# Patient Record
Sex: Female | Born: 1935
Health system: Southern US, Community
[De-identification: ages and names within clinical notes are randomized; demographics above are authoritative.]

## PROBLEM LIST (undated history)

## (undated) DIAGNOSIS — F32A Depression, unspecified: Secondary | ICD-10-CM

## (undated) DIAGNOSIS — E079 Disorder of thyroid, unspecified: Secondary | ICD-10-CM

## (undated) DIAGNOSIS — E119 Type 2 diabetes mellitus without complications: Secondary | ICD-10-CM

## (undated) DIAGNOSIS — E039 Hypothyroidism, unspecified: Secondary | ICD-10-CM

## (undated) DIAGNOSIS — C801 Malignant (primary) neoplasm, unspecified: Secondary | ICD-10-CM

## (undated) DIAGNOSIS — M25319 Other instability, unspecified shoulder: Secondary | ICD-10-CM

## (undated) DIAGNOSIS — E785 Hyperlipidemia, unspecified: Secondary | ICD-10-CM

## (undated) DIAGNOSIS — I739 Peripheral vascular disease, unspecified: Secondary | ICD-10-CM

## (undated) DIAGNOSIS — M1990 Unspecified osteoarthritis, unspecified site: Secondary | ICD-10-CM

## (undated) DIAGNOSIS — I10 Essential (primary) hypertension: Secondary | ICD-10-CM

## (undated) DIAGNOSIS — T82868A Thrombosis of vascular prosthetic devices, implants and grafts, initial encounter: Secondary | ICD-10-CM

## (undated) DIAGNOSIS — F329 Major depressive disorder, single episode, unspecified: Secondary | ICD-10-CM

## (undated) HISTORY — DX: Essential (primary) hypertension: I10

## (undated) HISTORY — DX: Depression, unspecified: F32.A

## (undated) HISTORY — PX: EYE SURGERY: SHX253

## (undated) HISTORY — DX: Malignant (primary) neoplasm, unspecified: C80.1

## (undated) HISTORY — DX: Unspecified osteoarthritis, unspecified site: M19.90

## (undated) HISTORY — DX: Major depressive disorder, single episode, unspecified: F32.9

## (undated) HISTORY — PX: COLOSTOMY: SHX63

## (undated) HISTORY — PX: OTHER SURGICAL HISTORY: SHX169

## (undated) HISTORY — PX: CHOLECYSTECTOMY: SHX55

## (undated) HISTORY — PX: ABDOMINAL HYSTERECTOMY: SHX81

## (undated) HISTORY — PX: COLON SURGERY: SHX602

## (undated) HISTORY — DX: Other instability, unspecified shoulder: M25.319

## (undated) HISTORY — DX: Disorder of thyroid, unspecified: E07.9

## (undated) HISTORY — DX: Type 2 diabetes mellitus without complications: E11.9

## (undated) HISTORY — DX: Peripheral vascular disease, unspecified: I73.9

## (undated) HISTORY — PX: CARPAL TUNNEL RELEASE: SHX101

## (undated) HISTORY — PX: APPENDECTOMY: SHX54

## (undated) HISTORY — PX: VESICOVAGINAL FISTULA CLOSURE W/ TAH: SUR271

## (undated) HISTORY — DX: Hyperlipidemia, unspecified: E78.5

## (undated) HISTORY — PX: TONSILLECTOMY: SUR1361

---

## 1999-06-16 ENCOUNTER — Ambulatory Visit (HOSPITAL_COMMUNITY): Admission: RE | Admit: 1999-06-16 | Discharge: 1999-06-16 | Payer: Self-pay | Admitting: Surgery

## 1999-06-16 ENCOUNTER — Encounter: Payer: Self-pay | Admitting: Surgery

## 1999-06-23 ENCOUNTER — Encounter: Payer: Self-pay | Admitting: Surgery

## 1999-06-27 ENCOUNTER — Encounter: Payer: Self-pay | Admitting: Surgery

## 1999-06-27 ENCOUNTER — Inpatient Hospital Stay (HOSPITAL_COMMUNITY): Admission: RE | Admit: 1999-06-27 | Discharge: 1999-06-29 | Payer: Self-pay | Admitting: Surgery

## 2001-09-08 ENCOUNTER — Encounter: Admission: RE | Admit: 2001-09-08 | Discharge: 2001-09-08 | Payer: Self-pay | Admitting: Family Medicine

## 2001-09-08 ENCOUNTER — Encounter: Payer: Self-pay | Admitting: Family Medicine

## 2001-11-25 ENCOUNTER — Emergency Department (HOSPITAL_COMMUNITY): Admission: EM | Admit: 2001-11-25 | Discharge: 2001-11-25 | Payer: Self-pay | Admitting: Emergency Medicine

## 2001-11-25 ENCOUNTER — Encounter: Payer: Self-pay | Admitting: Emergency Medicine

## 2002-03-05 ENCOUNTER — Encounter: Payer: Self-pay | Admitting: Internal Medicine

## 2002-03-05 ENCOUNTER — Ambulatory Visit (HOSPITAL_COMMUNITY): Admission: RE | Admit: 2002-03-05 | Discharge: 2002-03-05 | Payer: Self-pay | Admitting: Internal Medicine

## 2002-10-02 ENCOUNTER — Encounter (HOSPITAL_COMMUNITY): Admission: RE | Admit: 2002-10-02 | Discharge: 2002-11-01 | Payer: Self-pay | Admitting: Rheumatology

## 2002-10-02 ENCOUNTER — Encounter: Payer: Self-pay | Admitting: Rheumatology

## 2002-10-31 ENCOUNTER — Encounter: Payer: Self-pay | Admitting: Rheumatology

## 2002-10-31 ENCOUNTER — Encounter: Admission: RE | Admit: 2002-10-31 | Discharge: 2002-10-31 | Payer: Self-pay | Admitting: Rheumatology

## 2002-12-01 ENCOUNTER — Encounter (HOSPITAL_COMMUNITY): Admission: RE | Admit: 2002-12-01 | Discharge: 2002-12-31 | Payer: Self-pay | Admitting: Rheumatology

## 2003-01-06 ENCOUNTER — Encounter (HOSPITAL_COMMUNITY): Admission: RE | Admit: 2003-01-06 | Discharge: 2003-02-05 | Payer: Self-pay | Admitting: Rheumatology

## 2003-03-10 ENCOUNTER — Observation Stay (HOSPITAL_COMMUNITY): Admission: EM | Admit: 2003-03-10 | Discharge: 2003-03-11 | Payer: Self-pay | Admitting: Emergency Medicine

## 2003-03-10 ENCOUNTER — Encounter: Payer: Self-pay | Admitting: Emergency Medicine

## 2003-09-14 ENCOUNTER — Ambulatory Visit (HOSPITAL_COMMUNITY): Admission: RE | Admit: 2003-09-14 | Discharge: 2003-09-14 | Payer: Self-pay | Admitting: Internal Medicine

## 2003-11-13 ENCOUNTER — Ambulatory Visit (HOSPITAL_COMMUNITY): Admission: RE | Admit: 2003-11-13 | Discharge: 2003-11-13 | Payer: Self-pay | Admitting: Family Medicine

## 2003-11-19 ENCOUNTER — Encounter: Payer: Self-pay | Admitting: Orthopedic Surgery

## 2003-12-07 ENCOUNTER — Encounter: Admission: RE | Admit: 2003-12-07 | Discharge: 2003-12-07 | Payer: Self-pay | Admitting: Orthopedic Surgery

## 2004-01-11 ENCOUNTER — Ambulatory Visit (HOSPITAL_COMMUNITY): Admission: RE | Admit: 2004-01-11 | Discharge: 2004-01-11 | Payer: Self-pay | Admitting: Surgery

## 2004-01-21 ENCOUNTER — Inpatient Hospital Stay (HOSPITAL_COMMUNITY): Admission: RE | Admit: 2004-01-21 | Discharge: 2004-01-25 | Payer: Self-pay | Admitting: Surgery

## 2004-01-22 ENCOUNTER — Encounter (INDEPENDENT_AMBULATORY_CARE_PROVIDER_SITE_OTHER): Payer: Self-pay | Admitting: Cardiology

## 2004-01-29 ENCOUNTER — Inpatient Hospital Stay (HOSPITAL_COMMUNITY): Admission: EM | Admit: 2004-01-29 | Discharge: 2004-02-05 | Payer: Self-pay | Admitting: Emergency Medicine

## 2004-07-29 ENCOUNTER — Inpatient Hospital Stay (HOSPITAL_COMMUNITY): Admission: RE | Admit: 2004-07-29 | Discharge: 2004-07-31 | Payer: Self-pay | Admitting: Orthopedic Surgery

## 2004-07-29 ENCOUNTER — Encounter: Payer: Self-pay | Admitting: Orthopedic Surgery

## 2004-08-02 ENCOUNTER — Encounter (HOSPITAL_COMMUNITY): Admission: RE | Admit: 2004-08-02 | Discharge: 2004-09-02 | Payer: Self-pay | Admitting: Orthopedic Surgery

## 2004-09-07 ENCOUNTER — Encounter (HOSPITAL_COMMUNITY): Admission: RE | Admit: 2004-09-07 | Discharge: 2004-10-07 | Payer: Self-pay | Admitting: Orthopedic Surgery

## 2004-12-28 ENCOUNTER — Ambulatory Visit: Payer: Self-pay | Admitting: Orthopedic Surgery

## 2004-12-29 ENCOUNTER — Encounter: Payer: Self-pay | Admitting: Orthopedic Surgery

## 2005-05-11 ENCOUNTER — Ambulatory Visit (HOSPITAL_COMMUNITY): Admission: RE | Admit: 2005-05-11 | Discharge: 2005-05-11 | Payer: Self-pay | Admitting: Family Medicine

## 2005-06-30 ENCOUNTER — Observation Stay (HOSPITAL_COMMUNITY): Admission: AD | Admit: 2005-06-30 | Discharge: 2005-07-01 | Payer: Self-pay | Admitting: Family Medicine

## 2005-07-06 ENCOUNTER — Emergency Department (HOSPITAL_COMMUNITY): Admission: EM | Admit: 2005-07-06 | Discharge: 2005-07-06 | Payer: Self-pay | Admitting: Emergency Medicine

## 2005-07-25 ENCOUNTER — Encounter: Payer: Self-pay | Admitting: Family Medicine

## 2005-07-25 ENCOUNTER — Inpatient Hospital Stay (HOSPITAL_COMMUNITY): Admission: AD | Admit: 2005-07-25 | Discharge: 2005-08-03 | Payer: Self-pay | Admitting: Family Medicine

## 2005-08-02 ENCOUNTER — Encounter: Payer: Self-pay | Admitting: General Surgery

## 2005-08-02 ENCOUNTER — Encounter (INDEPENDENT_AMBULATORY_CARE_PROVIDER_SITE_OTHER): Payer: Self-pay | Admitting: *Deleted

## 2005-08-17 ENCOUNTER — Ambulatory Visit (HOSPITAL_COMMUNITY): Admission: RE | Admit: 2005-08-17 | Discharge: 2005-08-17 | Payer: Self-pay | Admitting: General Surgery

## 2005-08-28 ENCOUNTER — Inpatient Hospital Stay (HOSPITAL_COMMUNITY): Admission: AD | Admit: 2005-08-28 | Discharge: 2005-10-20 | Payer: Self-pay | Admitting: General Surgery

## 2005-08-29 ENCOUNTER — Encounter (INDEPENDENT_AMBULATORY_CARE_PROVIDER_SITE_OTHER): Payer: Self-pay | Admitting: General Surgery

## 2005-09-22 ENCOUNTER — Ambulatory Visit: Payer: Self-pay | Admitting: Oncology

## 2005-10-02 ENCOUNTER — Ambulatory Visit: Payer: Self-pay | Admitting: *Deleted

## 2005-10-05 ENCOUNTER — Ambulatory Visit: Payer: Self-pay | Admitting: Pulmonary Disease

## 2005-10-06 ENCOUNTER — Encounter: Payer: Self-pay | Admitting: Critical Care Medicine

## 2005-11-01 ENCOUNTER — Inpatient Hospital Stay (HOSPITAL_COMMUNITY): Admission: RE | Admit: 2005-11-01 | Discharge: 2005-11-11 | Payer: Self-pay | Admitting: General Surgery

## 2005-11-20 ENCOUNTER — Inpatient Hospital Stay (HOSPITAL_COMMUNITY): Admission: AD | Admit: 2005-11-20 | Discharge: 2005-12-06 | Payer: Self-pay | Admitting: General Surgery

## 2005-12-15 ENCOUNTER — Ambulatory Visit (HOSPITAL_COMMUNITY): Admission: RE | Admit: 2005-12-15 | Discharge: 2005-12-15 | Payer: Self-pay | Admitting: Family Medicine

## 2005-12-18 ENCOUNTER — Inpatient Hospital Stay (HOSPITAL_COMMUNITY): Admission: EM | Admit: 2005-12-18 | Discharge: 2005-12-26 | Payer: Self-pay | Admitting: Emergency Medicine

## 2005-12-18 ENCOUNTER — Ambulatory Visit: Payer: Self-pay | Admitting: *Deleted

## 2005-12-19 ENCOUNTER — Ambulatory Visit: Payer: Self-pay | Admitting: Oncology

## 2006-01-02 ENCOUNTER — Encounter: Admission: RE | Admit: 2006-01-02 | Discharge: 2006-01-02 | Payer: Self-pay | Admitting: Oncology

## 2006-01-02 ENCOUNTER — Ambulatory Visit (HOSPITAL_COMMUNITY): Payer: Self-pay | Admitting: Oncology

## 2006-01-02 ENCOUNTER — Encounter (HOSPITAL_COMMUNITY): Admission: RE | Admit: 2006-01-02 | Discharge: 2006-02-01 | Payer: Self-pay | Admitting: Oncology

## 2006-01-13 ENCOUNTER — Inpatient Hospital Stay (HOSPITAL_COMMUNITY): Admission: AD | Admit: 2006-01-13 | Discharge: 2006-01-15 | Payer: Self-pay | Admitting: Family Medicine

## 2006-02-07 ENCOUNTER — Encounter (HOSPITAL_COMMUNITY): Admission: RE | Admit: 2006-02-07 | Discharge: 2006-03-09 | Payer: Self-pay | Admitting: Oncology

## 2006-02-07 ENCOUNTER — Encounter: Admission: RE | Admit: 2006-02-07 | Discharge: 2006-02-07 | Payer: Self-pay | Admitting: Oncology

## 2006-02-21 ENCOUNTER — Ambulatory Visit (HOSPITAL_COMMUNITY): Payer: Self-pay | Admitting: Oncology

## 2006-03-14 ENCOUNTER — Encounter: Admission: RE | Admit: 2006-03-14 | Discharge: 2006-03-14 | Payer: Self-pay | Admitting: Oncology

## 2006-03-14 ENCOUNTER — Encounter (HOSPITAL_COMMUNITY): Admission: RE | Admit: 2006-03-14 | Discharge: 2006-04-13 | Payer: Self-pay | Admitting: Oncology

## 2006-04-11 ENCOUNTER — Ambulatory Visit (HOSPITAL_COMMUNITY): Payer: Self-pay | Admitting: Oncology

## 2006-05-09 ENCOUNTER — Encounter: Admission: RE | Admit: 2006-05-09 | Discharge: 2006-05-09 | Payer: Self-pay | Admitting: Oncology

## 2006-05-09 ENCOUNTER — Encounter (HOSPITAL_COMMUNITY): Admission: RE | Admit: 2006-05-09 | Discharge: 2006-06-08 | Payer: Self-pay | Admitting: Oncology

## 2006-05-18 ENCOUNTER — Ambulatory Visit (HOSPITAL_COMMUNITY): Admission: RE | Admit: 2006-05-18 | Discharge: 2006-05-18 | Payer: Self-pay | Admitting: General Surgery

## 2006-06-07 ENCOUNTER — Ambulatory Visit (HOSPITAL_COMMUNITY): Payer: Self-pay | Admitting: Oncology

## 2006-07-19 ENCOUNTER — Encounter (HOSPITAL_COMMUNITY): Admission: RE | Admit: 2006-07-19 | Discharge: 2006-08-18 | Payer: Self-pay | Admitting: Oncology

## 2006-07-19 ENCOUNTER — Encounter: Admission: RE | Admit: 2006-07-19 | Discharge: 2006-07-19 | Payer: Self-pay | Admitting: Oncology

## 2006-08-30 ENCOUNTER — Encounter (HOSPITAL_COMMUNITY): Admission: RE | Admit: 2006-08-30 | Discharge: 2006-09-01 | Payer: Self-pay | Admitting: Oncology

## 2006-08-30 ENCOUNTER — Ambulatory Visit (HOSPITAL_COMMUNITY): Payer: Self-pay | Admitting: Oncology

## 2006-08-30 ENCOUNTER — Encounter: Admission: RE | Admit: 2006-08-30 | Discharge: 2006-09-01 | Payer: Self-pay | Admitting: Oncology

## 2006-08-31 ENCOUNTER — Ambulatory Visit (HOSPITAL_COMMUNITY): Admission: RE | Admit: 2006-08-31 | Discharge: 2006-08-31 | Payer: Self-pay | Admitting: General Surgery

## 2006-09-27 ENCOUNTER — Encounter (HOSPITAL_COMMUNITY): Admission: RE | Admit: 2006-09-27 | Discharge: 2006-10-27 | Payer: Self-pay | Admitting: Oncology

## 2006-09-27 ENCOUNTER — Encounter: Admission: RE | Admit: 2006-09-27 | Discharge: 2006-09-27 | Payer: Self-pay | Admitting: Oncology

## 2006-10-23 ENCOUNTER — Ambulatory Visit (HOSPITAL_COMMUNITY): Payer: Self-pay | Admitting: Oncology

## 2006-11-28 ENCOUNTER — Encounter (HOSPITAL_COMMUNITY): Admission: RE | Admit: 2006-11-28 | Discharge: 2006-12-03 | Payer: Self-pay | Admitting: Oncology

## 2007-03-15 IMAGING — CR DG ABDOMEN 2V
2 series · 2 of 2 positions shown · non-contrast
Comparison: none

CLINICAL DATA: Abdominal pain. 
 ABDOMEN ? 2 VIEW:
 The bowel gas pattern is normal.  There is no evidence of dilated bowel loops or air-fluid levels.  There is no evidence of free intraperitoneal air.  Surgical clips are seen in the right upper quadrant from prior cholecystectomy.

[view not recorded (1 of 2)]
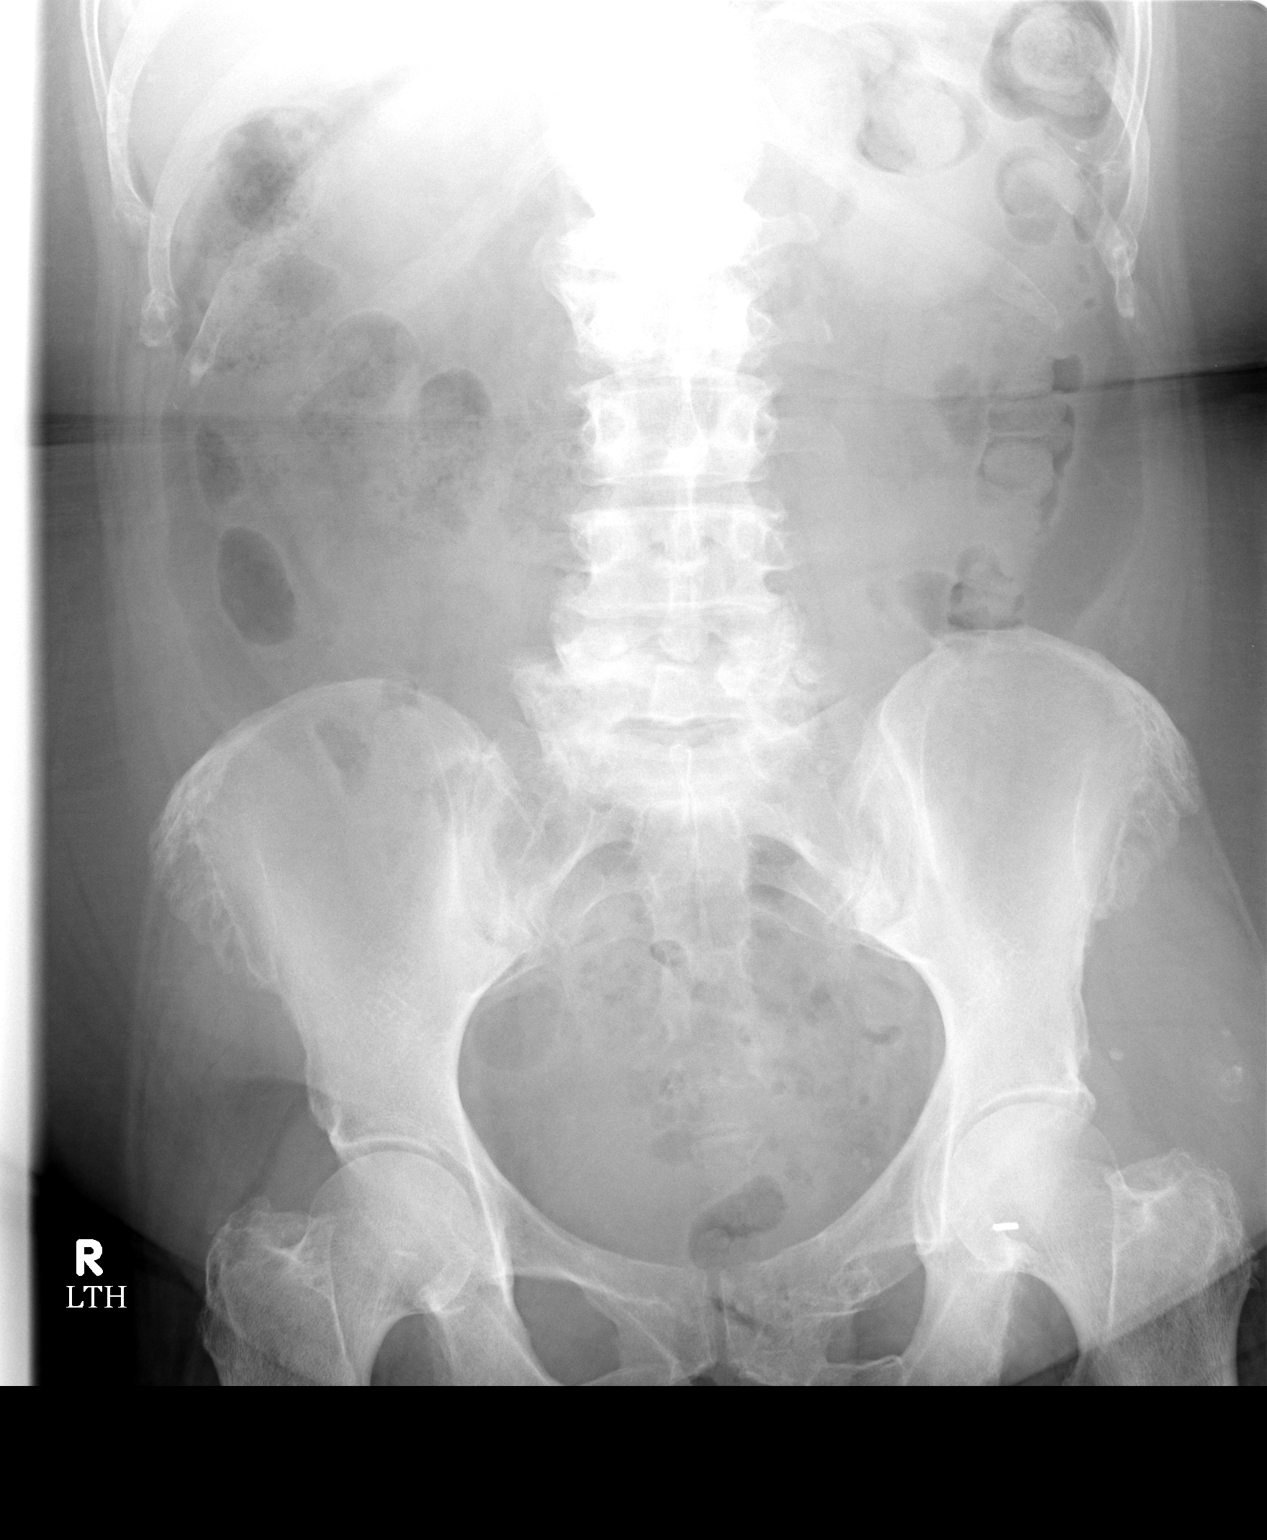

[view not recorded (2 of 2)]
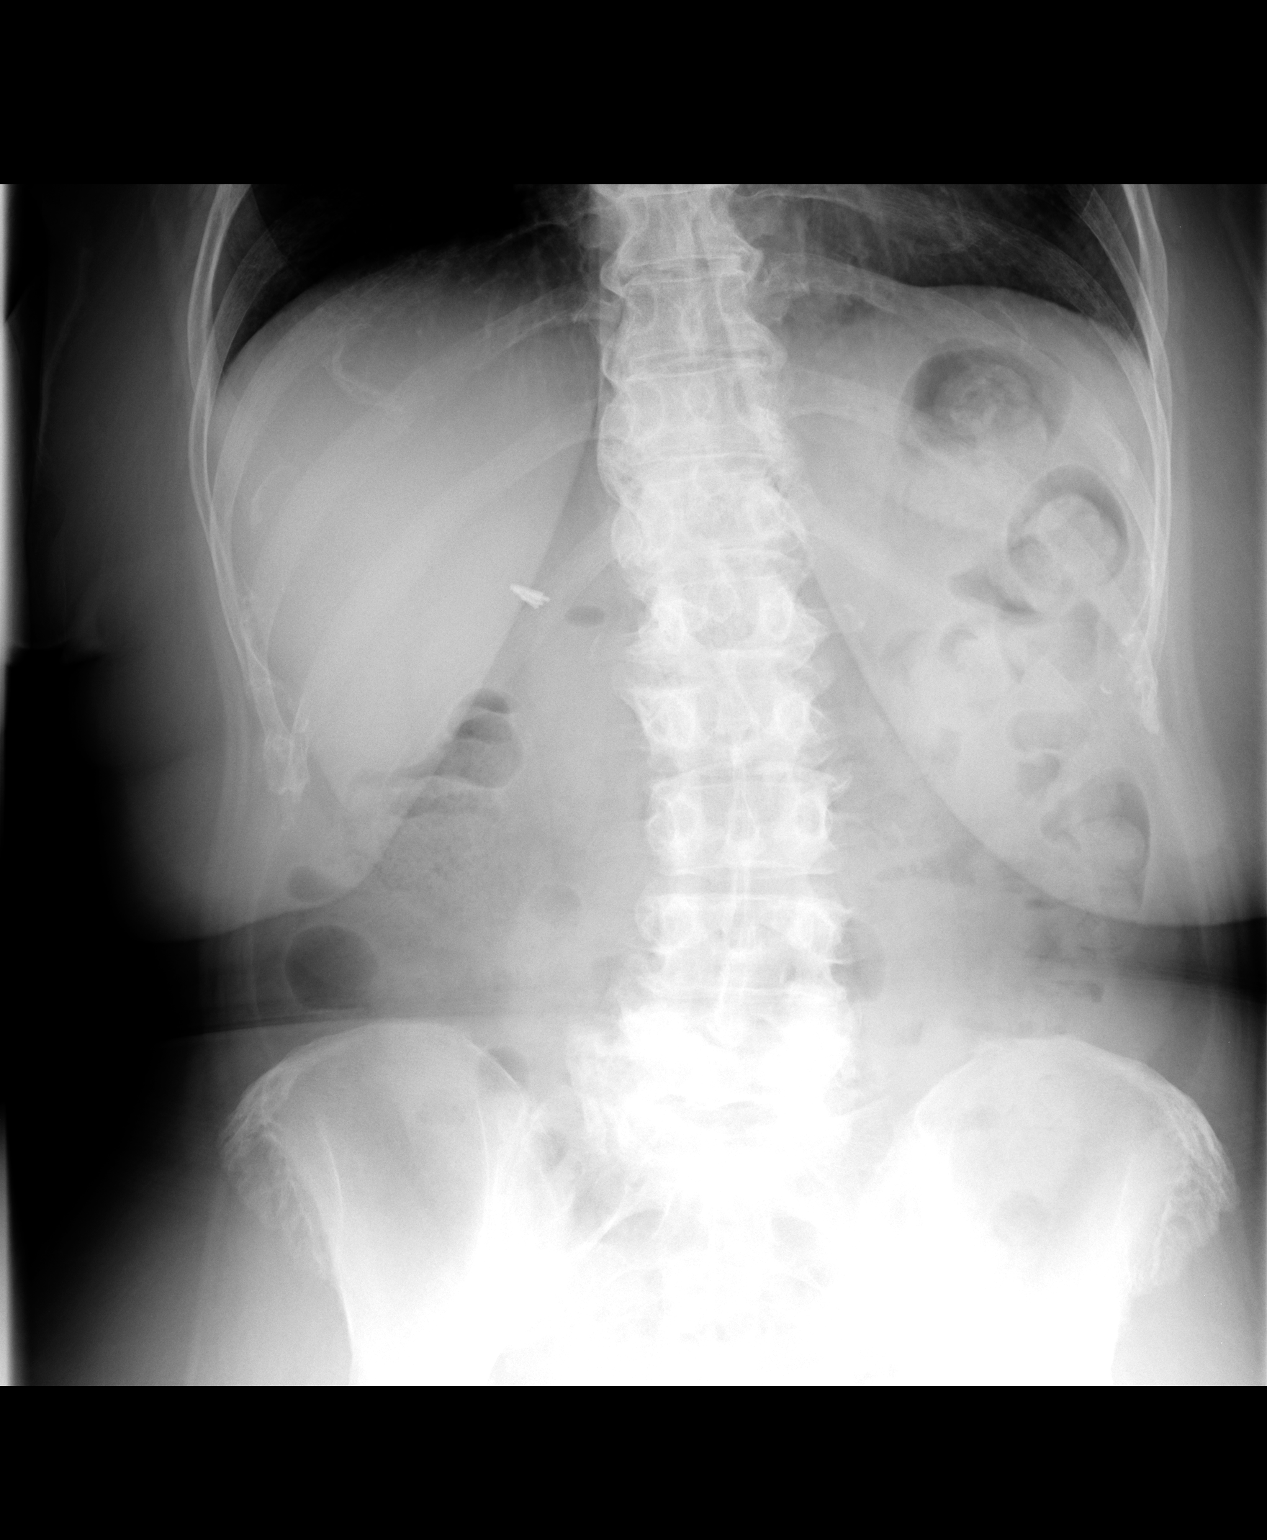

[2 of 2 positions shown; findings below may reference images not displayed]

IMPRESSION: No acute findings.

## 2007-03-28 ENCOUNTER — Ambulatory Visit (HOSPITAL_COMMUNITY): Payer: Self-pay | Admitting: Oncology

## 2007-03-28 ENCOUNTER — Encounter (HOSPITAL_COMMUNITY): Admission: RE | Admit: 2007-03-28 | Discharge: 2007-04-27 | Payer: Self-pay | Admitting: Oncology

## 2007-05-26 IMAGING — CR DG CHEST 1V PORT
1 series · 1 of 1 positions shown · non-contrast
Comparison: 09/09/05.

CLINICAL DATA: Rule out infiltrate. 
 PORTABLE CHEST ? 1 VIEW:

[view not recorded]
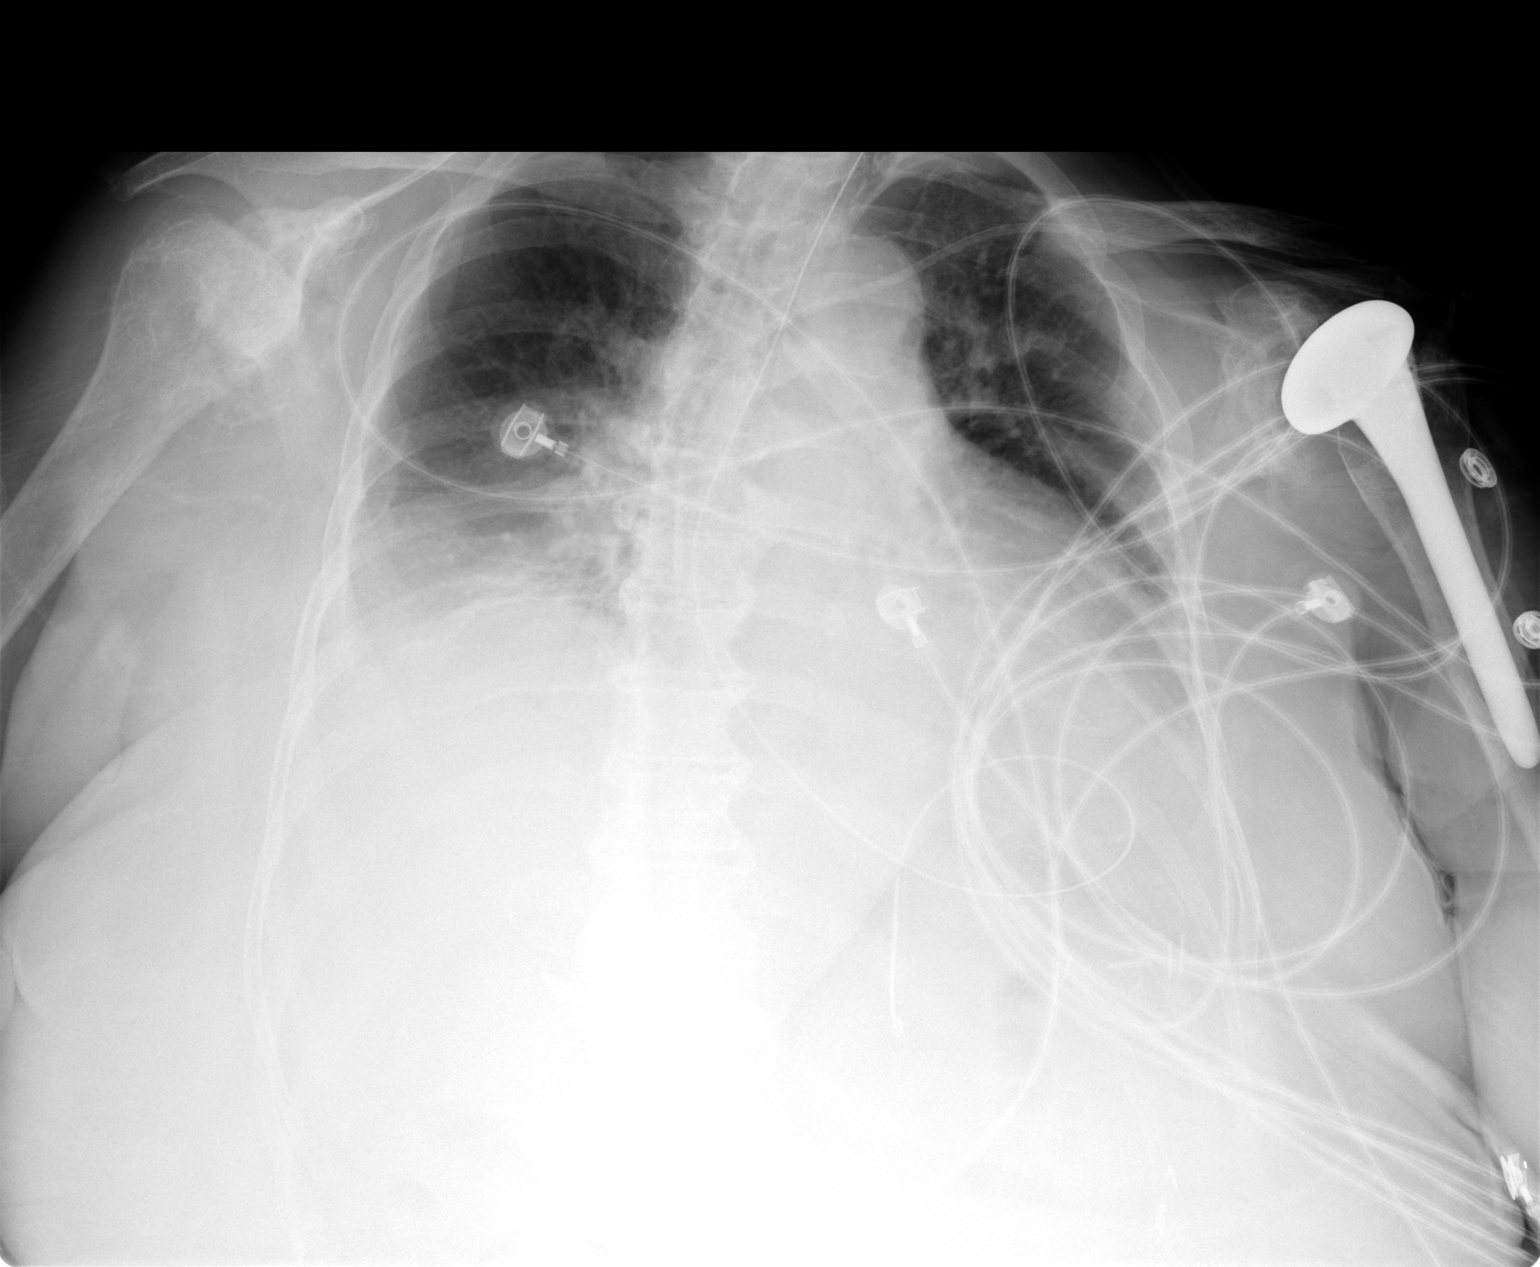

[1 of 1 positions shown; findings below may reference images not displayed]

FINDINGS: NG tube is coiled within the stomach.  There is a left subclavian line with tip in the projection of the cavoatrial junction/right atrium.  
 There is cardiomegaly.  Blunting of left costophrenic angle may be due to an effusion.  There is prominent bibasilar atelectasis.
IMPRESSION: 1.  Cardiomegaly and left effusion. 
 2.  Prominent bibasilar atelectasis.
 3.  Left subclavian central venous catheter tip at cavoatrial junction or right atrium.

## 2007-05-26 IMAGING — CR DG ABDOMEN 1V
2 series · 2 of 2 positions shown · non-contrast
Comparison: 09/09/05.

CLINICAL DATA: Nausea, vomiting, and diverticulitis.  Evaluate for ileus.
 ABDOMEN ? 1 VIEW:

[view not recorded (1 of 2)]
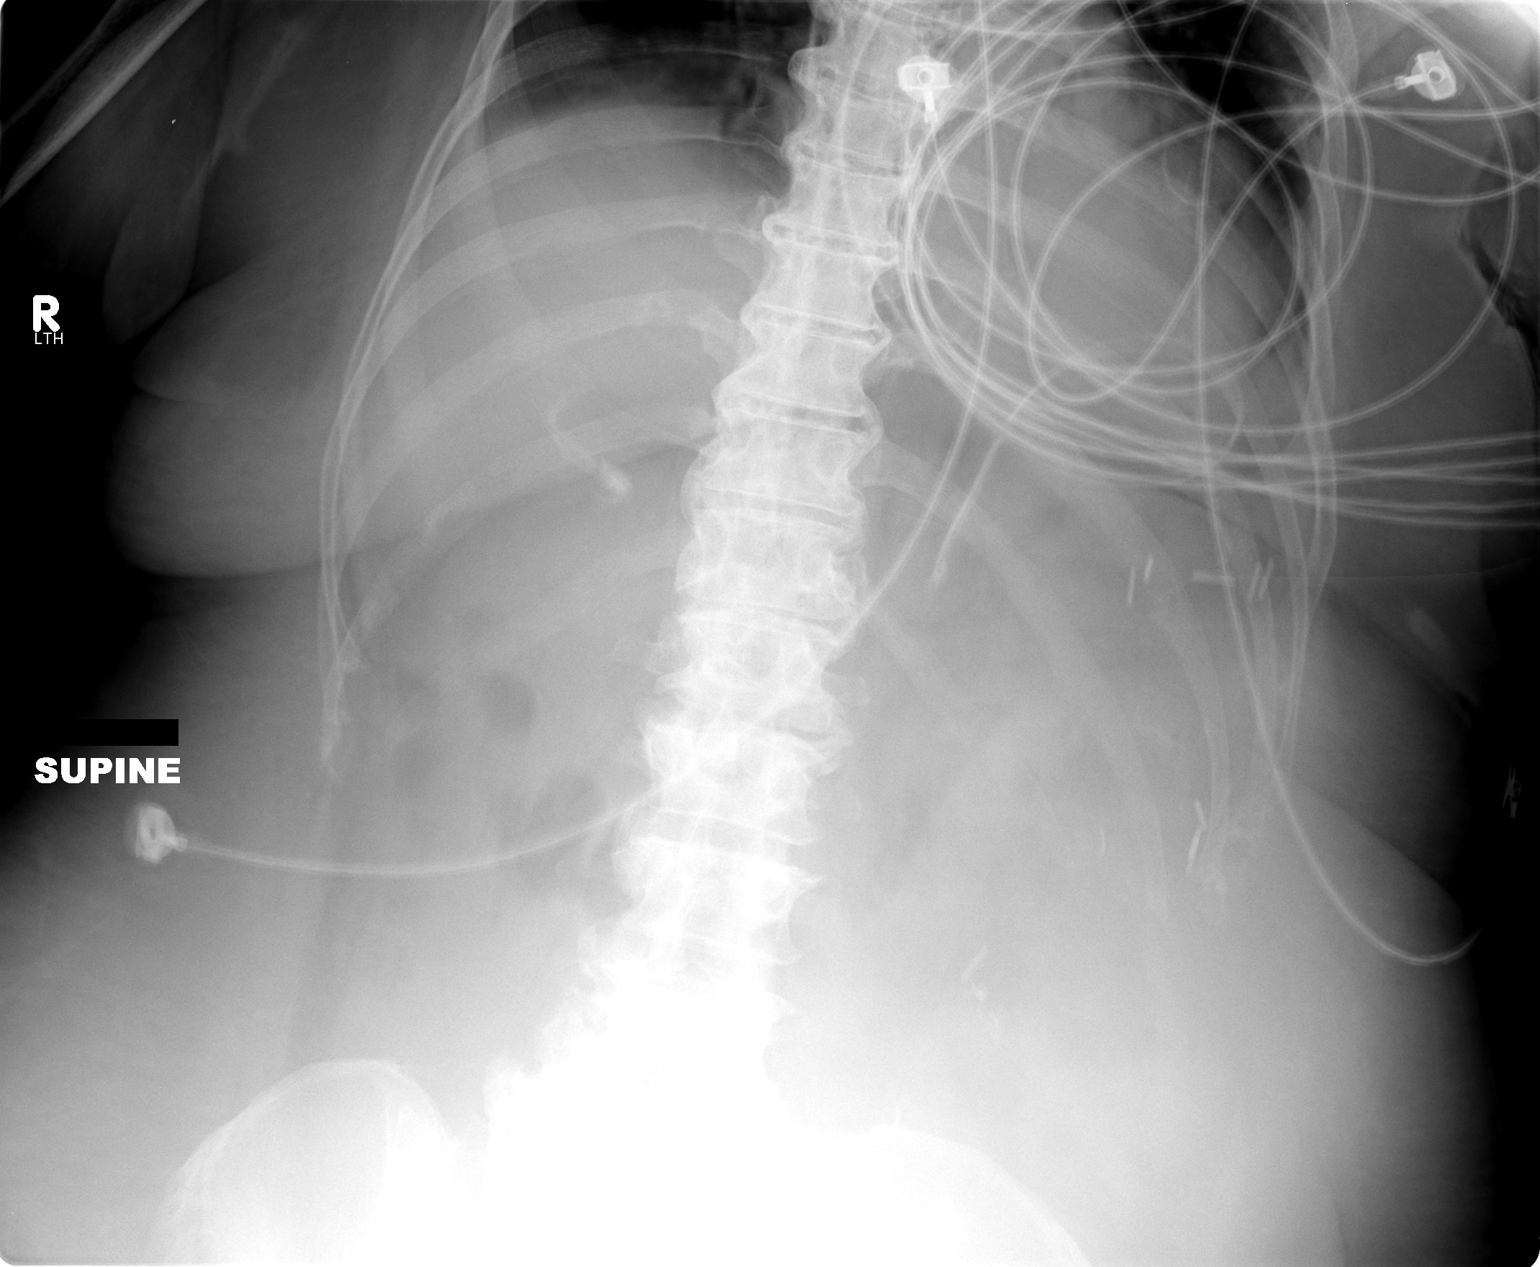

[view not recorded (2 of 2)]
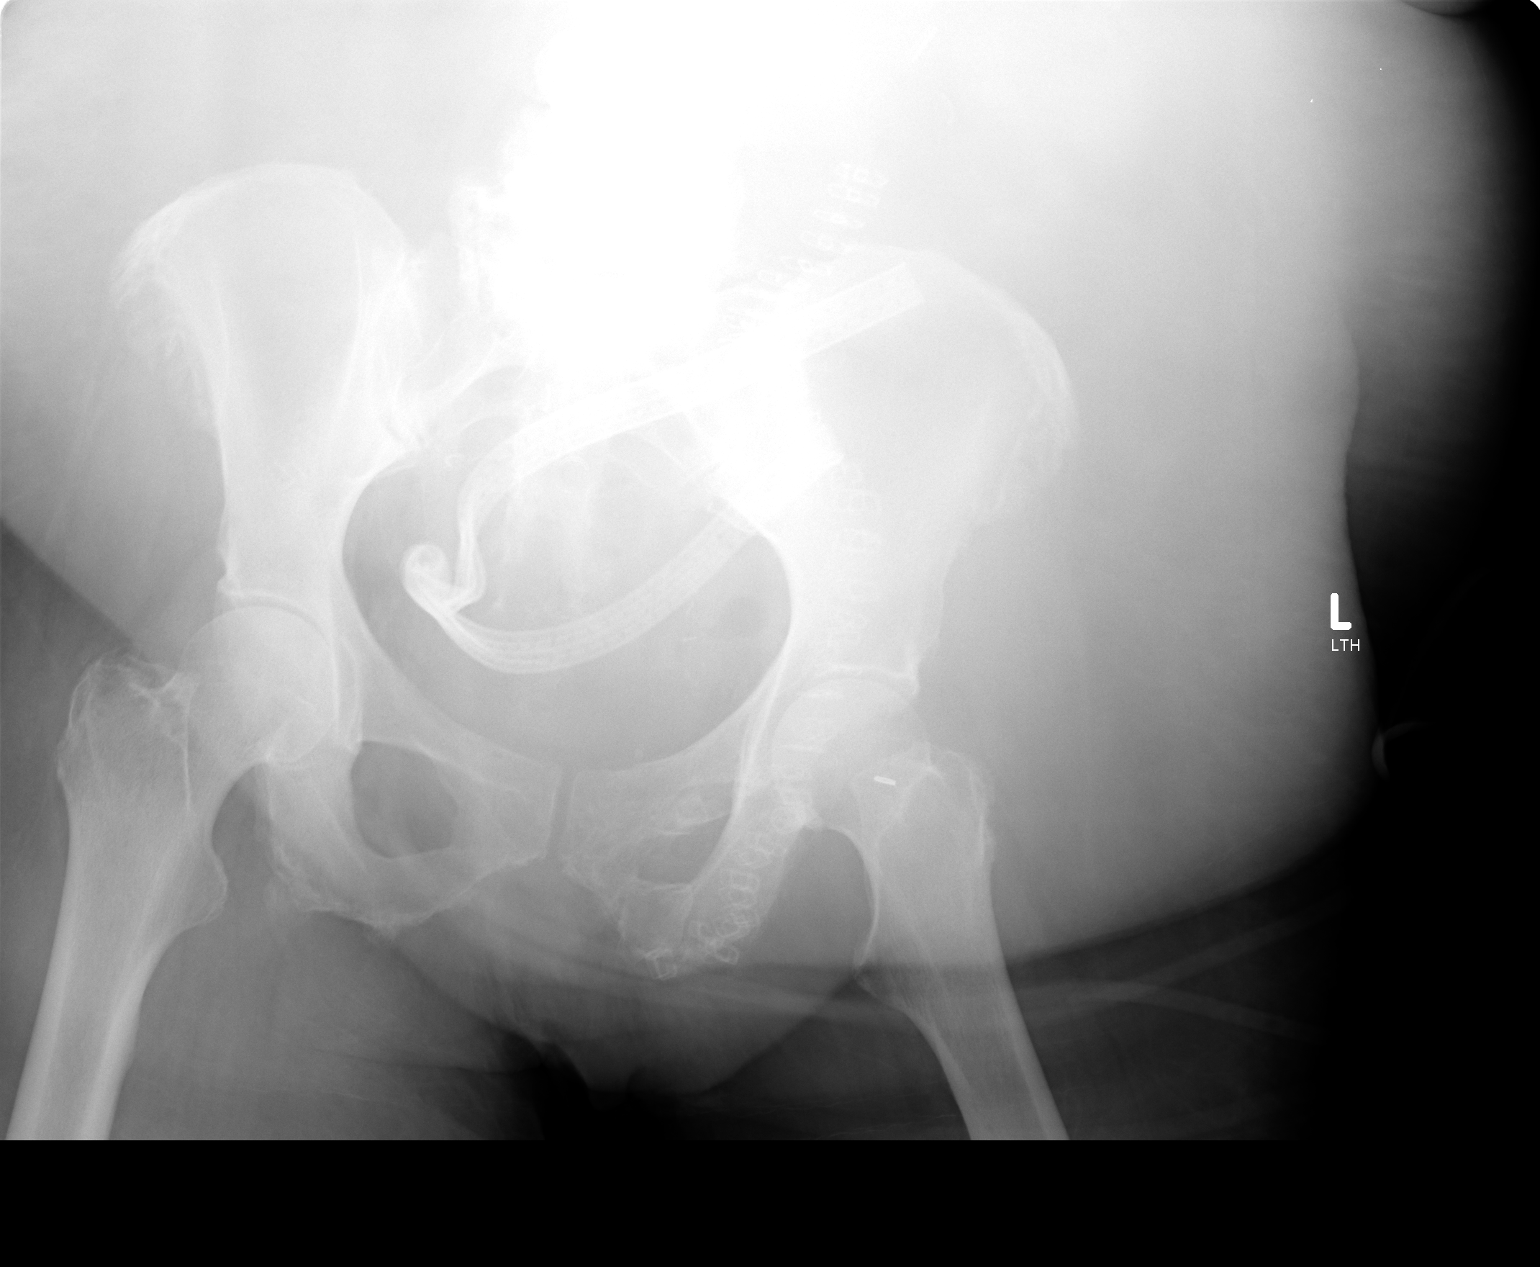

[2 of 2 positions shown; findings below may reference images not displayed]

FINDINGS: There is a surgical drain in the projection of the pelvis.  Numerous surgical clips are overlying the left lower quadrant.  There is a paucity of bowel gas which limits evaluation.  No evidence for small or large bowel ileus is noted.  A nasogastric tube is identified coiled within the body of the stomach.
IMPRESSION: No bowel obstruction.  No evidence for free air.  Surgical drain stable in the left lower quadrant/pelvis.

## 2007-11-05 ENCOUNTER — Encounter (HOSPITAL_BASED_OUTPATIENT_CLINIC_OR_DEPARTMENT_OTHER): Admission: RE | Admit: 2007-11-05 | Discharge: 2007-12-10 | Payer: Self-pay | Admitting: Surgery

## 2007-12-18 ENCOUNTER — Ambulatory Visit (HOSPITAL_COMMUNITY): Admission: RE | Admit: 2007-12-18 | Discharge: 2007-12-18 | Payer: Self-pay | Admitting: Family Medicine

## 2007-12-21 ENCOUNTER — Emergency Department (HOSPITAL_COMMUNITY): Admission: EM | Admit: 2007-12-21 | Discharge: 2007-12-21 | Payer: Self-pay | Admitting: Emergency Medicine

## 2007-12-24 DIAGNOSIS — Z8679 Personal history of other diseases of the circulatory system: Secondary | ICD-10-CM | POA: Insufficient documentation

## 2007-12-25 ENCOUNTER — Telehealth: Payer: Self-pay | Admitting: Orthopedic Surgery

## 2007-12-25 ENCOUNTER — Ambulatory Visit: Payer: Self-pay | Admitting: Orthopedic Surgery

## 2007-12-25 DIAGNOSIS — M1710 Unilateral primary osteoarthritis, unspecified knee: Secondary | ICD-10-CM

## 2007-12-25 DIAGNOSIS — IMO0002 Reserved for concepts with insufficient information to code with codable children: Secondary | ICD-10-CM | POA: Insufficient documentation

## 2007-12-25 DIAGNOSIS — M25569 Pain in unspecified knee: Secondary | ICD-10-CM | POA: Insufficient documentation

## 2007-12-26 ENCOUNTER — Encounter: Payer: Self-pay | Admitting: Orthopedic Surgery

## 2008-06-24 ENCOUNTER — Encounter: Payer: Self-pay | Admitting: Orthopedic Surgery

## 2008-07-30 ENCOUNTER — Telehealth: Payer: Self-pay | Admitting: Orthopedic Surgery

## 2008-12-18 ENCOUNTER — Encounter: Admission: RE | Admit: 2008-12-18 | Discharge: 2008-12-18 | Payer: Self-pay | Admitting: Orthopedic Surgery

## 2009-01-07 ENCOUNTER — Encounter: Payer: Self-pay | Admitting: Emergency Medicine

## 2009-01-07 ENCOUNTER — Inpatient Hospital Stay (HOSPITAL_COMMUNITY): Admission: EM | Admit: 2009-01-07 | Discharge: 2009-01-11 | Payer: Self-pay | Admitting: Internal Medicine

## 2009-01-07 ENCOUNTER — Ambulatory Visit: Payer: Self-pay | Admitting: Vascular Surgery

## 2009-01-07 ENCOUNTER — Encounter: Payer: Self-pay | Admitting: Vascular Surgery

## 2009-01-08 ENCOUNTER — Encounter: Payer: Self-pay | Admitting: Vascular Surgery

## 2009-01-20 ENCOUNTER — Ambulatory Visit: Payer: Self-pay | Admitting: Vascular Surgery

## 2009-01-27 ENCOUNTER — Ambulatory Visit: Payer: Self-pay | Admitting: Vascular Surgery

## 2009-02-03 ENCOUNTER — Ambulatory Visit: Payer: Self-pay | Admitting: Vascular Surgery

## 2009-02-11 ENCOUNTER — Emergency Department (HOSPITAL_COMMUNITY): Admission: EM | Admit: 2009-02-11 | Discharge: 2009-02-11 | Payer: Self-pay | Admitting: Emergency Medicine

## 2009-02-27 ENCOUNTER — Emergency Department (HOSPITAL_COMMUNITY): Admission: EM | Admit: 2009-02-27 | Discharge: 2009-02-27 | Payer: Self-pay | Admitting: Emergency Medicine

## 2009-04-07 ENCOUNTER — Ambulatory Visit: Payer: Self-pay | Admitting: Vascular Surgery

## 2009-04-14 ENCOUNTER — Ambulatory Visit: Payer: Self-pay | Admitting: Vascular Surgery

## 2009-04-20 ENCOUNTER — Ambulatory Visit (HOSPITAL_COMMUNITY): Admission: RE | Admit: 2009-04-20 | Discharge: 2009-04-20 | Payer: Self-pay | Admitting: Surgery

## 2009-04-20 ENCOUNTER — Ambulatory Visit: Payer: Self-pay | Admitting: Surgery

## 2009-05-12 ENCOUNTER — Ambulatory Visit: Payer: Self-pay | Admitting: Vascular Surgery

## 2009-07-14 ENCOUNTER — Ambulatory Visit: Payer: Self-pay | Admitting: Vascular Surgery

## 2009-08-06 ENCOUNTER — Ambulatory Visit (HOSPITAL_COMMUNITY): Admission: RE | Admit: 2009-08-06 | Discharge: 2009-08-06 | Payer: Self-pay | Admitting: Vascular Surgery

## 2009-08-11 ENCOUNTER — Ambulatory Visit: Payer: Self-pay | Admitting: Vascular Surgery

## 2009-10-06 ENCOUNTER — Ambulatory Visit: Payer: Self-pay | Admitting: Vascular Surgery

## 2009-10-20 ENCOUNTER — Ambulatory Visit: Payer: Self-pay | Admitting: Vascular Surgery

## 2010-02-09 ENCOUNTER — Ambulatory Visit: Payer: Self-pay | Admitting: Vascular Surgery

## 2010-04-06 ENCOUNTER — Ambulatory Visit: Payer: Self-pay | Admitting: Orthopedic Surgery

## 2010-04-06 DIAGNOSIS — M2350 Chronic instability of knee, unspecified knee: Secondary | ICD-10-CM | POA: Insufficient documentation

## 2010-04-11 ENCOUNTER — Ambulatory Visit: Payer: Self-pay | Admitting: Orthopedic Surgery

## 2010-04-11 DIAGNOSIS — Z96619 Presence of unspecified artificial shoulder joint: Secondary | ICD-10-CM | POA: Insufficient documentation

## 2010-04-11 DIAGNOSIS — M25519 Pain in unspecified shoulder: Secondary | ICD-10-CM | POA: Insufficient documentation

## 2010-04-11 DIAGNOSIS — M25819 Other specified joint disorders, unspecified shoulder: Secondary | ICD-10-CM | POA: Insufficient documentation

## 2010-04-11 DIAGNOSIS — M7580 Other shoulder lesions, unspecified shoulder: Secondary | ICD-10-CM

## 2010-05-31 ENCOUNTER — Ambulatory Visit: Payer: Self-pay | Admitting: Orthopedic Surgery

## 2010-06-03 ENCOUNTER — Ambulatory Visit: Payer: Self-pay | Admitting: Vascular Surgery

## 2010-09-01 ENCOUNTER — Ambulatory Visit: Payer: Self-pay | Admitting: Orthopedic Surgery

## 2010-12-13 ENCOUNTER — Ambulatory Visit
Admission: RE | Admit: 2010-12-13 | Discharge: 2010-12-13 | Payer: Self-pay | Source: Home / Self Care | Attending: Orthopedic Surgery | Admitting: Orthopedic Surgery

## 2010-12-14 ENCOUNTER — Ambulatory Visit
Admission: RE | Admit: 2010-12-14 | Discharge: 2010-12-14 | Payer: Self-pay | Source: Home / Self Care | Attending: Vascular Surgery | Admitting: Vascular Surgery

## 2010-12-22 ENCOUNTER — Ambulatory Visit
Admission: RE | Admit: 2010-12-22 | Discharge: 2010-12-22 | Payer: Self-pay | Source: Home / Self Care | Attending: Vascular Surgery | Admitting: Vascular Surgery

## 2010-12-23 NOTE — Assessment & Plan Note (Signed)
OFFICE VISIT  [name], [name] DOB:  Jul 01, 1926                                       12/22/2010 [id number]  The patient returns for followup today.  She has previously undergone multiple revascularizations of both lower extremities.  She previously had a left fem-pop bypass done by Dr. Nils Pyle several years ago and then recently underwent thrombectomy of her left external iliac artery and redo left femoral to below knee popliteal bypass with a vein graft in February of 2010.  She also had an angioplasty of this in May of 2010. She has also previously had a right fem-pop bypass with primary runoff via the anterior tibial artery.  She returns today for further followup.  She states that intermittently she has some left leg numbness but this is intermittent in nature and not really bothersome to her.  She states her right leg is doing fine. She is minimally ambulatory and only moves about the house some.  She has no claudication symptoms.  She has no rest pain.  She has no history of nonhealing ulcerations recently.  REVIEW OF SYSTEMS:  She has had some recent weight loss. MUSCULOSKELETAL:  She has multiple joint arthritis.  CHRONIC MEDICAL PROBLEMS:  Include diabetes, hypertension, elevated cholesterol and these are currently followed by her primary physician.  MEDICATIONS:  Include diltiazem, Synthroid, simvastatin, Lodine, iron, multivitamin.  ALLERGIES:  She has no known drug allergies.  PHYSICAL EXAM:  Vital signs:  Blood pressure is 138/79 in the left arm, heart rate 49 and regular, respirations 20.  Lower extremities:  She has 2+ femoral pulses bilaterally.  She has 2+ popliteal pulses bilaterally. She has absent pedal pulses bilaterally.  She has no open ulcers on the feet.  I reviewed her noninvasive vascular exam which showed patent bypass grafts bilaterally with possibly some progressive narrowing of the posterior tibial artery on  the right side.  However, her symptoms have essentially not changed and she is minimal ambulatory without claudication symptoms and widely patent grafts.  I believe at this point continued observation would be best and we will schedule her for continued noninvasive surveillance of her bypass grafts.    [name], MD Electronically Signed  CEF/MEDQ  D:  12/22/2010  T:  12/23/2010  Job:  [id number]  cc:   Lucianne Lei, M.D.

## 2010-12-24 NOTE — Procedures (Unsigned)
BYPASS GRAFT EVALUATION  INDICATION:  Followup left fem pop graft  HISTORY: Diabetes:  yes Cardiac:  no Hypertension:  yes Smoking:  no Previous Surgery:  Left iliac thrombectomy and fem pop graft in 01/07/2009; right fem pop graft in 2005 by Dr. Nils Pyle  SINGLE LEVEL ARTERIAL EXAM                              RIGHT              LEFT Brachial:                    160                167 Anterior tibial:             147                129 Posterior tibial:            64                 133 Peroneal: Ankle/brachial index:        0.88 (AT), 0.38 (PT)                   0.80  PREVIOUS ABI:  Date: 02/09/2010  RIGHT:  1.01  LEFT:  1.0  LOWER EXTREMITY BYPASS GRAFT DUPLEX EXAM:  DUPLEX:  Left fem pop graft patent with triphasic and biphasic waveforms. Diffuse disease observed in the inflow and outflow arteries. In view of decreased ankle brachial indices, the right graft was also evaluated and found to be patent with triphasic and biphasic waveforms. There is a hemodynamically significant stenosis at the right PT trunk. Please see attached worksheet for details and velocities.  IMPRESSION:  Patent bilateral femoral popiteal grafts as described above.  ___________________________________________ [name], MD  LT/MEDQ  D:  12/14/2010  T:  12/14/2010  Job:  [id number]

## 2010-12-25 ENCOUNTER — Encounter: Payer: Self-pay | Admitting: General Surgery

## 2010-12-26 ENCOUNTER — Encounter: Payer: Self-pay | Admitting: Vascular Surgery

## 2011-01-03 NOTE — Assessment & Plan Note (Signed)
Summary: NEW PROBLEM RT KNEE PAIN NEEDS XR/EVERCARE/BSF   Visit Type:  new problem  CC:  right knee pain.  History of Present Illness: I saw [facility] [name] in the office today for a followup visit.  She is a 77 years old woman with the complaint of:  new problem, right knee pain.  pain is posterior in the back of the knee, it is unrelieved by Tylenol, very mild/moderate, duration several months, the patient jumped and felt acute pain in the back of the knee up into that point had been doing fine. She does have a history previous arthritis and was treated for that in the past. Associated symptoms. No swelling. No catching or locking.  Xrays today.  Medications: Bisoprol HCTZ, Synthroid 75 MCH, Diltiazem 30mg , One a day vitamins for women.  c/o pain in left shoulder s/p left hemiarthroplasty   Allergies (verified): No Known Drug Allergies  Past History:  Past Medical History: Last updated: 12/24/2007 High Blood Pressure  Past Surgical History: HYSTERECTOMY RIGHT CARPAL TUNNEL RELEASE BY GRAFTS RIGHT & LEFT LOWER EXTREMITY TONSILLECTOMY CHOLECYSTECTOMY LEFT SHOULDER HEMIARTHROPLASTY Appendectomy  Review of Systems Respiratory:  Complains of couch; denies short of breath, wheezing, tightness, pain on inspiration, and snoring . Musculoskeletal:  Complains of joint pain and muscle pain; denies swelling, instability, stiffness, redness, and heat.  The review of systems is negative for Constitutional, Cardiovascular, Gastrointestinal, Genitourinary, Neurologic, Endocrine, Psychiatric, Skin, HEENT, Immunology, and Hemoatologic.  Physical Exam  Additional Exam:  Constitutional: normal appearance , normal grooming, and hygiene  CDV: normal pulse and perfusion, no peripheral edema  Skin: normal  Neuro: normal sensation  Psyche: awake and alert, mood is normal  MSK:  RIGHT knee. Her RIGHT knee does not come to full extension. She has approximately 10 flexion contracture,  flexion is about 120. Her knee is stable. She has tenderness in the posterior portion and the popliteal fossa. There is negative. Straight leg raise. Strength is normal. Knee is stable. Anteriorly there is mild tenderness over the mediolateral joint lines.  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 There is mild to moderate arthritis with mild to moderate joint space narrowing.  Marginal osteophytes consistent with osteoarthritis  Orders: Est. Patient Level IV [id number]) Knee x-ray,  3 views [id number])  Problem # 2:  KNEE PAIN [id number]) Assessment: New  Her updated medication list for this problem includes:    Norco 5-325 Mg Tabs (Hydrocodone-acetaminophen) .Marland Kitchen... 1 -2 q 4 as needed pain  Orders: Est. Patient Level IV [id number]) Knee x-ray,  3 views HH:[id number])  Medications Added to Medication List This Visit: 1)  Norco 5-325 Mg Tabs (Hydrocodone-acetaminophen) .Marland Kitchen.. 1 -2 q 4 as needed pain  Patient Instructions: 1)  Shoulder films left ASAP 2)  Sprain ligament right knee (take norco for pain) Prescriptions: NORCO 5-325 MG TABS (HYDROCODONE-ACETAMINOPHEN) 1 -2 q 4 as needed pain  #84 x 2   Entered and Authorized by:   Arther Abbott MD   Signed by:   Arther Abbott MD on 04/06/2010   Method used:   Print then Give to Patient   RxID:   [id number]

## 2011-01-03 NOTE — Assessment & Plan Note (Signed)
Summary: CHECK LT SHOULDER/NEEDS XRAYS/EVERCARE,MEDICAID/CAF   Visit Type:  Follow-up  CC:  left shoulder pain;.  History of Present Illness: I saw [name] in the office today for a followup visit.  She is a 77 years old woman with the complaint of:  left shoulder pain.   She is status post LEFT partial shoulder replacement in 2005 complains of pain in the LEFT shoulder with stiffness for 7 months.  She notices that her activities of daily living have become more difficult.  Her pain is unrelieved by Norco 5 mg  review of systems her knee seems to be stable with the Norco 5 mg  Painful forward elevation is noted in the LEFT shoulder  Exam she is awake alert and oriented x3 her mood and affect are normal.  Appearance: Well-groomed no deformities  LEFT shoulder active range of motion flexion 80 external rotation 30 internal rotation and sacrum passive range of motion the same has a positive impingement sign at 90 shoulders stable weakness is noted in the rotator cuff in the supraspinatus, internal/external rotators normal, skin incision normal pulse good sensation in the LEFT upper extremity normal.  3 views of the LEFT shoulder AP lateral and Y. show no loosening, normal glenohumeral articulation.  No erosion.  No proximal migration.  Impression normal shoulder with prosthesis hemiarthroplasty  Recommend subacromial injection  Allergies: No Known Drug Allergies   Impression & Recommendations:  Problem # 1:  IMPINGEMENT SYNDROME (ICD-726.2) Assessment New  Verbal consent obtained/The shoulder was injected with depomedrol 40mg /cc and sensorcaine .25% . There were no complications inject LEFT shoulder  Orders: Est. Patient Level III [id number]) Depo- Medrol 40mg  (J1030) Joint Aspirate / Injection, Large (20610) Shoulder x-ray,  minimum 2 views [id number])  Problem # 2:  SHOULDER PAIN (ICD-719.41) Assessment: New  Her updated medication list for this problem includes:   Norco 5-325 Mg Tabs (Hydrocodone-acetaminophen) .Marland Kitchen... 1 -2 q 4 as needed pain  Orders: Est. Patient Level III [id number]) Depo- Medrol 40mg  (J1030) Joint Aspirate / Injection, Large [id number]) Shoulder x-ray,  minimum 2 views [id number])  Problem # 3:  SHOULDER JOINT REPLACEMENT BY OTHER MEANS (ICD-V43.61) Assessment: Comment Only  stable replacement  Orders: Est. Patient Level III [id number]) Shoulder x-ray,  minimum 2 views [id number])  Patient Instructions: 1)  You have received an injection of cortisone today. You may experience increased pain at the injection site. Apply ice pack to the area for 20 minutes every 2 hours and take 2 xtra strength tylenol every 8 hours. This increased pain will usually resolve in 24 hours. The injection will take effect in 3-10 days.  2)  Please schedule a follow-up appointment in 6 weeks

## 2011-01-03 NOTE — Assessment & Plan Note (Signed)
Summary: [address] [place] LEFT SHOULDER AFTER INJECTION/EVERCARE/MEDICAID/BSF   Visit Type:  Follow-up  CC:  left shoulder pain.  History of Present Illness: I saw [facility] in the office today for a 6 week followup visit.  She is a 77 years old woman with the complaint of:  left shoulder  Follow up after injection.  she did get some mild relief from the injection but basically she is still having quite a bit of pain in the deltoid area  She is status post LEFT partial shoulder replacement in 2005 complains of pain in the LEFT shoulder with stiffness for 7 months.  She notices that her activities of daily living have become more difficult.  Her pain is unrelieved by Norco 5 mg     Allergies: No Known Drug Allergies   Impression & Recommendations:  Problem # 1:  SHOULDER JOINT REPLACEMENT BY OTHER MEANS (ICD-V43.61) Assessment Unchanged  I will like to try a stronger pain medication I don't think she is a candidate for revision surgery to total shoulder because of the condition of the rotator cuff  Recommend increased Norco 7.5 mg followup 3 months  Orders: Est. Patient Level II [id number])  Problem # 2:  SHOULDER PAIN (ICD-719.41) Assessment: Unchanged  Her updated medication list for this problem includes:    Norco 5-325 Mg Tabs (Hydrocodone-acetaminophen) .Marland Kitchen... 1 -2 q 4 as needed pain    Norco 7.5-325 Mg Tabs (Hydrocodone-acetaminophen) .Marland Kitchen... 1 by mouth q 4 as needed pain  Orders: Est. Patient Level II [id number])  Medications Added to Medication List This Visit: 1)  Norco 7.5-325 Mg Tabs (Hydrocodone-acetaminophen) .Marland Kitchen.. 1 by mouth q 4 as needed pain  Patient Instructions: 1)  Please schedule a follow-up appointment in 3 months. Prescriptions: NORCO 7.5-325 MG TABS (HYDROCODONE-ACETAMINOPHEN) 1 by mouth q 4 as needed pain  #84 x 5   Entered and Authorized by:   Arther Abbott MD   Signed by:   Arther Abbott MD on 05/31/2010   Method used:   Print then Give to  Patient   RxID:   [id number]

## 2011-01-03 NOTE — Assessment & Plan Note (Signed)
Summary: 3 M RE-CK LT SHOULDER/EVERCARE,MEDICAID/CAF   Visit Type:  Follow-up  CC:  left shoulder pain.  History of Present Illness: I saw [facility] in the office today for a 3 month followup visit.  She is a 77 years old woman with the complaint of:  left shoulder  She is status post LEFT partial shoulder replacement in 2005  Medications: Norco 7.5-325 Mg Tabs (Hydrocodone-acetaminophen) .Marland Kitchen.. 1 by mouth q 4 as needed pain  The pain medicine does not seem to have helped her but she is not interested in any more surgery   Review of systems doess not show any new complaint   I did review her last x-ray and it shows that the prosthesis is in good position with good articulation with the glenoid  Her physical exam shows passive range of motion 80 of abduction, 45 of external rotation, 80 of forward elevation and she has weakness in the rotator cuff  She would like to try an increase in the pain medication one to 2 tablets of hydrocodone 10 mg and follow up again in 3 months.       Allergies: No Known Drug Allergies   Impression & Recommendations:  Problem # 1:  SHOULDER JOINT REPLACEMENT BY OTHER MEANS (ICD-V43.61)  Orders: Est. Patient Level III [id number])  Problem # 2:  IMPINGEMENT SYNDROME (ICD-726.2)  Medications Added to Medication List This Visit: 1)  Norco 10-325 Mg Tabs (Hydrocodone-acetaminophen) .Marland Kitchen.. 1-2 q 4 as needed for pain   med dose increase  Patient Instructions: 1)  Please schedule a follow-up appointment in 3 months. Prescriptions: NORCO 10-325 MG TABS (HYDROCODONE-ACETAMINOPHEN) 1-2 q 4 as needed for pain   med dose increase  #90 x 5   Entered and Authorized by:   Arther Abbott MD   Signed by:   Arther Abbott MD on 09/01/2010   Method used:   Print then Give to Patient   RxID:   [id number]

## 2011-01-03 NOTE — Miscellaneous (Signed)
Summary: referral deveshwar  Clinical Lists Changes  Orders: Added new Referral order of Misc. Referral (Misc. Ref) - Signed

## 2011-01-03 NOTE — Letter (Signed)
Summary: History form  History form   Imported By: Ruffin Pyo 04/13/2010 15:28:56  _____________________________________________________________________  External Attachment:    Type:   Image     Comment:   External Document

## 2011-01-03 NOTE — Progress Notes (Signed)
Summary: Progress note  Office Visit   Imported By: Ruffin Pyo 12/24/2007 10:40:57  _____________________________________________________________________  External Attachment:    Type:   Image     Comment:   INITIAL EVALUATION

## 2011-01-03 NOTE — Progress Notes (Signed)
Summary: Office Visit  Office Visit   Imported By: Ruffin Pyo 12/24/2007 10:39:16  _____________________________________________________________________  External Attachment:    Type:   Image     Comment:   INITIAL EVALUATION

## 2011-01-03 NOTE — Progress Notes (Signed)
Summary: Appointment with Dr. Estanislado Pandy.  Phone Note Outgoing Call   Call placed by: Santo Held,  July 30, 2008 2:19 PM Call placed to: Patient Action Taken: Appt scheduled, Information Sent Reason for Call: Confirm/change Appt Summary of Call: I called to give the patient her appointment with Dr. Estanislado Pandy on 08-25-08 at 1:50. Patient said that she was going to call and try to reschedule.

## 2011-01-03 NOTE — Assessment & Plan Note (Signed)
Summary: NEW PROB/LT KNEE PAIN/NO FILM/MEDICARE,MEDICAID/CAF    History of Present Illness: I saw [name] in the office today for a followup visit.  She is a 77 years old woman with the complaint of:  NEW PROBLEM.  She has been having left knee pain on and off for 6 months. Pain inner to middle knee. Pain goes up leg above knee some but does not go down leg.  Sometimes she notices swelling and weakness of the knee.  She states that her knee catches sometimes.  No pain today.  There is not anything that brings the pain on specifically.  She has used ice in the past which helped.  Tylenol 2 every 4 hrs was taken for pain, did not help.  She has not had any xrays of her left knee.  medication list needs to be updated     Current Allergies: No known allergies  Updated/Current Medications (including changes made in today's visit):  * HYDROCODONE/APAP 7.5/500 one by mouth every 6-8 hrs as needed pain * SYNTHROID 75 MCG  * DILTIAZEM 30MG   * ACTOS PLUS MET 15/500MG     Past Medical History:    Reviewed history from 12/24/2007 and no changes required:       High Blood Pressure  Past Surgical History:    Reviewed history from 12/24/2007 and no changes required:       HYSTERECTOMY       RIGHT CARPAL TUNNEL RELEASE       BY GRAFTS RIGHT & LEFT LOWER EXTREMITY       TONSILLECTOMY       CHOLECYSTECTOMY       LEFT SHOULDER HEMIARTHROPLASTY     Review of Systems  MS      h/o shoulder pain s/p hemiarthroplasty on one side and glenohumeral arthritis on the other     Knee Exam  General:    Well-developed, well-nourished, normal body habitus; no deformities, normal grooming.  Gait:    Normal heel-toe gait pattern bilaterally.    Skin:    Intact, no scars, lesions, rashes, cafe au lait spots, or bruising.    Vascular:    There was no swelling or varicose veins. The pulses and temperature are normal. There was no edema or tenderness.  Sensory:    Gross  coordination and sensation were normal.    Motor:    Motor strength 5/5 bilaterally for quadriceps, hamstrings, ankle dorsiflexion, and ankle plantar flexion.    Reflexes:    Normal and symmetric patellar and Achilles reflexes bilaterally.    Knee Exam:    Left:    Inspection:  Abnormal    Palpation:  Abnormal    Stability:  stable    Tenderness:  diffuse    Swelling:  no    Range of Motion:       Flexion-Active: 120 degrees       Extension-Active: full    Impression & Recommendations:  Problem # 1:  KNEE PAIN [id number]) Assessment: New  Orders: Est. Patient Level III [id number])   Problem # 2:  DEGENERATIVE JOINT DISEASE, LEFT KNEE (ICD-715.96) Assessment: New  Orders: Est. Patient Level III [id number]) Knee x-ray,  3 views [id number]): degenerative arthritis   Medications Added to Medication List This Visit: 1)  Hydrocodone/apap 7.5/500  .... One by mouth every 6-8 hrs as needed pain 2)  Synthroid 75 Mcg  3)  Diltiazem 30mg   4)  Actos Plus Met 15/500mg     Patient Instructions: 1)  Patient will call us  back with her medications and then I'll recommend a medicine for the knee. 2)  Please schedule a follow-up appointment as needed.    ]

## 2011-01-03 NOTE — Op Note (Signed)
Summary: Operative Report  Operative Report   Imported By: Ruffin Pyo 12/24/2007 10:40:19  _____________________________________________________________________  External Attachment:    Type:   Image     Comment:   LEFT SHOULDER HEMIARTHROPLASTY

## 2011-01-03 NOTE — Miscellaneous (Signed)
Summary: relafen  Clinical Lists Changes  Medications: Added new medication of * RELAFEN 750MG  one tablet by mouth bid - Signed Rx of RELAFEN 750MG  one tablet by mouth bid;  #60 x 0;  Signed;  Entered by: Santo Held;  Authorized by: Arther Abbott MD;  Method used: Handwritten    Prescriptions: RELAFEN 750MG  one tablet by mouth bid  #60 x 0   Entered by:   Santo Held   Authorized by:   Arther Abbott MD   Signed by:   Santo Held on 12/26/2007   Method used:   Handwritten   [id number]

## 2011-01-03 NOTE — Progress Notes (Signed)
Summary: updated meds  Phone Note Other Incoming   Summary of Call: dr Aline Brochure I updated pts meds. Initial call taken by: Peter Minium,  December 25, 2007 1:47 PM  Follow-up for Phone Call        relafen 750 two times a day 60  Follow-up by: Arther Abbott MD,  December 26, 2007 8:29 AM

## 2011-01-05 NOTE — Assessment & Plan Note (Signed)
Summary: 3 M RE-CK LT SHOULDER/EVERCARE,MEDICAID/CAF   Visit Type:  Follow-up  CC:  left shoulder pain.  History of Present Illness: I saw [facility] in the office today for a 3 month followup visit.  She is a 77 years old woman with the complaint of:  left shoulder  She is status post LEFT partial shoulder replacement in 2005  Medications: Norco 10 Mg Tabs (Hydrocodone-acetaminophen) .Marland Kitchen.. 1-2 by mouth q 4 as needed pain  Complaints: She says her shoulder is the same, the pain medicine does help.  She is allowed one to 2 tablets q.4 p.r.n. for pain and that is working for her at this time. She was worried that she was susceptible to addiction. I explained to her that it is unlikely because she is using medicine as prescribed and she is using it for pain. That is uncontrollable by any other means and she can have surgery.  Shook him back in 2 months. She doesn't need any refills today   Allergies: No Known Drug Allergies   Other Orders: Est. Patient Level II [id number])  Patient Instructions: 1)  Take pain medication as needed 2)  Come back in 2 months for a recheck   Orders Added: 1)  Est. Patient Level II [id number]

## 2011-02-15 ENCOUNTER — Ambulatory Visit: Payer: Self-pay | Admitting: Orthopedic Surgery

## 2011-03-14 LAB — GLUCOSE, CAPILLARY
Glucose-Capillary: 75 mg/dL (ref 70–99)
Glucose-Capillary: 79 mg/dL (ref 70–99)
Glucose-Capillary: 87 mg/dL (ref 70–99)
Glucose-Capillary: 91 mg/dL (ref 70–99)

## 2011-03-16 LAB — DIFFERENTIAL
Basophils Absolute: 0.1 10*3/uL (ref 0.0–0.1)
Basophils Absolute: 0 10*3/uL (ref 0.0–0.1)
Basophils Relative: 1 % (ref 0–1)
Basophils Relative: 0 % (ref 0–1)
Eosinophils Absolute: 0.2 10*3/uL (ref 0.0–0.7)
Eosinophils Absolute: 0.1 10*3/uL (ref 0.0–0.7)
Eosinophils Relative: 3 % (ref 0–5)
Eosinophils Relative: 2 % (ref 0–5)
Lymphocytes Relative: 40 % (ref 12–46)
Lymphocytes Relative: 36 % (ref 12–46)
Lymphs Abs: 2.4 10*3/uL (ref 0.7–4.0)
Lymphs Abs: 2.1 10*3/uL (ref 0.7–4.0)
Monocytes Absolute: 0.9 10*3/uL (ref 0.1–1.0)
Monocytes Absolute: 0.6 10*3/uL (ref 0.1–1.0)
Monocytes Relative: 14 % — ABNORMAL HIGH (ref 3–12)
Monocytes Relative: 11 % (ref 3–12)
Neutro Abs: 2.5 10*3/uL (ref 1.7–7.7)
Neutro Abs: 3 10*3/uL (ref 1.7–7.7)
Neutrophils Relative %: 42 % — ABNORMAL LOW (ref 43–77)
Neutrophils Relative %: 51 % (ref 43–77)

## 2011-03-16 LAB — BASIC METABOLIC PANEL
BUN: 19 mg/dL (ref 6–23)
BUN: 16 mg/dL (ref 6–23)
CO2: 20 mEq/L (ref 19–32)
CO2: 23 mEq/L (ref 19–32)
Calcium: 9.1 mg/dL (ref 8.4–10.5)
Calcium: 9 mg/dL (ref 8.4–10.5)
Chloride: 105 mEq/L (ref 96–112)
Chloride: 106 mEq/L (ref 96–112)
Creatinine, Ser: 1.42 mg/dL — ABNORMAL HIGH (ref 0.4–1.2)
Creatinine, Ser: 1.37 mg/dL — ABNORMAL HIGH (ref 0.4–1.2)
GFR calc Af Amer: 43 mL/min — ABNORMAL LOW (ref >60)
GFR calc Af Amer: 45 mL/min — ABNORMAL LOW (ref >60)
GFR calc non Af Amer: 36 mL/min — ABNORMAL LOW (ref >60)
GFR calc non Af Amer: 37 mL/min — ABNORMAL LOW (ref >60)
Glucose, Bld: 120 mg/dL — ABNORMAL HIGH (ref 70–99)
Glucose, Bld: 96 mg/dL (ref 70–99)
Potassium: 4.8 mEq/L (ref 3.5–5.1)
Potassium: 4.6 mEq/L (ref 3.5–5.1)
Sodium: 135 mEq/L (ref 135–145)
Sodium: 138 mEq/L (ref 135–145)

## 2011-03-16 LAB — URINALYSIS, ROUTINE W REFLEX MICROSCOPIC
Bilirubin Urine: NEGATIVE
Glucose, UA: NEGATIVE mg/dL
Hgb urine dipstick: NEGATIVE
Ketones, ur: NEGATIVE mg/dL
Nitrite: NEGATIVE
Protein, ur: NEGATIVE mg/dL
Specific Gravity, Urine: 1.02 (ref 1.005–1.030)
Urobilinogen, UA: 0.2 mg/dL (ref 0.0–1.0)
pH: 5.5 (ref 5.0–8.0)

## 2011-03-16 LAB — CBC
HCT: 32.1 % — ABNORMAL LOW (ref 36.0–46.0)
HCT: 28.7 % — ABNORMAL LOW (ref 36.0–46.0)
Hemoglobin: 10.5 g/dL — ABNORMAL LOW (ref 12.0–15.0)
Hemoglobin: 9.4 g/dL — ABNORMAL LOW (ref 12.0–15.0)
MCHC: 32.7 g/dL (ref 30.0–36.0)
MCHC: 32.9 g/dL (ref 30.0–36.0)
MCV: 85.7 fL (ref 78.0–100.0)
MCV: 85.6 fL (ref 78.0–100.0)
Platelets: 212 10*3/uL (ref 150–400)
Platelets: 276 10*3/uL (ref 150–400)
RBC: 3.75 MIL/uL — ABNORMAL LOW (ref 3.87–5.11)
RBC: 3.35 MIL/uL — ABNORMAL LOW (ref 3.87–5.11)
RDW: 15.8 % — ABNORMAL HIGH (ref 11.5–15.5)
RDW: 15.4 % (ref 11.5–15.5)
WBC: 6.1 10*3/uL (ref 4.0–10.5)
WBC: 5.9 10*3/uL (ref 4.0–10.5)

## 2011-03-16 LAB — GLUCOSE, CAPILLARY: Glucose-Capillary: 110 mg/dL — ABNORMAL HIGH (ref 70–99)

## 2011-03-21 LAB — COMPREHENSIVE METABOLIC PANEL
ALT: 16 U/L (ref 0–35)
AST: 20 U/L (ref 0–37)
Albumin: 3.6 g/dL (ref 3.5–5.2)
Alkaline Phosphatase: 81 U/L (ref 39–117)
BUN: 22 mg/dL (ref 6–23)
CO2: 23 mEq/L (ref 19–32)
Calcium: 9.4 mg/dL (ref 8.4–10.5)
Chloride: 107 mEq/L (ref 96–112)
Creatinine, Ser: 1.49 mg/dL — ABNORMAL HIGH (ref 0.4–1.2)
GFR calc Af Amer: 41 mL/min — ABNORMAL LOW (ref >60)
GFR calc non Af Amer: 34 mL/min — ABNORMAL LOW (ref >60)
Glucose, Bld: 134 mg/dL — ABNORMAL HIGH (ref 70–99)
Potassium: 4.6 mEq/L (ref 3.5–5.1)
Sodium: 140 mEq/L (ref 135–145)
Total Bilirubin: 1 mg/dL (ref 0.3–1.2)
Total Protein: 6.8 g/dL (ref 6.0–8.3)

## 2011-03-21 LAB — GLUCOSE, CAPILLARY
Glucose-Capillary: 106 mg/dL — ABNORMAL HIGH (ref 70–99)
Glucose-Capillary: 113 mg/dL — ABNORMAL HIGH (ref 70–99)
Glucose-Capillary: 115 mg/dL — ABNORMAL HIGH (ref 70–99)
Glucose-Capillary: 124 mg/dL — ABNORMAL HIGH (ref 70–99)
Glucose-Capillary: 127 mg/dL — ABNORMAL HIGH (ref 70–99)
Glucose-Capillary: 127 mg/dL — ABNORMAL HIGH (ref 70–99)
Glucose-Capillary: 129 mg/dL — ABNORMAL HIGH (ref 70–99)
Glucose-Capillary: 132 mg/dL — ABNORMAL HIGH (ref 70–99)
Glucose-Capillary: 136 mg/dL — ABNORMAL HIGH (ref 70–99)
Glucose-Capillary: 142 mg/dL — ABNORMAL HIGH (ref 70–99)
Glucose-Capillary: 143 mg/dL — ABNORMAL HIGH (ref 70–99)
Glucose-Capillary: 146 mg/dL — ABNORMAL HIGH (ref 70–99)
Glucose-Capillary: 161 mg/dL — ABNORMAL HIGH (ref 70–99)
Glucose-Capillary: 177 mg/dL — ABNORMAL HIGH (ref 70–99)
Glucose-Capillary: 92 mg/dL (ref 70–99)
Glucose-Capillary: 96 mg/dL (ref 70–99)

## 2011-03-21 LAB — DIFFERENTIAL
Basophils Absolute: 0 10*3/uL (ref 0.0–0.1)
Basophils Relative: 0 % (ref 0–1)
Eosinophils Absolute: 0.1 10*3/uL (ref 0.0–0.7)
Eosinophils Relative: 2 % (ref 0–5)
Lymphocytes Relative: 20 % (ref 12–46)
Lymphs Abs: 1.6 10*3/uL (ref 0.7–4.0)
Monocytes Absolute: 0.7 10*3/uL (ref 0.1–1.0)
Monocytes Relative: 9 % (ref 3–12)
Neutro Abs: 5.4 10*3/uL (ref 1.7–7.7)
Neutrophils Relative %: 68 % (ref 43–77)

## 2011-03-21 LAB — CBC
HCT: 28.3 % — ABNORMAL LOW (ref 36.0–46.0)
HCT: 38.5 % (ref 36.0–46.0)
Hemoglobin: 12.5 g/dL (ref 12.0–15.0)
Hemoglobin: 9.7 g/dL — ABNORMAL LOW (ref 12.0–15.0)
MCHC: 32.6 g/dL (ref 30.0–36.0)
MCHC: 34.4 g/dL (ref 30.0–36.0)
MCV: 85.6 fL (ref 78.0–100.0)
MCV: 88 fL (ref 78.0–100.0)
Platelets: 157 10*3/uL (ref 150–400)
Platelets: 95 10*3/uL — ABNORMAL LOW (ref 150–400)
RBC: 3.22 MIL/uL — ABNORMAL LOW (ref 3.87–5.11)
RBC: 4.5 MIL/uL (ref 3.87–5.11)
RDW: 15.4 % (ref 11.5–15.5)
RDW: 15.5 % (ref 11.5–15.5)
WBC: 7.7 10*3/uL (ref 4.0–10.5)
WBC: 7.9 10*3/uL (ref 4.0–10.5)

## 2011-03-21 LAB — POCT I-STAT EG7
Acid-base deficit: 5 mmol/L — ABNORMAL HIGH (ref 0.0–2.0)
Bicarbonate: 20.3 mEq/L (ref 20.0–24.0)
Calcium, Ion: 1.18 mmol/L (ref 1.12–1.32)
HCT: 27 % — ABNORMAL LOW (ref 36.0–46.0)
Hemoglobin: 9.2 g/dL — ABNORMAL LOW (ref 12.0–15.0)
O2 Saturation: 93 %
Patient temperature: 36
Potassium: 4.9 mEq/L (ref 3.5–5.1)
Sodium: 143 mEq/L (ref 135–145)
TCO2: 21 mmol/L (ref 0–100)
pCO2, Ven: 36.2 mmHg — ABNORMAL LOW (ref 45.0–50.0)
pH, Ven: 7.353 — ABNORMAL HIGH (ref 7.250–7.300)
pO2, Ven: 66 mmHg — ABNORMAL HIGH (ref 30.0–45.0)

## 2011-03-21 LAB — TYPE AND SCREEN
ABO/RH(D): B POS
Antibody Screen: NEGATIVE

## 2011-03-21 LAB — POCT I-STAT 4, (NA,K, GLUC, HGB,HCT)
Glucose, Bld: 118 mg/dL — ABNORMAL HIGH (ref 70–99)
Glucose, Bld: 136 mg/dL — ABNORMAL HIGH (ref 70–99)
HCT: 25 % — ABNORMAL LOW (ref 36.0–46.0)
HCT: 29 % — ABNORMAL LOW (ref 36.0–46.0)
Hemoglobin: 8.5 g/dL — ABNORMAL LOW (ref 12.0–15.0)
Hemoglobin: 9.9 g/dL — ABNORMAL LOW (ref 12.0–15.0)
Potassium: 4.4 mEq/L (ref 3.5–5.1)
Potassium: 4.7 mEq/L (ref 3.5–5.1)
Sodium: 142 mEq/L (ref 135–145)
Sodium: 142 mEq/L (ref 135–145)

## 2011-03-21 LAB — BASIC METABOLIC PANEL
BUN: 12 mg/dL (ref 6–23)
CO2: 23 mEq/L (ref 19–32)
Calcium: 7.2 mg/dL — ABNORMAL LOW (ref 8.4–10.5)
Chloride: 112 mEq/L (ref 96–112)
Creatinine, Ser: 1.27 mg/dL — ABNORMAL HIGH (ref 0.4–1.2)
GFR calc Af Amer: 49 mL/min — ABNORMAL LOW (ref >60)
GFR calc non Af Amer: 40 mL/min — ABNORMAL LOW (ref >60)
Glucose, Bld: 89 mg/dL (ref 70–99)
Potassium: 4.3 mEq/L (ref 3.5–5.1)
Sodium: 138 mEq/L (ref 135–145)

## 2011-03-21 LAB — PROTIME-INR
INR: 1 (ref 0.00–1.49)
Prothrombin Time: 13.4 seconds (ref 11.6–15.2)

## 2011-03-21 LAB — HEMOGLOBIN A1C
Hgb A1c MFr Bld: 6.3 % — ABNORMAL HIGH (ref 4.6–6.1)
Mean Plasma Glucose: 134 mg/dL

## 2011-03-21 LAB — APTT: aPTT: 29 seconds (ref 24–37)

## 2011-03-21 LAB — ABO/RH: ABO/RH(D): B POS

## 2011-03-23 ENCOUNTER — Encounter: Payer: Self-pay | Admitting: Orthopedic Surgery

## 2011-03-23 ENCOUNTER — Ambulatory Visit (INDEPENDENT_AMBULATORY_CARE_PROVIDER_SITE_OTHER): Payer: PRIVATE HEALTH INSURANCE | Admitting: Orthopedic Surgery

## 2011-03-23 DIAGNOSIS — M719 Bursopathy, unspecified: Secondary | ICD-10-CM

## 2011-03-23 DIAGNOSIS — M25319 Other instability, unspecified shoulder: Secondary | ICD-10-CM

## 2011-03-23 DIAGNOSIS — M67919 Unspecified disorder of synovium and tendon, unspecified shoulder: Secondary | ICD-10-CM

## 2011-03-23 DIAGNOSIS — M25519 Pain in unspecified shoulder: Secondary | ICD-10-CM

## 2011-03-23 HISTORY — DX: Other instability, unspecified shoulder: M25.319

## 2011-03-23 MED ORDER — HYDROCODONE-ACETAMINOPHEN 10-325 MG PO TABS: 1.0000 | ORAL_TABLET | ORAL | Status: DC | PRN

## 2011-03-23 NOTE — Progress Notes (Signed)
A 77 years old status post LEFT hemiarthroplasty LEFT shoulder has RIGHT rotator cuff disease as well with arthropathy.  She ran out of her medication hydrocodone 10 mg.  She says that both shoulders hurt and she is also having some neck pain.  She has limited range of motion of both shoulders, and painful range of motion in both shoulders.  The last x-ray. We took of the LEFT shoulder shows that she has a well fitting. LEFT hemiarthroplasty. She may be having some glenoid pain.  However, I would prefer RIGHT shoulder replacement versus revision LEFT if she needs anything done,.  She'll call us if she needs more medication or she changes her mind about surgery

## 2011-03-23 NOTE — Patient Instructions (Signed)
Call for refills. 

## 2011-04-18 NOTE — Consult Note (Signed)
[name], [name]             ACCOUNT NO.:  [id number]   MEDICAL RECORD NO.:  [id number]          PATIENT TYPE:  REC   LOCATION:  FOOT                         FACILITY:  [facility]   PHYSICIAN:  Orlando Penner. Sevier, M.D. DATE OF BIRTH:  03-08-1926   DATE OF CONSULTATION:  11/06/2007  DATE OF DISCHARGE:                                 CONSULTATION   HISTORY:  This is a 77 year old black female is referred at the courtesy  of Dr. [name] for assistance with problems of surrounding the stoma of the  right upper quadrant colostomy.   The patient has several health problems which are relatively stable but  in 2006 because of recurrent apparently rather severe diverticulitis,  she required a diverting colostomy which was performed and the right  upper quadrant.  The colostomy has functioned well, but she very soon  got into trouble with a problems in the peri stomal area.  She tried all  her usual powers and has been seen on numerous visits at home by an  ostomy nurse, and nobody has seemed to be able to give her any relief of  this constant irritation and deluding of the skin around the ostomy.  She is unaware that any cultures have been made.  Her most recent  treatment has been simply the use of Neosporin in that area.  Consideration has been given to takedown of colostomy with the feeling  that she has a reasonably normal colonoscopic exam now and that her  colon could function in the absence of a colostomy, but because of her  other medical problems and her age, she has resisted doing this.   In association with the present illness, she has noted some pains around  the lower rib margin on the right and in the deep and right upper  quadrant which had been called costochondritis and treated with anti-  inflammatories without benefit.  Certainly, the radiation of this pain  is such that it might well be coming from the ostomy problem itself.  It  is noted that she has had a past  cholecystectomy.   There has been no bleeding from the ostomy.  No obvious purulence around  it, and otherwise it has functioned well.   She is here today for our advice regarding the problems with the  peristomal skin.   PAST MEDICAL HISTORY:  1. Surgeries include the colostomy as mentioned and her      cholecystectomy a number of years previous.  2. She has had cataract surgeries on both eyes.  3. Other hospitalizations have been on occasion for diverticulitis and      hypertensive problems.   REGULAR MEDICATIONS:  1. Actos/Metformin combination 15/500 one daily.  2. Diltiazem 30 mg daily.  3. Mirtazapine 30 mg at bedtime.  4. Synthroid 75 mcg daily.   FAMILY HISTORY:  Notable for some hypertension and diabetes.  No history  of malignancy.   REVIEW OF SYSTEMS:  Except for those things previously mentioned, it is  noted the patient has diabetes which apparently has been reasonably well  managed and without serious complication.  She  also is hypertensive, and  this apparently has been reasonably adequately controlled with her  current medications.  She has no definite heart or lung disease,  although, it is noted that she says she had a great deal of difficulty  getting through her surgery at the time of the colostomy itself.  The  reasons for this are less than clear to me.   PHYSICAL EXAMINATION:  GENERAL APPEARANCE:  Examination today shows  alert, cooperative, elderly black female in no immediate distress.  VITAL SIGNS:  Her blood pressure is 171/66 her pulses 50.  Her  respirations are 16.  Her temperature is 98.3, random blood glucose is  153 mg/dL.  She shows no gross problems of the skin or extremities.  She  is in no coronary or respiratory difficulty.  NECK:  Her neck veins are flat with the patient at 45 degrees.  ABDOMEN:  Slightly obese but nontender without apparent masses or  organomegaly.  The ostomy is noted the right upper quadrant, and will be  described  in more detail later.  EXTREMITIES:  Free of edema.  Distal pulses are palpable.   The colostomy in the right upper quadrant of the abdomen is  approximately 2.5 cm in length and 1.5 cm in width and appears to be  very flexible and functioning well.  In the adjacent areas around it,  expanding to approximately 2.8 x 5.0 cm, there is denuding of the skin  and a very fine purulent appearing slough on the surface.  Just to the  medial end of the colostomy is noted a secondary opening which measures  several millimeters in either dimension and adequately admits a Q-tip.  He cannot be established with certainty that with this being explored to  a depth of 2.1 cm.  It cannot be established with certainty that it  communicates either with the colon or the colostomy itself.  Prior to my  arrival in the examining room, the nurse had removed from this small  wound a material that appears to be a fecalith or some very difficult  hard material measuring approximately 7 x 3 x 3 mm.  The periostomy area  of irritation is as previously described.   IMPRESSION:  Periwound inflammation, likely secondary to some low grade  infection with denuding of the skin.  It would appear now, based on  evaluation of her current ostomy wafer cutouts, but these are larger  than need to be, and this may have been a contributory factor.   DISPOSITION:  1. The wound is cultured both for bacteria and fungus with suspicion      that some of the latter may well be present.  2. The denuded areas adjacent to the ostomy are treated with an      application of XenoDerm, and the patient was given a supply of this      with the instructions to change the ostomy bag every 2 days and to      reapply this to the periostomy inflamed areas.   She will be seen again in one week at which time her culture results  will be available.  We can see if there has been any progress with this  management, and she is also at that time  instructed to bring with her an  uncut wafer so that we can hopefully more adequately shape it to the  ostomy itself.   She and her daughter understand and these problems are not our forte in  this particular clinic, but they are here in desperation having received  no help after several visits from an ostomy nurse.   The patient and her daughter instructed to call if there are any  problems tolerating the treatment provided prior to her next scheduled  visit.           ______________________________  Orlando Penner. London Pepper, M.D.     RES/MEDQ  D:  11/06/2007  T:  11/07/2007  Job:  [id number]   cc:   Barrie Folk. [name], MD

## 2011-04-18 NOTE — Op Note (Signed)
[name], [name]             ACCOUNT NO.:  [id number]   MEDICAL RECORD NO.:  [id number]          PATIENT TYPE:  OBV   LOCATION:  2550                         FACILITY:  [facility]   PHYSICIAN:  Theotis Burrow IV, MDDATE OF BIRTH:  [birth year]   DATE OF PROCEDURE:  01/07/2009  DATE OF DISCHARGE:                               OPERATIVE REPORT   PREOPERATIVE DIAGNOSIS:  Ischemic left leg.   POSTOPERATIVE DIAGNOSES:  Ischemic left leg.   PROCEDURE PERFORMED:  1. Ultrasound-guided access to right common femoral artery.  2. Abdominal aortogram.  3. Bilateral lower extremity runoff.   INDICATIONS:  This is an 77 year old female with history of bilateral  femoropopliteal bypass graft.  She presented with several-day history of  pain and numbness to her left leg.  She comes in for arteriogram.   PROCEDURE:  The patient was identified in the holding area, taken to  room 8, and she was placed supine on the table.  Bilateral groins were  prepped and draped in a standard sterile fashion.  A time-out was  called.  The right femoral artery was evaluated with ultrasound and  found to be patent.  Lidocaine 1% was used for local anesthesia.  I  elected to access the fem-pop bypass graft on the right due to its  anterior location.  It was accessed under ultrasound guidance.  There  was significant amount of scar tissue, I had trouble getting the sheath  down; however, I placed a 4-French end-hole catheter over the Bentson  wire which was removed and exchanged out for an Amplatz Super Stiff  wire.  The dilator with 6-French sheath was placed, then the 5-French  sheath was able to be advanced.  Next, an Omni flush catheter was placed  at the level of L1.  Abdominal aortogram was obtained.  The catheter was  pulled down in the aortic bifurcation and bilateral lower extremity  runoff was obtained.   FINDINGS:  Aortogram:  Visualized portions of suprarenal abdominal aorta  showed minimal disease.   There are single renal arteries bilaterally.  There is a high-grade right renal artery stenosis.  There is moderate  left renal artery stenosis.  The infrarenal abdominal aorta is mildly  diseased, but patent throughout its course.  The right common external  and internal iliac arteries were all mildly diseased, but patent.  The  left common iliac artery is patent.  The left hypogastric artery is  patent.  The left external iliac artery is occluded.  There is  reconstitution of the left femoral artery from hypogastric collaterals.   Right lower extremity:  The right common femoral artery is mildly  diseased.  The right profunda femoral artery is patent.  The right  superficial femoral artery is occluded.  There is a femoral above-knee  popliteal artery bypass graft which is patent.  The popliteal artery is  patent.  There is single-vessel runoff via the anterior tibial artery.   Left lower extremity:  The left common femoral artery is diseased, but  patent.  The left profunda femoral artery is patent.  Left superficial  femoral artery is  patent proximally, however, occludes in the adductor  canal.  There is limited visualization below the adductor canal due to  proximal disease.   With these findings, a decision was then made to take the patient to the  operating room for operative repair.  She will be taken to holding area  for sheath pull and then to the operating room.   IMPRESSION:  1. High-grade right renal artery stenosis.  2. Occluded left external iliac artery with reconstitution of the left      common femoral artery.  3. Occluded left femoral popliteal bypass graft.  4. Patent right femoral to above-knee popliteal bypass graft.           ______________________________  V. Leia Alf, MD  Electronically Signed     VWB/MEDQ  D:  01/07/2009  T:  01/07/2009  Job:  [id number]

## 2011-04-18 NOTE — Assessment & Plan Note (Signed)
OFFICE VISIT   [name], [name]  DOB:  08-Nov-1926                                       08/11/2009  [id number]   The patient is an 77 year old female who previously underwent a left fem-  pop bypass by Dr. Nils Pyle several years ago.  She underwent thrombectomy  of her left external iliac artery with left femoral to below knee  popliteal bypass with 2 segments of vein in February of 2010.  Subsequently she had a vein graft stenosis angioplastied by Dr. Trula Slade  in May of 2010.  She presents to the office today for further followup  after increased graft velocities were noted on a screening duplex exam.  The patient denies any claudication symptoms.  However, she does  complain of intermittent pain over the medial malleolus of her left leg.  She states the pain is aching in nature but is intermittent and not  related to walking, sitting or any particular activity.   PHYSICAL EXAM:  Blood pressure is 172/52 in the right arm, pulse is 45  and regular.  Lower extremities:  She has 2+ femoral pulses bilaterally.  She has absent popliteal and pedal pulses on the right foot.  She has a  2+ left popliteal pulse.  She has absent pedal pulses in the left foot.  Both feet are pink, warm and well-perfused.   I reviewed her graft duplex exam from August 13 which showed slightly  increased velocities in the distal left external iliac artery of 350  cm/sec.  She also had a mid graft increased velocity of 232 cm/sec.  ABI  on the left side was 0.71 and on the right 0.79.   To evaluate her in addition to the duplex scan an MRA exam was obtained  due to her elevated creatinine.  This showed a possible mid vein graft  stenosis in the left leg.  The distal left external iliac artery was of  smaller caliber than the right.  She did have a patent left fem-pop  bypass to the below knee popliteal artery with two vessel runoff via the  peroneal and anterior tibial artery  although the anterior tibial artery  on the left side is very diseased.   In the right lower extremity she has a patent right fem-pop bypass and  the runoff is primarily via the anterior tibial artery on the right with  occlusion of the posterior tibial artery.   In summary, the patient currently is not experiencing claudication  symptoms but does have elevated velocities in either the distal external  iliac artery on the left or proximal anastomosis as well as the mid  graft.  However, these velocities are not severe enough I think at this  point to consider intervention, especially based on the appearance of  the MRA exam.  I believe the best option for her is continued risk  factor modification and surveillance.  We will do a repeat graft duplex  exam in 3 months' time.  If the mid graft stenosis has continued to  progress we may consider an arteriogram or operative intervention if the  velocities suggest that.   [name], MD  Electronically Signed   CEF/MEDQ  D:  08/11/2009  T:  08/12/2009  Job:  [id number]

## 2011-04-18 NOTE — Procedures (Signed)
DUPLEX DEEP VENOUS EXAM - LOWER EXTREMITY   INDICATION:  Left leg pain and swelling.   HISTORY:  Edema:  Left leg.  Trauma/Surgery:  Thrombectomy of left external iliac artery and re-do  left fem-pop bypass graft on 01/07/09.  Pain:  Left leg pain.  PE:  No.  Previous DVT:  No.  Anticoagulants:  No.  Other:   DUPLEX EXAM:                CFV   SFV   PopV  PTV    GSV                R  L  R  L  R  L  R   L  R  L  Thrombosis    o  o     o     o      o  Spontaneous   +  +     +     +      +  Phasic        +  +     +     +      +  Augmentation  +  +     +     +      +  Compressible  +  +     +     +      +  Competent     +  +     +     +      +   Legend:  + - yes  o - no  p - partial  D - decreased   IMPRESSION:  1. No evidence of left leg extremity deep vein thrombosis.  2. Left greater saphenous vein could not be visualized.  3. The left common femoral vein appears to be compressed      extrinsically, possibly from peri-graft fluid around the proximal      anastomosis of the bypass graft.    _____________________________  [name], MD   MC/MEDQ  D:  01/20/2009  T:  01/20/2009  Job:  [id number]

## 2011-04-18 NOTE — Assessment & Plan Note (Signed)
[facility]   NAME:  [name], [name]             ACCOUNT NO.:  [id number]   MEDICAL RECORD NO.:  [id number]      DATE OF BIRTH:  [birth year]   PHYSICIAN:  Joneen Boers A. Nils Pyle, M.D. VISIT DATE:  11/12/2007                                   OFFICE VISIT   SUBJECTIVE:  [name] is a 77 year old female who was evaluated a week  ago for problems surrounding a colostomy stoma.  In the interim, the  patient is complaining of some crampy pain at the time of her colostomy  flow.  There has been no fever.  She describes the pain as cramping and  extending in an arc form over the right upper quadrant and left upper  quadrants.  There has been no nausea or fever.  She continues to be  ambulatory.   OBJECTIVE:  Blood pressure is 138/66, respirations are 18, pulse rate  66, temperature is 98.2.  ABDOMEN:  Shows that there is no evidence of peritonitis.  The colostomy  appears to be a double barrel loop colostomy in the right upper  quadrant.  The peristomal skin has some mild hyperemia consistent with  the caustic response to enteric___ components.  There is no fluctuance,  there is no evidence of abscess, there is no musculoskeletal tenderness  at the costal margins.   ASSESSMENT:  Essentially normal peristomal reaction.   PLAN:  We have discussed the management of the stomal care with Dr. [name]  and we have strongly recommended that this patient be referred to the  ostomy nurse at [facility] for instruction and recommendation.  She does  not have an active wound at this point and we are therefore discharging  her from active management in the wound center.  We have reassured the  patient that we will be happy to reevaluate her if the efforts of the  ostomy nurse fail to provide her with an enduring comfort level for her  ostomy management.  The patient was given an opportunity to ask  questions, she seems to understand and expresses gratitude for having  been seen in the  clinic.  She is discharged.      Harold A. Nils Pyle, M.D.  Electronically Signed     HAN/MEDQ  D:  11/12/2007  T:  11/12/2007  Job:  [id number]   cc:   Barrie Folk. [name], MD

## 2011-04-18 NOTE — Procedures (Signed)
BYPASS GRAFT EVALUATION   INDICATION:  Follow up left lower extremity bypass graft.   HISTORY:  Diabetes:  Yes.  Cardiac:  No.  Hypertension:  Yes.  Smoking:  No.  Previous Surgery:  Left fem-pop bypass graft on 01/07/2009 with PTA of  the bypass graft on 04/20/2009.  Other:  Complaint of mild left knee swelling and posterior knee pain for  3 days.   SINGLE LEVEL ARTERIAL EXAM                               RIGHT              LEFT  Brachial:                    153                146  Anterior tibial:             155                153  Posterior tibial:            148                142  Peroneal:  Ankle/brachial index:        1.01               1.0   PREVIOUS ABI:  Date: 02/09/2010  RIGHT:  1.05  LEFT:  1.01   LOWER EXTREMITY BYPASS GRAFT DUPLEX EXAM:   DUPLEX:  Biphasic Doppler waveforms noted throughout the left lower  extremity bypass graft and its native vessels with no evidence of  stenosis.   IMPRESSION:  1. Patent left femoropopliteal bypass graft with no evidence of      stenosis.  2. Stable bilateral ankle brachial indices.   ___________________________________________  Nelda Severe Kellie Simmering, M.D.   CH/MEDQ  D:  06/03/2010  T:  06/03/2010  Job:  [id number]

## 2011-04-18 NOTE — H&P (Signed)
[name], [name]             ACCOUNT NO.:  [id number]   MEDICAL RECORD NO.:  [id number]          PATIENT TYPE:  OBV   LOCATION:  2550                         FACILITY:  [facility]   PHYSICIAN:  Nelda Severe. Kellie Simmering, M.D.  DATE OF BIRTH:  Apr 04, 1926   DATE OF ADMISSION:  01/07/2009  DATE OF DISCHARGE:                              HISTORY & PHYSICAL   CHIEF COMPLAINT:  Ischemic left leg.   HISTORY OF PRESENT ILLNESS:  This is an 77 year old African American  female who has previously had bilateral femoral popliteal bypass graft  by Dr. Zada Girt several years ago.  We do not have those records.  She has had coolness and slight numbness in the left foot for several  days and at 2:30 a.m. this morning developed pain in the left calf and  foot, went to the [facility] where she was found to  have no pulses in the left leg.  She was referred to [facility] for  further treatment.   PAST MEDICAL HISTORY:  1. Possible diabetes.  2. Hypertension.  3. Negative coronary artery disease or stroke.  4. Hypothyroidism.   PAST SURGERIES:  1. Hysterectomy.  2. Colon resection for diverticulosis with permanent right lower      quadrant colostomy.  3. Left shoulder surgery.   ALLERGIES:  None known.   MEDICATIONS:  1. Diltiazem 30 mg q.6 h.  2. Hydrocodone 7.5/500 p.r.n.  3. Synthroid oral 75 mcg daily.  4. Lodine 400 mg b.i.d.  5. Simvastatin 20 mg nightly.   PHYSICAL EXAMINATION:  VITAL SIGNS:  Blood pressure 170/60, heart rate  80, respirations 98.  GENERAL:  She is alert and oriented x3.  NECK:  Supple, 3+ carotid pulses palpable.  CHEST:  Clear to auscultation.  CARDIOVASCULAR:  Regular rhythm.  No murmurs.  ABDOMEN:  Soft, nontender.  There is a colostomy in the right lower  quadrant.  EXTREMITIES:  Right leg has 3+ femoral popliteal and 2+ dorsalis pedis  pulse.  Left leg has absent femoral and distal pulses.  There is  decreased sensation in the left  foot.  There is some motion in the left  foot, but slightly diminished.  No calf tenderness.   IMPRESSION:  Ischemic left leg secondary to left iliac occlusion and  probable occluded left femoral-popliteal bypass graft.   PLAN:  Obtain an angiogram, take to the OR if endovascular intervention  is not feasible.  She may need a right-to-left femoral-femoral bypass  for limb salvage.  Risks and benefits have been thoroughly discussed  with the patient and her niece and would like to proceed.      Nelda Severe Kellie Simmering, M.D.  Electronically Signed     JDL/MEDQ  D:  01/07/2009  T:  01/07/2009  Job:  [id number]

## 2011-04-18 NOTE — Op Note (Signed)
[name], [name]             ACCOUNT NO.:  [id number]   MEDICAL RECORD NO.:  [id number]          PATIENT TYPE:  OBV   LOCATION:  63                         FACILITY:  [facility]   PHYSICIAN:  [name], MD  DATE OF BIRTH:  Mar 25, 1926   DATE OF PROCEDURE:  01/07/2009  DATE OF DISCHARGE:                               OPERATIVE REPORT   PROCEDURES:  1. Thrombectomy of left external iliac artery.  2. Left external iliac artery endarterectomy.  3. Left profundoplasty.  4. Left common femoral to below-knee popliteal bypass with reversed      spliced greater saphenous vein from ipsilateral leg.  5. Intraoperative arteriogram x2.   PREOPERATIVE DIAGNOSIS:  Ischemia, left lower extremity.   POSTOPERATIVE DIAGNOSIS:  Ischemia, left lower extremity.   ANESTHESIA:  General.   ASSISTANTS:  1. Rosetta Posner, MD  2. Jacinta Shoe, PA-C   INDICATIONS:  The patient is an 77 year old female who has previously  undergone a left femoral to above-knee popliteal bypass with Gore-Tex  several years ago.  She presented to the [facility] Emergency Room early  this morning with acute ischemia in her left lower extremity.  Preoperative arteriogram showed occlusion of her left external iliac  artery and occlusion of the femoral-popliteal bypass graft.  There was  some reconstitution of the below-knee popliteal artery.  Preoperatively,  the procedure, benefits, risks, details, and possible complications were  discussed with the patient and her daughter.  These were included but  not limited to limb loss, bleeding, graft thrombosis, infection, and  myocardial infarction.   OPERATIVE FINDINGS:  1. Large atherosclerotic plaque, left external iliac artery.  2. Left greater saphenous vein 3 to 3.5 mm in diameter.  3. Dacron patch on the proximal left profunda femoris artery.  4. Runoff via the peroneal artery, which is not in complete      continuity.   OPERATIVE DETAILS:  After  obtaining informed consent, the patient was  taken to the operating room.  The patient was placed in supine position  on the operating table.  After induction of general anesthesia and  endotracheal intubation, the patient was prepped from the umbilicus down  to the toes bilaterally.  Next, a longitudinal incision was made in the  left groin, carried down through subcutaneous tissues down to the level  of preexisting Gore-Tex bypass graft.  This was dissected down to the  level of the common femoral artery.  Common femoral artery was dissected  free circumferentially.  The superficial femoral artery and profunda  femoris arteries were dissected free circumferentially and vessel loops  placed around these.  The common femoral artery on palpation was quite  soft in character.  At this point, the patient was given 7000 units of  intravenous heparin.  The proximal anastomosis of the fem-pop bypass  graft was taken down.  There was a small amount of backbleeding from the  proximal SFA, but preoperative imaging had showed that the SFA was  occluded.  This was controlled initially with a vessel loop.  Profunda  femoris artery had good backbleeding.  This was also  controlled with a  vessel loop.  There was a trickle of blood coming down the native  external iliac system.  A #4 and #5 Fogarty catheter was passed up the  left external iliac system multiple passes.  First, a large amount of  thrombus was removed.  The flow improved somewhat, but was still not  pulsatile in quality.  The #5 Fogarty was then passed again several  times and at this point, a large plug of atherosclerotic plaque was  removed.  This was approximately 8 cm in length.  Upon removing this  plaque, there was excellent pulsatile inflow.  The catheter was passed  one more time to make sure that a clean pass was obtained.  This was  then clamped proximally with a Cooley clamp.  At this point, it was  decided to try to do a  profundoplasty as her definitive procedure.  The  preexisting fem-pop bypass had accumulated some minimal hyperplasia and  atherosclerotic plaque near its origin.  This was endarterectomized.  The superficial femoral artery was inspected and this would not even  accept a #4 Fogarty catheter at its origin.  Therefore, this was  ligated.  The endarterectomy was carried down into the proximal 2 cm of  the profunda femoris artery.  A good feathered endpoint was obtained at  this point.  Next, a Dacron patch was brought up in the operative field  and this was sewn as patch angioplasty using a running 6-0 Prolene  suture.  Just prior to completion of anastomosis, this was fore bled,  back bled, and thoroughly flushed.  Anastomosis was secured.  Clamps  were released.  There was pulsatile flow in the profunda immediately.  There was good Doppler flow in the profunda as well.  However, there  were still no Doppler signals in the foot.  It was decided at this point  to cut down on the below-knee popliteal artery, which had been  visualized in the late phase on the preoperative arteriogram.  Incision  was made on the medial aspect of the left leg and carried down through  subcutaneous tissues.  The saphenous vein was dissected free  circumferentially and reflected laterally.  This was approximately 3 to  3.5 mm in diameter.  Small side branches were ligated and divided  between silk ties and clips.  Incision was deepened down into the  popliteal space.  The popliteal artery was approximately 3.5 to 4 mm in  diameter.  This was fairly soft in character.  It was dissected free  circumferentially.  At this point, a longitudinal opening was made and  what was thought to be a soft area of the popliteal artery; however, on  opening the artery, there was a moderate amount of atherosclerotic  plaque within the artery itself.  A bubble-tipped needle was then  introduced into the distal popliteal artery.  An  intraoperative  arteriogram was then obtained.  This showed that the below-knee  popliteal artery was patent.  It appeared that the anterior tibial  artery occluded approximately 7 cm after its origin.  The peroneal  artery seemed to be in continuity all the way to the level of the ankle.  At this point, it was decided to do a femoral to below-knee popliteal  bypass.  Greater saphenous vein was harvested through several separate  longitudinal incisions along the course of the leg.  The vein was  harvested from the mid calf all the way up to the level of  the  saphenofemoral junction.  The proximal third of the vein was injured  during dissection and there was approximately 2 cm laceration in this.  This was controlled initially with pressure.  The distal greater  saphenous vein was doubly clipped and transected.  The vein was then  removed all the way up to the level of the saphenofemoral junction.  This was ligated with a 2-0 silk tie.  The vein was then inspected and  gently distended with heparinized saline.  The area of laceration on the  proximal third could not be salvaged as there was a linear tear for  approximately 2 cm and also a tear in the back wall of the vein at this  location as well.  Therefore, this 2 cm of vein was excised.  The vein  was then placed with both segments and reversed configuration and  spatulated.  The two segments were then sewn together end-to-end using a  running 7-0 Prolene suture.  The vein was then again inspected and  flushed gently with heparinized saline.  It was found to be hemostatic  at this point.  The vein was then placed in a reversed configuration.  The patient had been given an additional 5000 units of heparin on two  occasions during the course of the case of dissection and placement of  the fem-pop bypass.  After appropriate heparin circulation time, the  left common femoral artery was again controlled with a Cooley clamp.  The profunda  was controlled with a fistula clamp and a longitudinal  opening was made in the native common femoral artery approximately 1 cm  above where the Dacron patch extended onto the profunda femoris.  The  vein was placed in a reversed configuration and spatulated.  It was then  sewn end of vein to side of common femoral artery using a running 5-0  Prolene suture.  Just prior to completion of anastomosis, this was fore  bled, back bled, and thoroughly flushed.  Anastomosis was secured.  Clamps were released.  There was pulsatile flow in the vein graft  immediately.  There was still pulsatile flow in the profunda.  At this  point, a deep subsartorial tunnel was made from between the heads of the  gastrocnemius muscle all the way up to the level of the common femoral  artery.  The vein was marked for orientation and then brought through  the subsartorial tunnel.  The vein was then unclamped and found to still  have good pulsatile flow.  This was then reoccluded with a fine bulldog  clamp.  The longitudinal opening in the below-knee popliteal artery was  then extended approximately three times the diameter of the artery to  make sure that the patch and hood of the anastomosis would be over the  area of plaque within the mid segment of the anastomosis.  The leg was  straightened and the graft cut to length and spatulated.  The vein graft  was then sewn end of vein to side of artery using a running 6-0 Prolene  suture.  Just prior to completion of anastomosis, this was fore bled,  back bled, and thoroughly flushed.  Anastomosis was secured.  Clamps  were released.  There was pulsatile flow in the below-knee popliteal  artery immediately.  There was good Doppler flow in the bypass graft and  then native popliteal artery.  There were Doppler signals in the  peroneal, posterior tibial, and dorsalis pedis at this point.  Next, a  completion arteriogram was obtained by introducing a 21 gauge butterfly   needle into the proximal portion of vein graft.  This was done with  inflow occlusion.  This showed a patent distal anastomosis to the below-  knee popliteal artery.  The anterior tibial artery occluded in the  proximal third.  The peroneal artery occluded in the middle half of the  leg and then reconstituted distally via collaterals and was the primary  runoff vessel to the foot.  At this point, it was decided that this  would be reasonable outflow for the bypass graft and that further  interventions at this point would have diminishing returns.  At this  point, hemostasis was obtained.  The patient was given a total of 80 mg  of protamine.  All incisions were closed with running 2-0 Vicryl and 3-0  Vicryl suture in the subcutaneous layers and then staples in the skin.  The 21-gauge butterfly needle in the proximal vein graft was removed and  the hole reapproximated using a 7-0 Prolene suture.  A Jackson-Pratt  drain was brought out through the groin incision and also through the  thigh incision since there was still some oozing in both of these  vascular beds after the large doses of heparin that the patient had  received.  The patient tolerated the procedure well and there were no  complications.  Instrument, sponge, and needle counts were correct at  the end of the case.  The patient was taken to the recovery room in  stable condition.      [name], MD  Electronically Signed     CEF/MEDQ  D:  01/07/2009  T:  01/08/2009  Job:  [id number]

## 2011-04-18 NOTE — Op Note (Signed)
[name], [name]             ACCOUNT NO.:  [id number]   MEDICAL RECORD NO.:  [id number]          PATIENT TYPE:  AMB   LOCATION:  SDS                          FACILITY:  [facility]   PHYSICIAN:  Theotis Burrow IV, MDDATE OF BIRTH:  [birth year]   DATE OF PROCEDURE:  04/20/2009  DATE OF DISCHARGE:                               OPERATIVE REPORT   PREOPERATIVE DIAGNOSIS:  Bypass graft stenosis, left.   POSTOPERATIVE DIAGNOSIS:  Bypass graft stenosis, left.   PROCEDURES PERFORMED:  1. Ultrasound access, right femoral artery.  2. Abdominal aortogram.  3. Left leg runoff.  4. Third-order catheterization.  5. Percutaneous transluminal angioplasty, left femoral-popliteal      bypass graft.  6. Follow up x1.   INDICATIONS:  Ms. [name] is an 77 year old female, who has recently  undergone a left femoral below-knee bypass with spliced vein.  She  developed progressive increase in the velocities in her bypass graft.  She comes in today for evaluation.   PROCEDURE:  The patient was identified in the holding area and taken to  room 8, where she was placed supine on the table.  Right groin was  prepped and draped in standard sterile fashion.  A time-out was called.  The right femoral artery was evaluated with ultrasound.  The bypass  graft coming off the femoral artery was easily visualized.  Lidocaine 1%  was used for local anesthesia.  Under ultrasound guidance, the bypass  graft was accessed with a 21-gauge needle.  A mandril wire was advanced  without resistance.  The micropuncture sheath was placed.  Bentson wire  was then placed through the micropuncture sheath into the aorta.  The  micropuncture sheath was removed or replaced with the 5-French sheath.  Omni flush catheter was placed at the level of L1.  Abdominal aortogram  was obtained.  Aortic bifurcation was crossed with Omni flush catheter  and Bentson wire, left leg runoff was obtained.   FINDINGS:  Aortogram:  Visualized  portions of the suprarenal abdominal  aorta showed minimal disease.  High-grade right renal artery stenosis is  visualized.  Left renal arteries were widely patent.  Diffuse disease is  noted throughout the infrarenal abdominal aorta without significant  stenoses.  The right common iliac and external iliac artery are widely  patent.  The right hypogastric artery is patent.  Left common iliac  artery has mild luminal narrowing at its origin.  The left external  iliac artery is patent.  The left hypogastric artery is patent.   Left lower extremity:  Common femoral is widely patent.  The left  profunda femoral artery is widely patent.  There is bypass graft  originating from the distal femoral artery.  There is area of  irregularity in the mid thigh.  The distal anastomosis is to the below-  knee popliteal artery.  There is diffusely diseased tibial vessels.  The  anterior tibial is diffusely diseased with patent across the ankle.  Peroneal artery is also diffusely diseased.   After these images were obtained, a decision was made to proceed with  intervention.  Over a Constance Holster wire,  a 6-French Terumo sheath was placed in  the left common femoral artery.  The patient was given 3000 units of  heparin, and an 0.014 Cooper wire was then advanced into the bypass  graft, past the area of concern.  A Fox SV 2-mm balloon was advanced  across the lesion, it was taken to profile, and there was no stenosis  evaluated.  I then elected to use a 4-mm balloon.  A Fox SV 4 x 4  balloon was advanced across the lesion.  It was taken to 2 atmospheres.  There was a waste, which did resolve it to 2 atmospheres.  Follow up  arteriogram was performed.  There was no evidence of extravasation.  There was a decrease in the luminal irregularity.  With this finding, I  elected to stop the procedure.  Catheters and wires were removed.  Sheath was pulled back to the right external iliac artery, and the  patient will be  taken to holding area for sheath pull.   IMPRESSION:  1. Bypass graft stenosis in the mid thigh likely representing the      portion of spliced vein.  This was gently dilated using a 4-mm      balloon.  2. Severe tibial disease.      Eldridge Abrahams, MD  Electronically Signed     VWB/MEDQ  D:  04/20/2009  T:  04/21/2009  Job:  [id number]

## 2011-04-18 NOTE — Assessment & Plan Note (Signed)
OFFICE VISIT   KYRIE, BARNELL  DOB:  January 31, 1926                                       01/27/2009  [id number]   The patient presents today complaining of pain over the medial malleolus  area of her left leg.  She also has some pain in her left hip.  She  recently underwent thrombectomy of her left external iliac artery with  left femoral to below-knee popliteal bypass on February 4th.  She was  last seen in the office a week ago.  At that time, she had some swelling  in her left lower extremity with no evidence of DVT by duplex  ultrasound.  On exam today, the left foot is warm.  There is biphasic  dorsalis pedis and posterior tibial Doppler signals.  There is still  some swelling but this is slightly improved from last week.  Her  incisions continue to heal.  She has no evidence of ischemia in the left  lower extremity.  She denies any recent trauma.   The patient still has some pain in her left leg.  Most of this is  concentrated around the left medial malleolus and left hip.  There is no  obvious trauma in this occasion and no evidence of ischemia.  She was  given a renewal for her prescription for Percocet, #60 dispensed today.  She will follow up me next week to consider removing the remainder of  the staples.   [name], MD  Electronically Signed   CEF/MEDQ  D:  01/27/2009  T:  01/28/2009  Job:  [id number]

## 2011-04-18 NOTE — Assessment & Plan Note (Signed)
OFFICE VISIT   LIBBIE, KNODLE  DOB:  Dec 24, 1925                                       01/20/2009  [id number]   The patient returns for followup today.  She underwent a thrombectomy of  her left external iliac artery with left external iliac artery  endarterectomy, profundoplasty and left femoral to below knee popliteal  bypass with vein on February 4.  She presented today for removal of her  staples.  However, she has fairly severe edema in the left lower  extremity.  There is no obvious cellulitis.  There is no significant  drainage from the incisions.  There is some maceration of the left groin  incision but overall this does appear to be healing.   The left foot is pink, warm and well-perfused with brisk capillary  refill.  I am unable to palpate pedal or popliteal pulses most likely  secondary to edema.   She had a venous duplex exam today which showed no evidence of DVT.  She  did have some fluid around the graft itself.  There was no air within  this.   The patient has postoperative edema in the left lower extremity.  I  counseled her today on elevating the leg and also in care of her left  groin incision.  We removed every other staple from the left groin  incision but left the other staples intact due to the severe edema I was  worried that the skin edges would dehisce.  She will follow up in two  weeks' time and at that time we will consider removing the remainder of  her staples.  She will wash the left groin wound daily with soap and  water and then dry this afterwards and place a dry gauze dressing on  this.   [name], MD  Electronically Signed   CEF/MEDQ  D:  01/20/2009  T:  01/21/2009  Job:  [id number]

## 2011-04-18 NOTE — Discharge Summary (Signed)
NAMECHERELL, MARKE             ACCOUNT NO.:  [id number]   MEDICAL RECORD NO.:  [id number]          PATIENT TYPE:  INP   LOCATION:  2009                         FACILITY:  [facility]   PHYSICIAN:  [name], MD  DATE OF BIRTH:  [birth year]   DATE OF ADMISSION:  01/07/2009  DATE OF DISCHARGE:  01/11/2009                               DISCHARGE SUMMARY   FINAL DISCHARGE DIAGNOSES:  1. Ischemic left lower extremity with iliac occlusion and occluded      left femoral above-the-knee popliteal Gore-Tex graft.  2. Hypertension.  3. Dyslipidemia.   PROCEDURES PERFORMED:  1. Thrombectomy of left external iliac artery and left external iliac      arterial endarterectomy with left profundoplasty.  2. Left common femoral artery to below-knee popliteal artery bypass      grafting using reverse composite saphenous vein graft in the left      lower extremity with intraoperative angiogram x2 by Dr. Oneida Alar.   COMPLICATIONS:  None.   DISCHARGE MEDICATIONS:  1. Diltiazem 30 mg p.o. q.6 h.  2. Synthroid 75 mcg p.o. daily.  3. Simvastatin 20 mg p.o. nightly.  4. Lodine 400 mg p.o. b.i.d.  5. Hydrocodone 7.5/500 p.o. q.4-6 h. p.r.n. pain, total number of 30      were given.   CONDITION AT DISCHARGE:  Stable and improving.   DISCHARGE DISPOSITION:  She is being discharged home in stable condition  with her wounds healing well.  She is given careful instructions  regarding her activity level and wound care.  She is given a return  appointment to see Dr. Oneida Alar in 3 weeks with ABIs and staple removal in  7-10 days.  The office will arrange visits.   BRIEF IDENTIFYING STATEMENT:  For complete details, please refer the  typed history and physical.  Briefly, this very pleasant woman was  admitted through the emergency room with an ischemic left lower  extremity.  She underwent arteriogram and was found to have occlusion of  her left lower extremity.  Dr. Oneida Alar recommended revascularization.  She was informed of the risks and benefits of the procedure and after  careful consideration, elected to proceed with surgery.  Preoperative  workup continued at an urgent basis.  She was transported to the  emergency room and underwent the aforementioned revascularization.  For  complete details, please refer the typed operative report.  The  procedure was without complications.  She was returned to the [facility], extubated.  Following stabilization, she was  transferred to a bed on a Surgical Step-Down Unit.  She was observed  overnight.  We were able to transfer her to a bed on a surgical  convalescent floor.   Her diet and activity level were advanced as tolerated.  We did feel  that she would need some home health physical therapy.  Arrangements  were made for this.  Her hospital course was benign.  She displayed  steady improvement with physical therapy, and was able to be discharged  on January 11, 2009.      Chad Cordial, PA  [name], MD  Electronically Signed    KEL/MEDQ  D:  01/11/2009  T:  01/12/2009  Job:  [id number]   cc:   Jessy Oto. Oneida Alar, MD

## 2011-04-18 NOTE — Procedures (Signed)
BYPASS GRAFT EVALUATION   INDICATION:  Follow-up evaluation of lower extremity bypass graft.   HISTORY:  Diabetes:  Yes.  Cardiac:  No.  Hypertension:  Yes.  Smoking:  No.  Previous Surgery:  Left femoropopliteal bypass graft on January 07, 2009  composed of composite saphenous vein.  Left thrombectomy of left CIA,  EIA profundoplasty, PTA of mid bypass graft on January 21, 2009.   SINGLE LEVEL ARTERIAL EXAM                               RIGHT              LEFT  Brachial:                    146                130  Anterior tibial:             116                99  Posterior tibial:            101                103  Peroneal:  Ankle/brachial index:        0.79               0.71   PREVIOUS ABI:  Date: May 12, 2009  RIGHT:  0.84  LEFT:  0.77   LOWER EXTREMITY BYPASS GRAFT DUPLEX EXAM:   DUPLEX:  Increased velocity of 350 cm per second noted at the left in-  flow artery, 284 cm per second at the proximal anastomosis and 232 cm  per second at the mid graft.   Biphasic duplex waveform noted within grafts in native artery.   IMPRESSION:  1. Increased velocities noted at the in-flow artery, proximal      anastomosis and mid graft.  2. Bilateral lower extremity ankle-brachial indices suggest mild to      moderate arterial disease with low resistant biphasic Doppler      waveforms.       ___________________________________________  Jessy Oto Fields, MD   AC/MEDQ  D:  07/14/2009  T:  07/14/2009  Job:  [id number]

## 2011-04-18 NOTE — Procedures (Signed)
BYPASS GRAFT EVALUATION   INDICATION:  Follow up left lower extremity bypass graft.   HISTORY:  Diabetes:  Yes.  Cardiac:  No.  Hypertension:  Yes.  Smoking:  No.  Previous Surgery:  Left femoral-popliteal artery bypass graft on  01/07/2009.   SINGLE LEVEL ARTERIAL EXAM                               RIGHT              LEFT  Brachial:                    152                140  Anterior tibial:             160                154  Posterior tibial:            127                140  Peroneal:  Ankle/brachial index:        1.05               1.01   PREVIOUS ABI:  Date: 10/20/09  RIGHT:  1.03  LEFT:  1.04   LOWER EXTREMITY BYPASS GRAFT DUPLEX EXAM:   DUPLEX:  1. Doppler arterial waveforms appear biphasic proximal to, within, and      distal to the left lower extremity bypass graft.  2. Stable elevated velocities at left CFA of 235 cm/s and proximal      anastomosis of 256 cm/s.   IMPRESSION:  1. Patent left femoral-popliteal artery bypass graft with stable      elevated velocities at the proximal anastomosis and inflow.  2. Bilateral ankle brachial indices appear stable.  3. No significant changes from previous study.   ___________________________________________  Jessy Oto Fields, MD   AS/MEDQ  D:  02/09/2010  T:  02/09/2010  Job:  [id number]

## 2011-04-18 NOTE — Assessment & Plan Note (Signed)
OFFICE VISIT   [name], [name]  DOB:  06-15-1926                                       05/12/2009  [id number]   The patient returns for followup today.  She underwent angioplasty of a  vein graft stenosis by Dr. Trula Slade on May 18.  She states that she  occasionally has some pain in her left calf but this is intermittent and  not associated with walking.  She has no rest pain.   Her ABI went from 0.56 pre procedure to 0.77 post procedure.  She has  monophasic waveforms in the left leg.   Overall, the patient appears to be doing well.  The vein graft stenosis  was treated with angioplasty successfully.  We will place her back in  our graft surveillance protocol.  She will follow up as needed in the  protocol.   [name], MD  Electronically Signed   CEF/MEDQ  D:  05/12/2009  T:  05/13/2009  Job:  [id number]

## 2011-04-18 NOTE — Procedures (Signed)
BYPASS GRAFT EVALUATION   INDICATION:  Follow-up evaluation of left fem-pop bypass graft.  Patient  reports some rest pain in the left leg.   HISTORY:  Diabetes:  Yes.  Cardiac:  No.  Hypertension:  Yes.  Smoking:  No.  Previous Surgery:  Left fem-pop bypass graft on 01/07/09 composed of  composite saphenous vein.   SINGLE LEVEL ARTERIAL EXAM                               RIGHT              LEFT  Brachial:                    148                150  Anterior tibial:             122                78  Posterior tibial:            106                48  Peroneal:  Ankle/brachial index:        0.81               0.52   PREVIOUS ABI:  Date: 02/03/09  RIGHT:  0.90  LEFT:  0.72   LOWER EXTREMITY BYPASS GRAFT DUPLEX EXAM:   DUPLEX:  Doppler arterial waveforms are monophasic proximal to, within,  and distal to the fem-pop bypass graft with an elevated peak systolic  velocity of 0000000 cm/s at the mid thigh level of the graft.   IMPRESSION:  1. Right ankle brachial index is stable from previous study and      suggest mild arterial occlusive disease.  2. The left ankle brachial index has decreased significantly since the      previous study and suggests moderate-to-severe arterial occlusive      disease.  3. Vein graft stenosis at the mid thigh portion of the left      femoropopliteal bypass graft.   ___________________________________________  [name], MD   MC/MEDQ  D:  04/07/2009  T:  04/07/2009  Job:  [id number]

## 2011-04-18 NOTE — Assessment & Plan Note (Signed)
OFFICE VISIT   BRYNJA, JASMIN  DOB:  Jul 09, 1926                                       02/03/2009  [id number]   The patient is an 77 year old female who recently underwent thrombectomy  of her left external iliac artery, left external iliac artery  endarterectomy, left profundoplasty and left common femoral to below  knee popliteal bypass with reversed spliced greater saphenous vein.  This was done on 01/07/2009.  She returns for followup today.  She has  had some postoperative edema.  She previously had a duplex exam which  showed no evidence of DVT.   On exam today the foot is warm and well-perfused.  Her ABI today was 0.7  in that leg.  The edema is slowly resolving.  She has no wounds on her  foot.  The incisions are healing well.  Staples were removed today.  Blood pressure today was 138/71 in the left arm, pulse 49 and regular.   In summary, the patient is doing well status post left iliac  endarterectomy with left fem-pop.  She will return in June of 2010 for  graft surveillance and be placed in our graft surveillance protocol at  that time.  She will return sooner if she has any difficulties.   [name], MD  Electronically Signed   CEF/MEDQ  D:  02/03/2009  T:  02/04/2009  Job:  Lurline Hare

## 2011-04-18 NOTE — Procedures (Signed)
BYPASS GRAFT EVALUATION   INDICATION:  Followup left lower extremity bypass graft.   HISTORY:  Diabetes:  Yes.  Cardiac:  No.  Hypertension:  Yes.  Smoking:  No.  Previous Surgery:  Left fem-pop bypass graft on 01/07/2009.   SINGLE LEVEL ARTERIAL EXAM                               RIGHT              LEFT  Brachial:                    146                148  Anterior tibial:             152                154  Posterior tibial:            86                 132  Peroneal:  Ankle/brachial index:        1.03               1.04   PREVIOUS ABI:  Date:  07/14/2009  RIGHT:  0.79  LEFT:  0.71   LOWER EXTREMITY BYPASS GRAFT DUPLEX EXAM:   DUPLEX:  Biphasic Doppler waveforms noted throughout the left lower  extremity bypass graft and its native vessels with mildly increased  velocities of 241 cm/s, 232 cm/s and 158 cm/s noted in the left common  femoral artery, proximal anastomosis and mid graft levels, respectively.   IMPRESSION:  1. Patent left femoral-popliteal bypass graft with increased      velocities noted, as described above.  2. Improvement of the bilateral ankle brachial indices noted.         ___________________________________________  Jessy Oto Fields, MD   CH/MEDQ  D:  10/21/2009  T:  10/21/2009  Job:  [id number]

## 2011-04-18 NOTE — Assessment & Plan Note (Signed)
OFFICE VISIT   [name], [name]  DOB:  1926-09-09                                       10/06/2009  [id number]   The patient is an 77 year old female who has previously undergone a left  fem-pop bypass by Dr. Nils Pyle several years ago.  She underwent  thrombectomy of her left external iliac artery with endarterectomy and  redo left femoral to below knee popliteal bypass with vein in February  of 2010.  She subsequently had an angioplasty performed of this by Dr.  Trula Slade in May of 2010.  She presents to the office today complaining of  pain primarily in her left distal ankle and occasionally in her left  foot.  She also has some occasional pain in her toes.  This mainly  occurs at night time in bed.  She states that the pain is somewhat  relieved by Tylenol but not complete relieved.   Other chronic medical problems include diabetes and hypertension.  These  are all fairly well-controlled.   REVIEW OF SYSTEMS:  Full review of systems was performed today.  This is  primarily remarkable for arthritis and joint pain in her legs.  Please  see intake referral form for full details.   PHYSICAL EXAM:  Blood pressure is 130/52 in the right arm, pulse is 40  and regular.  Temperature is 97.9.  Heart rate is 40 and regular.  Lower  extremities she has 2+ femoral pulses bilaterally.  She has a 2+ left  popliteal pulse.  She has monophasic Doppler signals in the posterior  tibial and dorsalis pedis area in the left foot.  These are slightly  greater than the right foot.  Both feet are pink, warm and well-  perfused.   Recent ABIs performed in August of 2010 were 0.71 on the left, 0.79 on  the right.   Overall I believe the patient is doing okay.  Her symptoms do not sound  completely consistent with problems related to arterial occlusive  disease in the left lower extremity.  I am more inclined to believe that  this is more arthritis and degenerative  joint disease in nature  especially since she complains primarily of ankle pain rather than  classic rest pain and she denies any claudication symptoms whatsoever.  I believe the best option for her right now is pain control.  I  prescribed Darvocet #40 today.  Hopefully this will improve her over the  next few weeks and she will not need it further.  She will follow up in  our continued graft surveillance protocol for intermittent checks on her  bypass graft.   [name], MD  Electronically Signed   CEF/MEDQ  D:  10/07/2009  T:  10/07/2009  Job:  [id number]   cc:   Lucianne Lei, M.D.

## 2011-04-18 NOTE — Assessment & Plan Note (Signed)
OFFICE VISIT   CHAMERE, OHNESORGE  DOB:  1926/07/20                                       04/14/2009  [id number]   The patient returns for follow-up today after recent graft surveillance  duplex showed a possible narrowing in her left fem-pop bypass graft.  She had an arterial duplex exam performed on February 03, 2009, showed an  ABI of 0.9 on the right and 0.72 on the left.  ABIs on Apr 07, 2009, were  0.81 on the right and 0.52 on the left.  There was an elevated velocity  of 546 cm/s in the mid thigh, potentially at the site of  venovenoanastomosis.   The patient denies any claudication symptoms, although she will only  ambulate approximately 10-15 minutes at a time.  She states that she  does have some occasional tingling in her left foot but she denies any  rest pain, she has no ulcerations on the feet.  She previously had  undergone thrombectomy of her left external iliac artery with  endarterectomy and left profundoplasty with left common femoral to below-  knee popliteal bypass using composite vein on January 07, 2009.  Prior  to this, she had previously had a left fem-pop bypass done several years  ago by Dr. Nils Pyle.  Of note, she has previously had the saphenous vein  removed from her right leg for coronary artery bypass grafting.   PAST MEDICAL HISTORY:  Significant for:  1. Borderline diabetes.  2. Hypertension.  3. Hypothyroidism.   PAST SURGICAL HISTORY:  Is as above.  She has also had a hysterectomy, a  colon resection for diverticulosis with permanent right lower quadrant  colostomy and a left shoulder operation.   MEDICATIONS:  1. Diltiazem 30 mg q.6 h.  2. Hydrocodone 7.5/500 p.r.n.  3. Synthroid 75 mcg once a day.  4. Lodine 400 mg twice a day.  5. Simvastatin 20 mg nightly.   She has no known drug allergies.   PHYSICAL EXAMINATION:  Blood pressure is 135/68 in the left arm, pulse  is 45 and regular.  HEENT:  Unremarkable.   Neck:  Has 2+ carotid pulses  without bruit.  Chest:  Clear to auscultation.  Cardiac:  Regular rate  and rhythm, without murmur.  Abdomen:  Soft, nontender, nondistended  with a right lower quadrant ostomy.  Extremities:  She has 2+ femoral  pulses bilaterally.  She has absent popliteal and pedal pulses  bilaterally.  Feet are pink, warm and well-perfused bilaterally.   SUMMARY:  The patient has a high-grade stenosis in the midportion of her  left fem-pop bypass graft by duplex exam.  I believe the best option for  this would be an angiogram with possible angioplasty of this stenosis if  it is a mid graft stenosis.  I also discussed with the patient and her  family today that this may require revision or possible redo bypass.  We  will make further plans regarding her disposition based on the  arteriographic findings.  The patient had a conflict with scheduling her  arteriogram for this Friday as her family was going to be out of town,  in [place], for a graduation ceremony.  She preferred to defer the  arteriogram until next week.  We did discuss the fact that the graft is  at risk for occlusion, but  she wished to delay the arteriogram in spite  of this.  Instead, the arteriogram is scheduled for Tuesday, May 18th,  with Dr. Trula Slade.   [name], MD  Electronically Signed   CEF/MEDQ  D:  04/14/2009  T:  04/15/2009  Job:  [id number]

## 2011-04-21 NOTE — Discharge Summary (Signed)
NAME:  [name], [name]                       ACCOUNT NO.:  [id number]   MEDICAL RECORD NO.:  [id number]                   PATIENT TYPE:  INP   LOCATION:  [id number]                                 FACILITY:  [facility]   PHYSICIAN:  Carole Civil, M.D.           DATE OF BIRTH:  May 22, 1926   DATE OF ADMISSION:  07/29/2004  DATE OF DISCHARGE:  07/28/2004                                 DISCHARGE SUMMARY   ADMITTING DIAGNOSIS:  Osteoarthritis, left shoulder.   PROCEDURE:  Left hemiarthroplasty, left shoulder.   IMPLANTS USED:  Stryker Solaris system, size 11 stem with a 4015 head.   HISTORY:  A 77 year old female with longstanding left shoulder pain and  arthritis.  She put this surgery off for approximately six or seven years  due to the necessary care of her daughter who is an adult but needs full-  time care.  The pain became unbearable.  She presented for surgery.   HOSPITAL COURSE:  After admission, she was taken to surgery.  She had a left  hemiarthroplasty of the shoulder.  She did well postoperatively, except her  therapy with the occupation therapist did not progress as well as we would  like.  I performed some exercises with her on Saturday and again on Sunday.  I was able to get flexion of 70 degrees and external rotation of 20 degrees.  The therapists were not able to get any real motion.   This will need to be worked on through the pain in occupational therapy.  She is not allowed to not move the arm with the therapist.  This will be  emphasized with the occupational therapist.   The patient will be discharged on all of her routine medications, regular  diet as she did before surgery.  She can bathe.  No driving for six weeks,  no lifting left arm for 12 weeks.  She is able to move her elbow, wrist, and  hand as tolerated.  She will go home with a CryoCuff.  No external rotation  actively past neutral, passively to 20 degrees, flexion and abduction as  tolerated, pendulums  as tolerated.   DISCHARGE MEDICATIONS:  1. Lorcet 10/650 1 q.4 p.r.n. for pain.  2. Skelaxin 40 mg p.o. q.8 p.r.n.   FOLLOW UP:  August 10, 2004 for staple removal.     ___________________________________________                                         Carole Civil, M.D.   SEH/MEDQ  D:  07/31/2004  T:  07/31/2004  Job:  [id number]

## 2011-04-21 NOTE — Op Note (Signed)
[name], [name]             ACCOUNT NO.:  [id number]   MEDICAL RECORD NO.:  [id number]          PATIENT TYPE:  AMB   LOCATION:  SDS                          FACILITY:  [facility]   PHYSICIAN:  Odis Hollingshead, M.D.DATE OF BIRTH:  03-29-1926   DATE OF PROCEDURE:  08/31/2006  DATE OF DISCHARGE:  08/31/2006                                 OPERATIVE REPORT   PREOPERATIVE DIAGNOSIS:  Retained port-A-Cath.   POSTOPERATIVE DIAGNOSIS:  Retained port-A-Cath.   PROCEDURE:  Port-A-Cath removal.   SURGEON:  Odis Hollingshead, M.D.   ANESTHESIA:  MAC plus local (1% lidocaine with epinephrine and 0.5% plain  Marcaine).   INDICATIONS:  This 77 year old female had a long, drawn-out illness  requiring a long hospitalization and had a port-A-Cath placed for long-term  venous access.  She no longer needs that, and now she presents for removal.   TECHNIQUE:  She was seen in the holding area, and the right upper chest wall  port-A-Cath site marked my initials.  She was then brought to the operating  room and placed supine on the operating table, and given intravenous  sedation.  The right upper chest wall was sterilely prepped and draped.  Local anesthetic was infiltrated superficially and deep at the site of the  previous incision.  The previous incision was reincised through the skin and  subcutaneous tissue, and the port was identified.  The catheter was  identified and removed from the vein, and then pressure was held in the  subclavian area.  I then cut the sutures of the port and removed the port  chest wall.  Bleeding was controlled with electrocautery.   Once hemostasis was adequate, the wound was closed in two layers using 3-0  Vicryl running suture for the subcutaneous tissue and a 4-0 Monocryl  subcuticular stitch for the skin.  Steri-Strips and a sterile dressing were  applied.   She tolerated the procedure without any apparent complications and was taken  to the recovery room  in satisfactory condition.      Odis Hollingshead, M.D.  Electronically Signed     TJR/MEDQ  D:  08/31/2006  T:  09/01/2006  Job:  [id number]

## 2011-04-21 NOTE — Op Note (Signed)
NAME:  [name], [name]                       ACCOUNT NO.:  [id number]   MEDICAL RECORD NO.:  [id number]                   PATIENT TYPE:  AMB   LOCATION:  DAY                                  FACILITY:  [facility]   PHYSICIAN:  Carole Civil, M.D.           DATE OF BIRTH:  07/31/1926   DATE OF PROCEDURE:  07/29/2004  DATE OF DISCHARGE:                                 OPERATIVE REPORT   HISTORY:  A 77 year old female with end-stage glenohumeral arthritis,  presented with pain and loss of motion.  She tried nonoperative therapy for  years, and that was unsuccessful as the arthritis progressed.  She presented  for left hemiarthroplasty versus total shoulder.   INDICATION:  Pain.   PREOPERATIVE DIAGNOSIS:  Arthritis, left shoulder.   POSTOPERATIVE DIAGNOSIS:  Arthritis, left shoulder.   PROCEDURE:  Left hemiarthroplasty.   SURGEON:  Carole Civil, M.D.   ANESTHESIA:  General.   IMPLANTS USED:  Solaris Stryker size 11 stem, 40/15 head.   PROCEDURE:  The patient was identified as [name].  Her left  shoulder was marked as the operative site.  She was given Ancef and taken to  the operating room for a general anesthetic.  We then did a prep and drape,  took a time out, confirmed her procedure, extremity, and patient identity.  Everyone agreed and we proceeded as noted below.   A deltopectoral incision was used.  The cephalic vein was preserved.  The  deltopectoral interval was entered.  The clavipectoral fascia was dissected  until the strap muscles were identified.  A retractor was placed bluntly  under the coracobrachialis muscle after palpation of the musculocutaneous  nerve.  The arm was maximally externally rotated.  The three sisters were  coagulated.  The inferior and superior border of the subscapularis muscle  were identified and then approximately 2 cm from the lesser tuberosity, the  muscle and capsule were retracted medially.  There was a significant  soft  tissue contracture.  The axillary nerve was difficult to identify, but it  was palpated and protected.  A capsular release was performed.  The head was  dislocated posteriorly and the glenoid was inspected.  Glenoid had greater  than 50% coverage of cartilage, although there was approximately 50% loss in  terms of thickness.  It was decided that only a hemiarthroplasty was needed.  An osteophyte was removed from the inferior portion of the humeral neck.  The arm was extended.  The starter reamer was placed just posterior to the  bicipital groove and we reamed up to a size 10/11, broached up to a 10/11,  trialled with a 10/11 and a 40/15.  The 40/15 had the best motion and  stability.  The wound was irrigated.  The 11 stem was passed with a good  fit, the 40/15 head was placed also with a good fit.   External rotation was possible to 30 degrees with  the capsule and  subscapularis in its anatomic position.  With 90 degrees of external  rotation there was still no dislocation of the head.  There was good  internal rotation to the abdomen with no posterior capsular tension.   The capsular-subscapularis construct was repaired anatomically with  interrupted 2-0 Ethibond suture and was tested for external rotation.  There  was approximately 35 degrees of external rotation before there was tension  on the suture line, which should allow for easy 0-20 degrees passive  rotation postop.   Flexion was greater than 100 degrees, abduction was 90 degrees with no  tension.   Sensorcaine with epinephrine 10 mL was injected into the joint.  The  deltopectoral interval was allowed to close normally.  The pain pump  catheter was placed in the subcu space.  A Hemovac drain was placed under  the deltopectoral interval and the subcu tissue was closed with 2-0 Vicryl,  skin with staples, a sterile bandage was applied.  The patient was placed in  a Cryo-Cuff, extubated, and taken to the recovery room in  stable condition.   POSTOPERATIVE PLAN:  Bed rest today.  She will start on a PCA, get 24 hours  of antibiotics, start occupational therapy tomorrow with 0-20 degrees  passive external rotation with the arm at her side, abduction 0-90, flexion  0-90 as tolerated, and expected date of discharge will be Sunday, August 28.      ___________________________________________                                            Carole Civil, M.D.   SEH/MEDQ  D:  07/29/2004  T:  07/29/2004  Job:  [id number]

## 2011-04-21 NOTE — H&P (Signed)
[name], [name]             ACCOUNT NO.:  [id number]   MEDICAL RECORD NO.:  [id number]          PATIENT TYPE:  INP   LOCATION:  A318                          FACILITY:  [facility]   PHYSICIAN:  Vernon Prey. [name], M.D.   DATE OF BIRTH:  02-19-1926   DATE OF ADMISSION:  11/20/2005  DATE OF DISCHARGE:  [place]                                HISTORY & PHYSICAL   This is a repeat admission for this 77 year old female who has a chronic  course related to diverticulitis with phlegmon starting back in early  August. The patient was hospitalized August 28, 2005 through October 05, 2005 for chronic diverticulitis with phlegmon. She underwent sigmoid  colectomy with a coloproctostomy during that time. She had a colovaginal  fistula. She also had ventilatory-dependent respiratory failure, acute right  lower lobe pneumonia, severe thrombocytopenia, anemia, acute renal failure,  and congestive heart failure. The patient recovered from that and was  transferred to the [facility]. At that hospital, she had a  transverse loop colostomy as a diversion for the colovaginal fistula. She  was discharged home and was re-admitted at  [facility] for  continued supportive care. She has subsequently been discharged home again,  and now she presents with progressive debility, inability to eat or drink.  She is becoming progressively dehydrated, and it is felt that she needs to  be re-admitted, even though she is receiving TPN at home. It is very  difficult for her to be cared for in the home setting at this time.   PAST HISTORY:  She has severe protein calorie malnutrition, severe debility  and deconditioning, hypertension, diabetes mellitus, osteoarthritis, severe  depression, colovaginal fistula, history of severe sinus bradycardia, prior  history of urinary tract infections, prior history of thrombocytopenia, and  prior history of septicemia.   PAST SURGERY:  Sigmoid colectomy with  coloproctostomy on August 29, 2005,  transverse loop colostomy October 09, 2005. Other surgery includes right  hemiarthroplasty of the left shoulder, carpal tunnel release, hysterectomy.   PRESENT MEDICATIONS:  1.  Oxycodone 5/500 q.6 h.  2  Xanax 1 mg a.c. and h.s.  1.  Doxycycline 100 mg b.i.d.  2.  Diltiazem 30 mg q.i.d.  3.  She is getting TNA at 75 cc/hour.   ALLERGIES:  None.   PHYSICAL EXAMINATION:  GENERAL:  She is a very frail-appearing elderly  female who looks severely depressed and very withdrawn.  VITAL SIGNS:  Blood pressure is 130/68, pulse 90, respirations 24,  temperature 97.4. HEENT:  Unremarkable.  NECK:  Supple, no JVD, bruit, adenopathy, or thyromegaly.  CHEST:  Clear in all lung fields, no rales, rubs, rhonchi, or wheezes.  HEART:  Regular rate and rhythm without murmur, gallop, or rub.  ABDOMEN:  Well-healed midline incision. There is a right upper quadrant  colostomy with some abrasions around the stomal site from leakage.  She is  very tender in this area.  EXTREMITIES:  No cyanosis, clubbing, or edema.  NEUROLOGIC EXAM:  Nonfocal. She has some decreased general strength but no  lateralizing motor weakness.   IMPRESSION:  1.  Recurrent abdominal pain with intractable nausea and vomiting.  2.  Prerenal azotemia.  3.  Severe protein calorie malnutrition.  4.  Severe debility with deconditioning.  5.  Hypertension.  6.  Diabetes mellitus.  7.  Osteoarthritis.  8.  Chronic severe depression.  9.  Colovaginal fistula.   PLAN:  The patient is admitted.  Her TNA will be continued.  She will be  given supplemental IV fluids.  Her nausea and vomiting will be treated  symptomatically.  We will try to slowly advance his diet.  We will try to  change her antidepression medications and will get physical therapy started.  It is felt that we need to convince the patient to be transferred to a  skilled nursing facility for rehabilitation since she is very weak  and  cannot be adequately treated at home at this time.      Vernon Prey. [name], M.D.  Electronically Signed     LCS/MEDQ  D:  12/04/2005  T:  12/04/2005  Job:  [id number]

## 2011-04-21 NOTE — H&P (Signed)
NAME:  [name], [name]                       ACCOUNT NO.:  [id number]   MEDICAL RECORD NO.:  [id number]                   PATIENT TYPE:  EMS   LOCATION:  ED                                   FACILITY:  [facility]   PHYSICIAN:  Epifania Gore. Nils Pyle, M.D.             DATE OF BIRTH:  11-07-1926   DATE OF ADMISSION:  01/29/2004  DATE OF DISCHARGE:                                HISTORY & PHYSICAL   ADMITTING DIAGNOSIS:  Postoperative wound infection, right groin.   HISTORY OF PRESENT ILLNESS:  [name] is a 77 year old lady who underwent  a femoral-popliteal bypass 7 days ago.  She presented to the emergency room  with some drainage.  She denies fever.  In the emergency room the wound was  warm with a serosanguineous drainage.  The wound was opened in the emergency  room and packed open with iodoform irrigation and gauze.  We will admit the  patient to the hospital for observation and wound care.  Past medical  history and system review are well documented in her most recent admission.  Please refer thereto.   PHYSICAL EXAMINATION:  GENERAL:  She is an alert, oriented female in some  moderate distress, complaining of pain in her right groin.  VITAL SIGNS:  Temperature 99 degrees orally, blood pressure 157/87, pulse  rate of 96, respirations are 18.  HEENT:  Clear.  NECK:  Supple.  Trachea is midline.  Thyroid is nonpalpable.  LUNGS:  Clear.  BREASTS:  Pendulous.  ABDOMEN:  Soft with a well-healed post surgical incision in the midline.  In  the right groin there is hyperemia and a serosanguineous drainage with  hyperemia around the staple line.  Staples were removed and the subcutaneous  incision opened and irrigated and packed as per above.  EXTREMITIES:  The foot itself is warm.  The distal incision is taut but has  no drainage.  The left lower extremity is normal.  NEUROLOGIC:  The patient is grossly intact.   The laboratory will include a CBC and a BMET which are in progress.   IMPRESSION:  Wound infection.   PLAN:  Admit to the hospital.  We will initiate Ancef 500 mg IV q.6h.  pending return of the culture.     ___________________________________________                                         Epifania Gore Nils Pyle, M.D.   Rondel Oh  D:  01/29/2004  T:  01/29/2004  Job:  [id number]

## 2011-04-21 NOTE — Consult Note (Signed)
NAME:  [name], HOYE                       ACCOUNT NO.:  [id number]   MEDICAL RECORD NO.:  [id number]                    PATIENT TYPE:   LOCATION:                                       FACILITY:   PHYSICIAN:  Lindaann Slough, M.D.            DATE OF BIRTH:   DATE OF CONSULTATION:  10/02/2002  DATE OF DISCHARGE:                                   CONSULTATION   CHIEF COMPLAINT:  Neck and shoulder pain.   Dear [name]:   Thank you for this consultation.  The patient is a 77 year old black female  who has severe arthritis to her bilateral shoulders.  I reviewed a November 25, 2001 right shoulder x-ray which shows nearly bone on bone glenohumeral  joint loss along with a large bulky inferior humeral head osteophyte.  She  had downsloping acromion to suggest also a possible chronic tendinitis.  She  had the bilateral shoulders injected by Dr. Luna Glasgow approximately eight  months ago with two weeks of improvement.  She has a great deal of constant  aching into the bilateral arms.  This pain can keep her awake at night.  She  also hurts into the right thumb.  There is very little pain into the  bilateral forearms.  She is having difficulty moving her handicapped  daughter at home because of the pain she is having in the shoulder and neck.  She has found that a pain patch helps but it is quite expensive.  She has  been tried on oral pain medicines without significant relief.   REVIEW OF SYSTEMS:  She denies any fever, rashes, weight loss, headaches,  vision changes, or jaw claudication.  She occasionally has some mild  dizziness when standing from a sitting position.  Her energy level is better  at this time and she attributes this to taking an MDI.  She has little  stiffness in the morning and denies any joint swelling.  She has not had  diarrhea, blood, or mucus in the bowel movement, but occasionally does have  constipation.  She denies chest pain, shortness of breath and there  is no  low back pain.   PAST MEDICAL/SURGICAL HISTORY:  1. Right carpal tunnel surgery.  2. Cholecystectomy 1996.  3. Hysterectomy.  4. Appendectomy.  5. Tonsillectomy.  6. Left leg bypass surgery.  7. Hypertension.  8. Arthritis.   MEDICATIONS:  1. Verapamil 240 mg b.i.d.  2. Celebrex 200 mg b.i.d.  3. Multivitamin.   DRUG INTOLERANCES:  None.   FAMILY HISTORY:  Her father lived to the age of 56 and he had a heart  attack late in life.  He had some type of arthritis.  Her mother died at age  67 after strokes.   SOCIAL HISTORY:  She is a [place] native.  She is married and lives with  her husband and a nephew also lives with them.  Her daughter is handicapped  with hydrocephalus.  Her daughter is able to go out to the [employer]  The patient worked there for 25 years.  She never  smoked and does not drink alcohol.   PHYSICAL EXAMINATION:  VITAL SIGNS:  Weight 163 pounds, height 5 feet 1  inch, blood pressure 190/94, respirations 16.  GENERAL:  She is overall a healthy appearing woman.  SKIN:  Clear.  HEENT:  PERL/EOMI.  Mouth clear.  NECK:  Negative JVD.  Normal thyroid.  LUNGS:  Clear.  HEART:  Regular with no murmur.  ABDOMEN:  Negative HSM.  Nontender.  MUSCULOSKELETAL:  The hands show minor degenerative changes with nodular  DIPs and some squaring at the first CMCs.  These areas were nontender.  The  wrists, elbows good range of motion.  The shoulders have at least 50%, if  not more, decreased internal and external rotation.  She was only able to  abduct the shoulders to about 60 degrees bilaterally.  The muscles around  the tops of the shoulder and neck were tense and tender.  Her neck had some  decreased range of motion but this caused no radiating pain.  Palpation  along the spine to the low back was nontender.  Hips:  Good range of motion.  The knees were cool.  There was no swelling but they did have limited  flexion to  approximately 120 degrees bilaterally.  The ankles and feet were  nontender.  NEUROLOGIC:  The strength in the deltoids and thighs were 5/5.  DTRs were 2+  throughout.  Strength in her fingers are 5/5, particularly at the index and  thumbs on the right.  There was no atrophy to the finger muscles.   ASSESSMENT/PLAN:  1. Shoulder arthritis.  I showed the patient her right shoulder x-ray which     is quite impressive with the degree of arthritis change and loss of     glenohumeral joint space.  I can understand why an injection helped only     for a short period of time.  If anything can be done for the shoulders,     it would probably be surgery if there are any problems that would help.     I believe she is on correct medicines being NSAID and then pain medicine.     The Duragesic helps, but does cost a significant amount.  2. Neck pain.  Because she is having a radiating type of pain into the right     thumb, I would want to have her neck x-rayed.  I suspect that there is a     significant amount of arthritis and we may want to have her go for an     MRI.  This could be a separate problem from what is going on with her     neck.  3. Hypertension.  She said that she had not taken her blood pressure     medicines this morning and we have encouraged her to do so.  4. Hysterectomy.  5. Insomnia.  I offered to give her a medicine for this but she responded     that she needs to remain alert to help her daughter during the night and     I can certainly understand this.   [name], the patient has very severe arthritic changes to her elbows as I am  sure you are well aware of.  I did review the bone scan of a couple years  ago which  showed activity in the left foot and particularly the right  shoulder.  I wish  I had more to offer her.  I will await the results of the cervical x-ray to  decide on whether she needs an MRI or not.  At the present time I am not setting up a return appointment  and will evaluate things to see if I need to  see her if I have something more to offer.  Thank you again for allowing me  to evaluate this very pleasant woman.                                               Lindaann Slough, M.D.    WWT/MEDQ  D:  10/02/2002  T:  10/03/2002  Job:  [id number]   cc:   Barrie Folk. [name], M.D.  P.O. [id number]  [address]  [place] [address]  Fax: [phone]

## 2011-04-21 NOTE — Op Note (Signed)
NAMEJONATHON, DOOR             ACCOUNT NO.:  [id number]   MEDICAL RECORD NO.:  [id number]          PATIENT TYPE:  INP   LOCATION:  6713                         FACILITY:  [facility]   PHYSICIAN:  Vernon Prey. [name], M.D.   DATE OF BIRTH:  [birth year]   DATE OF PROCEDURE:  08/29/2005  DATE OF DISCHARGE:  10/20/2005                                 OPERATIVE REPORT   PREOPERATIVE DIAGNOSIS:  Chronic diverticulitis.   POSTOPERATIVE DIAGNOSIS:  Chronic diverticulitis with phlegmon and  colovaginal fistula.   PROCEDURE:  Sigmoid colectomy with coloproctoscopy.   SURGEON:  Dr. [name].   DESCRIPTION:  Under general anesthesia, the patient's abdomen was prepped  and draped in a sterile field. Lower midline incision was made. Upon  entering the abdomen, there was a large phlegmon of the sigmoid colon with  dense adhesions to the posterior aspect of the bladder. It was elected to do  a primary resection with an anastomosis if no acute purulence was  encountered with the dissection. The left colon was mobilized using standard  technique. It was mobilized up to the splenic flexure. Splenic flexure was  taken down to allow more length. The distal sigmoid was then further  dissected down deep into the pelvis. There were dense adhesions of the large  phlegmon to posterior aspect of the bladder. No direct connection between  the phlegmon and the vagina could be found although there was some bleeding  from the apex of the vagina during the dissection. This was controlled with  ligatures of 3-0 Monocryl. Once the colon was freed up, it was apparent that  the rectum beneath the peritoneal reflection was not significantly involved,  and there was no purulence encountered. The rectum was doubly clamped and  divided below the peritoneal reflection. The proximal rectum and sigmoid  were then excised. The mesentery was doubly clamped, divided and controlled  with ligatures of 2-0 silk. The colon was divided mid  descending level. Once  this was done, copious irrigation was carried out. Concern was as to whether  or not to do an anastomosis. It was felt that since the rectum was not  involved and since there was no connection between the rectum and the vagina  that we could proceed with an anastomosis, but it would have to be hand  sewn. The end of the rectal stump was then sewn to the side of the  descending colon in two layers using 2-0 Monocryl as an inner layer and 3-0  Prolene as an outer layer. This was performed without tension. Mesenteric  defect was closed. Two JP drains were placed, one beneath the peritoneal  reflection and the other above the anastomosis. There were pulled out  through separate stab incisions. Further irrigation was carried out.  Mesenteric defect was closed with interrupted 3-0 Monocryl. There was a  small amount of bleeding at the apex of the vagina. This was dissected, and  it was apparent there was a small opening in this area which was more like a  pin hole opening. It was not in direct continuity with the anastomosis, and  it  was felt that it could be adequately drained. It was elected therefore to  control the bleeding and not to do further dissection because its position  at the base of the bladder could potentially lead to difficulty with  ureteral injury. The area was irrigated. The drain was positioned directly  over the opening. The abdomen was then closed after verifying the sponge,  needle, instrument and blade counts were correct. Fascia was closed with #1  Prolene. Subcutaneous tissue  was closed with 2-0 Vicryl. Skin was closed with staples. JP drains were  secured with 3-0 nylon. The patient tolerated the procedure well. Sterile  dressing was placed. She was awakened from anesthesia uneventfully,  transferred to a bed, and taken to the post-anesthesia care unit for further  monitoring.      Vernon Prey. [name], M.D.  Electronically Signed      LCS/MEDQ  D:  11/12/2005  T:  11/13/2005  Job:  [id number]

## 2011-04-21 NOTE — Consult Note (Signed)
[name], [name]             ACCOUNT NO.:  [id number]   MEDICAL RECORD NO.:  [id number]          PATIENT TYPE:  INP   LOCATION:  IC04                          FACILITY:  [facility]   PHYSICIAN:  Scarlett Presto, M.D.   DATE OF BIRTH:  [birth year]   DATE OF CONSULTATION:  10/02/2005  DATE OF DISCHARGE:                                   CONSULTATION   PRIMARY CARE PHYSICIAN:  Barrie Folk. [name], MD   REFERRING PHYSICIAN:  Vernon Prey. [name], M.D.   HISTORY OF PRESENT ILLNESS:  [name] is a woman with a multitude of  medical problems, who was admitted on the 25th of September for what was  thought to be an intra-abdominal abscess secondary to diverticulosis.  She  underwent exploratory laparotomy shortly after that and has had an extremely  rocky course, complicated by multiple decompensations, including two  episodes of respiratory failure requiring ventilation.  Her second  respiratory arrest was associated with some AV block, and we were asked to  evaluate her for an abnormal electrocardiogram.  She is intubated and really  unable to give a history.  She can respond to questions with yes and no but  denies any chest pain or palpitations currently.   PAST MEDICAL HISTORY:  1.  Sigmoid diverticulosis and diverticulitis complicated by colovaginal      fistula with frank fecal expression.  2.  Hypertension.  2.  Diabetes.  3.  Hyperlipidemia.  4.  A history of peripheral vascular disease, status post a right fem/pop      bypass in 2005.  5.  She has a history of osteoarthritis.  6.  She has had a cholecystectomy, hysterectomy, a carpal tunnel release,      tonsillectomy, and left hemiarthroplasty of her shoulder.   SOCIAL HISTORY:  She lives in [place] with her daughter.   FAMILY HISTORY:  Not obtainable.   REVIEW OF SYSTEMS:  Not obtainable.   MEDICATIONS IN HOSPITAL:  1.  She is on TPN.  2.  She takes Peridex 15 cc twice daily.  3.  Clindamycin 600 mg IV q.6h.  4.  Lotrisone  topical 3 times a day.  5.  Lovenox 60 mg subcu b.i.d.  6.  Lasix 40 mg IV q.12h.  7.  Primaxin 500 mg IV q.12h.  8.  Sliding-scale insulin.  9.  Xopenex inhaler.  10. Magic mouthwash 4 times a day.  11. Protonix 40 mg IV daily.  12. She is on IV normal saline and a dopamine drip.   PHYSICAL EXAMINATION:  VITAL SIGNS:  Pulse 112, temperature 101.4,  respiratory rate 34-40 on the ventilator, blood pressure 130/60.  She is  saturating 98% on 30% FiO2.  GENERAL:  She is an elderly black female who is intubated.  She is not  sedated.  HEENT:  Unremarkable.  NECK:  Supple.  There is no jugular venous distention or carotid bruits  noted.  LUNGS:  Coarse breath sounds throughout with relatively shallow  respirations.  CARDIOVASCULAR:  Tachycardic with no obvious murmur.  SKIN:  Without significant lesions.  BREASTS/GU/RECTAL:  Deferred.  ABDOMEN:  Postoperative without significant bowel sounds.  EXTREMITIES:  Without clubbing, cyanosis or edema.  Pulses are 1+.  NEUROLOGIC:  She is intubated but alert and oriented.   Chest x-ray from the 27th shows severe pulmonary edema with increased  bibasilar atelectasis.   Chest x-ray from today, October 30th, shows the PICC line with no  significant pulmonary edema.   Her renal ultrasound from admission shows a suggestion of left  hydronephrosis.   Electrocardiogram most recently from the 29th shows sinus tachycardia at a  rate of 143 with ST segment depression in the inferior lateral leads, which  is new from her admission H&P.  QRS duration is still 80 milliseconds.   LABORATORY:  Her white blood cell count is 19.2 with an H&H of 8 and 26.  Platelet count 237.  Sodium 145, potassium 4.4, chloride 112, bicarb 23, BUN  55, creatinine 1, and her blood sugar is 90.  Her LFTs are all within normal  limits except for a slightly elevated alkaline phosphatase.  Three sets of  cardiac enzymes show CK's in the 80-90 range with MB's in the 8-11  range, so  the MB's are slightly elevated.  Her troponins are slightly increased and  appear to be increasing with an initial at 0.12, then 0.17, and now 0.2.   So we have a lady who appears to have intra-abdominal sepsis, who has  ventilator-dependent respiratory failure, recurrent AV block in the setting  of respiratory failure, and elevated troponins and abnormal cardiac enzymes,  which are not in a classic pattern for an acute myocardial infarction.  She  has abnormal electrocardiogram, both with AV block and ST segment  depression.  I think this is a lady who is quite ill and has multiple organ  systems which are not doing well currently.  I am concerned enough about her  that I am going to recommend that we seriously consider critical care  evaluation, which may necessitate a transfer to [facility].  I  think we need an echocardiogram.  I think I would check a B-type natriuretic  peptide.  I am not sure that she is having active ischemia, even though she  has an abnormal electrocardiogram.  The echo will help [place] out in this  situation.      Scarlett Presto, M.D.  Electronically Signed    JH/MEDQ  D:  10/02/2005  T:  10/02/2005  Job:  [id number]

## 2011-04-21 NOTE — Procedures (Signed)
NAMEEMMABELLE, MENDITTO             ACCOUNT NO.:  [id number]   MEDICAL RECORD NO.:  [id number]          PATIENT TYPE:  INP   LOCATION:  IC04                          FACILITY:  [facility]   PHYSICIAN:  Edward L. Luan Pulling, M.D.DATE OF BIRTH:  09-27-1926   DATE OF PROCEDURE:  DATE OF DISCHARGE:                                EKG INTERPRETATION   The first one is from 2:01, October 01, 2005. The rhythm is sinus rhythm  with a rate in the 90s. There is ST-T wave abnormalities which could  indicate ischemia. QT interval is prolonged. Abnormal electrocardiogram.   Next is from 2:09, October 01, 2005. The rhythm is a sinus rhythm with a  markedly bradycardic rate and in at least one area 2 to 1 AV block.  Laterally, there is appears to be artifact. There are ST-T wave  abnormalities which could indicate ischemia. Abnormal electrocardiogram with  marked change from EKG of 8 minutes prior.      Edward L. Luan Pulling, M.D.  Electronically Signed     ELH/MEDQ  D:  10/01/2005  T:  10/02/2005  Job:  [id number]

## 2011-04-21 NOTE — Consult Note (Signed)
[name], [name]             ACCOUNT NO.:  [id number]   MEDICAL RECORD NO.:  [id number]          PATIENT TYPE:  INP   LOCATION:  IC09                          FACILITY:  [facility]   PHYSICIAN:  Gaston Islam. Tressie Stalker, MD  DATE OF BIRTH:  01-21-1926   DATE OF CONSULTATION:  09/20/2005  DATE OF DISCHARGE:                                   CONSULTATION   ONCOLOGY CONSULTATION:   PRIMARY CARE PHYSICIAN:  Dr. Irving Shows and Dr. Iona Beard   DIAGNOSES:  1.  Severe thrombocytopenia.  2.  Sepsis secondary to an episode of diverticulitis with what appears to be      low abdominal/pelvic abscess status post surgical resection.  3.  History of cholecystectomy.  4.  History of hysterectomy for fibroids in her 75s.  5.  History of right wrist surgery secondary to carpal tunnel syndrome.  6.  Tonsillectomy in the past.  7.  Lower extremity surgery in the past.  8.  Postoperative right leg infection in February 2005.  9.  Left shoulder surgery with left hemiarthroplasty by Dr. Aline Brochure for      severe osteoarthritis.   This is a 77 year old lady who really cannot talk.  She has a tube in her  mouth for the ventilator who however was admitted on September 25 with an  exacerbation of what appeared to be diverticulitis.  This was diagnosed  initially in August at which time she had significant wall thickening with  pericolic inflammatory changes in the distal sigmoid colon.  She was treated  for 5 days with antibiotics.  CT scan showed fluid collection.  She had this  subsequently drained.  Nothing grew out on culture.  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   She was admitted on the above mentioned date with repeat scan showing  persistent diverticulitis and she was admitted for excision.   She has a history of hypertension as well, type 2 diabetes mellitus and  hyperlipidemia along with peripheral vascular disease.   Family history is positive for a stroke and COPD with a  daughter who has  hypertension and mental retardation according to the records.  She is not  able to answer the questions.  She can do some things as far as following  commands, however.   Her vital signs show that she has not been febrile since at least October 14  when her temperature was just over 100 degrees.  She has not been febrile  since.  Her blood pressure has been in the [id number] range systolically.   Her pulse is around 86-92.  It is fairly regular when I examined her today.   Her breast exam was negative for masses.  Her lungs were clear anteriorly.  She had no evidence for Candida inside the mouth that I could appreciate.  Her pupils responded barely to light.  She had what appeared to be  cataracts.  Her abdomen was soft.  She had a Foley catheter in place.  She  had a few bowel sounds.  She had a midline incision.  She had no ankle edema  at this time.  Pulses were definitely diminished in her feet.   Skin was warm and dry to the touch.   Her labs have been drawn for multiple days since admission.  Her white count  was 14,500 today, hemoglobin was 12.7, platelets however were low at 19,000  though they were perfectly normal on this admission and were normal until  approximately October 11 when they started to fall and they have fallen ever  since.   She has also had a PT and PTT which are both elevated today and yesterday;  her PT was 21.7, PTT was 60 and that is not on any Lovenox or Coumadin.  Her  creatinine has risen to 3.7 and she has been seen by a renal consultant for  this renal insufficiency.  Her BUN was 99 today.  Her total bilirubin was  1.1, SGOT was normal as alkaline phosphatase was normal today as well, SGPT  was normal, total protein was low at 4.1 with albumin of only 1.7.  Her  magnesium was normal at 2.3.   Her chest x-ray done on the 17th showed some atelectasis right greater than  left, heart size upper limits of normal and she had severe  degenerative  disease of the right shoulder noted.  Support tubes and lines were also  noted.   In looking back over this lady's records with normal platelets up until  around October 11, she certainly has developed severe thrombocytopenia since  then.  She also has an elevated PT and PTT which certainly could be from  sepsis though she has been afebrile and actually has looked on the up and up  according to the nurse and the notes and Dr. [name] as well.   I suspect that this may well be drug induced possibly from the [place].  I  have not found that Tygacil which is the other antibiotic she is on could  cause this but they were started within the last 2 weeks.   Therefore, I have recommended that we give her vitamin K to see if she is  vitamin K deficient.  I need to look at her blood smear and right now I  would continue the Tygacil.  I believe she is also on Diflucan which I would  also continue since her white count is adequate as is her hemoglobin.   With the renal insufficiency platelet half life certainly is diminished as  well but she certainly may well have been septic but that seems to be  getting better based upon the notes and her appearance according to the  healthcare providers.   She was certainly able to open her eyes.  She could follow commands by  looking at me.  She could keep her eyes open.  She opened her mouth a little  bit for me, etc.   She overall appeared very, very weak, however.   This may be a multifactorial process namely the thrombocytopenia could be  from the left over sepsis but I am very concerned it may be drug induced and  we will see what the counts do over the next few days.  I will check a [id number]  level and folic acid level.  She is on TPN with some vitamins but I think we  ought to check those levels as well and then I will follow her along.  If her platelets drop to 10,000 or less I do think we need to support her.  If  her platelets  do not  get any better off the Levaquin then I think on Monday  we should stop her Tygacil as well.      Gaston Islam. Tressie Stalker, MD  Electronically Signed     ESN/MEDQ  D:  09/20/2005  T:  09/20/2005  Job:  [id number]

## 2011-04-21 NOTE — Discharge Summary (Signed)
[name], [name]             ACCOUNT NO.:  [id number]   MEDICAL RECORD NO.:  [id number]          PATIENT TYPE:  INP   LOCATION:  A301                          FACILITY:  [facility]   PHYSICIAN:  Barrie Folk. 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  The  patient was admitted for treatment of elevated white count of 16,200 on  December 18, 2005.  She was experiencing a cough, also over the past three  days that was associated with flushing of a Port-A-Cath pertaining to TPN.  History is also positive for some shortness of breath.  She did not complain  of runny nose, nausea, vomiting, sore throat, syncope, dizziness, edema of  the leg and chest pain.   PAST MEDICAL HISTORY:  1.  Colostomy on October 09, 2005, performed by Dr. Zella Richer, at [facility], due to colonic anastomotic leak.  2.  Hypertension.  3.  Type 2 diabetes mellitus.  4.  Osteoarthritis.  5.  Chronic metabolic acidosis.  6.  Diverticulosis.  7.  Peripheral vascular disease and 8. TPN therapy for treatment of      nutrition and compromise secondary to multifactorial causes (physical      debilitation secondary to severe chronic illness and chronic      depression).   PAST SURGICAL HISTORY:  1.  Cholecystectomy.  2.  Hysterectomy by Dr. Marnette Burgess in her 85s secondary to fibroids.  3.  Right wrist surgery secondary to carpal tunnel syndrome.  4.  Tonsillectomy age 11 at [facility].  5.  Neck surgery at [facility].  6.  Postoperative infection of right leg in February 2005, at [facility].  7.  Right femoral-popliteal bypass graft in February 2005 by Dr. Zada Girt.  8.  Right hemiarthroscopy of the  left shoulder by Dr. Arther Abbott.  9.  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 Mohammed Kindle.   ALLERGIES:  No known drug allergies.   HABITS:  Negative for use of alcohol, ethanol, and street drugs.   HOSPITAL COURSE:  #1 - LEUKOCYTOSIS SECONDARY TO ACUTE BACTEREMIA SECONDARY  TO VANCOMYCIN-RESISTANT ENTEROCOCCUS.  General appearance revealed an  elderly, slightly small frame, slightly short black female in no apparent  respiratory distress.  Vitals on admission were as follows:  Temperature  97.9, pulse 112, respirations 32, blood pressure 149/78.  Skin was negative  for lesions.  Lungs demonstrated fair air movement without distinct rales on  auscultation.  Heart revealed audible S1, S2 without murmur.  Rhythm was  regular and rate was 108.  Abdomen was slightly obese and positive for old  healed surgical scar.  Abdomen was positive for right upper quadrant  colostomy.  Abdomen as soft and nontender on palpation in all four  quadrants.  Abdominal exam demonstrated no palpable mass or organomegaly.  Extremities demonstrated no tibial edema.  The patient was neurologically  intact.  Significant labs on admission were as follows:  White count 18,100,  hemoglobin 9.0, hematocrit 26.9, platelets 294,000.  Sodium 136, potassium  3.4, chloride 107, CO2 24, BUN 28, creatinine 1.1, calcium 8.3, total  protein 7.0, albumin 2.4.  Urinalysis demonstrated the following on  admission:  Specific gravity 1.010, pH 5.0, no glucose, no hemoglobin, no  bilirubin, no ketone, no protein, nitrite negative, leukocyte esterase  trace, wbc's 11-20, few bacteria.  The patient as treated with IV  vancomycin, IV doxycycline and IV Diflucan.  Blood cultures were also  ordered.  Blood culture grew vancomycin-resistant enterococcus.  The patient  was treated with antibiotics based on sensitivity.  She was then started on  Zyvox 600 mg IV q.12 h on December 24, 2005.  Her hospital  course was  uneventful.  Her last white count was on December 22, 2005, revealing a value  of 9900.  The patient was alert and oriented to person, place and time at  the time of discharge.  Appetite was good.  She had no complaint of fever,  chills, shortness of breath or general malaise at time of discharge.   #2 - ACUTE PULMONARY EDEMA SECONDARY TO FLUID OVERLOAD.  On admission, the  patient was complaining of frequent coughing spells secondary to flushing of  Port-A-Cath at home and during the early part of this admission.  Chest x-  ray on December 18, 2005, demonstrated a low lung volume with suggestion of  developing mild pulmonary venous congestion.  It also read improvement in  left basilar atelectasis without evidence of superimposed infection.  The  patient was treated with IV furosemide.  She did well in response to this  treatment.  Peak chest x-ray on December 20, 2005, demonstrated overall  improvement in lung volume.  There was improvement in vascular congestion.  Also, was read possible right lower lobe aspect to be pneumonia or  atelectasis.  The peak chest x-ray on December 22, 2005, demonstrated slight  improvement in overall aeration of the lung with that improvement being more  marked on the left than the right.  There remains some right lower lobe  atelectasis and/or infiltrate.  There had been interval improvement in  vascular congestion.  The patient was able to lie flat.  She was ambulatory  in the hall with the use of the walker without complaining of shortness of  breath.  The patient felt significantly better.   #3 - MILD HYPOKALEMIA.  Serum potassium on admission was 3.4.  The patient  was treated with p.o. potassium.  Adjustment had to be made in potassium due  to value of 6.9 on December 24, 2005.  She was treated with Kayexalate  suspension.  Repeat potassium on December 26, 2005 was 5.2.  #4 - PRERENAL AZOTEMIA.  The patient's fluid was adjusted as needed.  BUN  on  admission was 28 and creatinine 1.1.  Repeat BUN on December 25, 2005, was  19, and creatinine was 1.4.   #5 - CHRONIC ANEMIA.  Hemoglobin on admission was 9.0, hematocrit 26.9,  platelets 294,000.  It was felt that this anemia was chronic and due to  complication of chronic illness.  Anemia panel revealed the following on  December 18, 2005:  Serum iron 10 mcg/dL (normal 42-135), total iron-binding  capacity of 196 mcg/dL (normal 250-470), iron saturation 5% (normal 20-55),  B12 329 pcg/mL, folate 11.4 ng/mL, ferritin 285 ng/mL.  A Hematology consult  was done by Dr. Tressie Stalker on December 19, 2005.  His impression was chronic  anemia.  He felt that it would be worth trying her on Aranesp.  Therefore,  the patient was started on this medication in the hospital.  Dr. Tressie Stalker  also indicated that he would see the patient in approximately 12 weeks in  the office.  Last hemoglobin was 8.5, hematocrit 25.6 on December 22, 2005.   #6 - TYPE 2 DIABETES MELLITUS.  Serum glucose on admission was 189.  Glucose  was monitored daily via Accu-Check.  The patient was started on Avandamet  during this hospitalization because of rising glucose via Accu-Chek.  Fasting blood sugar on the morning of discharge was 142.  The patient had no  complaint of polydipsia and polyuria at the time of discharge.  She did not  complain also of diaphoretic spell or confusion.  Speech was clear and  appropriate.  She was oriented x3.   #7 - HYPERTENSION.  Hypertension was addressed with antihypertensive  medications.  Blood pressure on admission was 149/78.  Heart exam revealed  audible S1, S2 without murmur.  Rhythm was regular and rate was 108 on  admission.  Lung fields demonstrated no significant crackles.  Extremities  demonstrated no significant edema.  Chest x-ray revealed heart as being  mildly enlarged.  The patient's blood pressure was controlled during this  hospitalization.  Blood pressure on the morning  of discharge was 133/60,  pulse 88.  The patient had no complaints of palpitation, shortness of breath  or dizziness.   #8 - CHRONIC DEPRESSION.  The patient was treated with Cymbalta 60 mg p.o.  daily.  She did not feel down at the time of discharge.  Affect was smiling.  Speech was clear and appropriate.  Appetite was good.  The patient appeared  to be better overall.   #9 - DIVERTICULOSIS.  The patient required no intervention.   #10 - COLOSTOMY RIGHT UPPER QUADRANT.  Colostomy worked without problems  during this hospitalization.  The patient required no intervention.   #11- OSTEOARTHRITIS.  The patient did not complaint of any joint pain,  swelling, redness or hotness.  She will be advised to take Tylenol 500 mg  p.o. q.6-8 h p.r.n. pain.   #12 - PERIPHERAL VASCULAR DISEASE.  Examination of lower extremities  demonstrated no discoloration or ulcers.  Exam of feet demonstrated palpable dorsalis pedis bilaterally.  Both legs demonstrated old healed surgical  scars.  The patient was asymptomatic.   #13 - CHRONIC METABOLIC ACIDOSIS.  The patient has been treated with a  sodium bicarbonate pertaining to her chronic metabolic acidosis.  Electrolytes on admission revealed the following:  Sodium 136, potassium  3.4, chloride 107, CO2 24.  Last electrolytes on December 25, 2005, revealed  the following:  Sodium 137, chloride 112, CO2 19.  The patient was continued  on sodium bicarbonate.  Electrolytes will be monitored closely as  outpatient.   DISCHARGE INSTRUCTIONS:  1.  Diet:  Carbohydrate-modified, low-salt.  2.  Activity:  No restriction.   DISCHARGE MEDICATIONS:  1.  Megace suspension 40 mg/cc 4 teaspoons everyday for appetite.  2.  Sodium bicarbonate 650 mg one tablet b.i.d.  3.  Cardizem 30 mg one tablet q.6 h.  4.  Cymbalta 60 mg one tablet daily.  5.  Avandamet 2/500  one tablet with breakfast everyday.  6.  Fluconazole 100 mg one tablet daily x 11 days.  7.  Doxycycline  100 mg one tablet b.i.d x 11 days.  8.  Clotrimazole/Betamethasone cream to area around colostomy daily.  9.  Zyvox 600 mg p.o. b.i.d. x11 days.   FOLLOWUP:  Follow up in the office on January 05, 2006.   PLAN:  1.  Accu-Chek every day.  2.  [place] to monitor blood sugar, blood pressure and work on      colostomy.   PRIMARY DIAGNOSIS:  1.  Acute leukocytosis secondary to bacteria bacteremia.  2.  Acute mild pulmonary edema.  3.  Mild hypokalemia.   SECONDARY DIAGNOSES:  1.  Hypertension.  2.  Chronic anemia with iron deficiency.  3.  Chronic depression.  4.  Colostomy, right upper quadrant.  5.  Osteoarthritis.  6.  Diverticulosis.  7.  Chronic metabolic acidosis.  8.  Peripheral vascular disease - asymptomatic.  9.  Hypokalemia.  10. Mild prerenal azotemia.   ADDENDUM:  The patient will also be put on her iron supplement, and she will  follow up with Dr. Tressie Stalker as mentioned as outpatient in approximately 12  weeks pertaining to her anemia on discharge.      Barrie Folk. [name], MD  Electronically Signed     GKH/MEDQ  D:  12/29/2005  T:  12/29/2005  Job:  [id number]

## 2011-04-21 NOTE — Consult Note (Signed)
NAMEDODDIE, DOKE             ACCOUNT NO.:  [id number]   MEDICAL RECORD NO.:  [id number]          PATIENT TYPE:  INP   LOCATION:  A301                          FACILITY:  [facility]   PHYSICIAN:  Gaston Islam. Tressie Stalker, MD  DATE OF BIRTH:  [birth year]   DATE OF CONSULTATION:  12/19/2005  DATE OF DISCHARGE:                                   CONSULTATION   REFERRING PHYSICIAN:  Barrie Folk. [name], M.D.   ADMISSION DIAGNOSES:  1.  Anemia of chronic disease.  2.  Recent elevation of white cells, unclear etiology, without a definite      infection identified thus far.  3.  History of recent colostomy by Dr. Jackolyn Confer for what appears to      be a leakage syndrome from diverticulitis.  4.  Sepsis in October 2006, from diverticulitis and a lower abdominal/pelvic      abscess, status post surgical resection.  5.  Cholecystectomy in the past.  6.  Severe thrombocytopenia during her episode of sepsis in October.  7.  Hysterectomy for fibroids in her 43s.  8.  Right wrist surgery secondary to carpal tunnel syndrome.  9.  Tonsillectomy in the past.  10. Lower extremity surgery in the past.  11. Postoperative right leg infection in February 2005.  12. Left shoulder hemiarthroscopy by Dr. Aline Brochure for severe osteoarthritis.   REASON FOR CONSULTATION:  I was asked to see [name] again because of her  anemia which has gotten slightly worse compared to October when her  hemoglobin was around 9 g.  It is now 8.7-9.0 g and her workup has already  been essentially completed with anemia of chronic disease picture and the  question was can Aranesp help this lady.   She is at home on TPN and has been doing quite well with it.  She states her  energy level is better than it was.  She has gained weight.  She certainly  looks better than I remember her at this time.   She has presently no nausea, vomiting, no diarrhea, bowels working well  through her colostomy.  She is not aware of night sweats.   PHYSICAL EXAMINATION:  VITAL SIGNS:  She is afebrile presently, respirations  16-18 and unlabored.  GENERAL:  She is lying quietly in bed.  SKIN:  Warm and dry to the touch.  HEART:  Regular rate and rhythm.  There is a faint 1/6 systolic ejection  murmur, but no S3 gallop.  LYMPHS:  She has no adenopathy in the cervical, supraclavicular, follicular,  axillary or inguinal areas.  ABDOMEN:  She has no obvious hepatosplenomegaly.  She has changes in her  inguinal areas of old chronic Candida infection.  Bowel sounds are normal.  Colostomy is in the right mid portion of the abdomen.  EXTREMITIES:  She has no leg edema.  LUNGS:  Clear anteriorly.   RECOMMENDATIONS:  I think it is well worth trying her on Aranesp.  It may  certainly help with her anemia of chronic disease.  If home health through  [facility] would do it at home, that would  be great, but if not we  can arrange it here in the clinic.  I will see her back in 12 weeks.  I  think we need to give it a 12-week trial at 100 mcg every other week going  forward.      Gaston Islam. Tressie Stalker, MD  Electronically Signed     ESN/MEDQ  D:  12/19/2005  T:  12/19/2005  Job:  [id number]

## 2011-04-21 NOTE — Discharge Summary (Signed)
NAME:  [name], [name]                       ACCOUNT NO.:  [id number]   MEDICAL RECORD NO.:  [id number]                   PATIENT TYPE:  INP   LOCATION:  3316                                 FACILITY:  [facility]   PHYSICIAN:  Epifania Gore. Nils Pyle, M.D.             DATE OF BIRTH:  1926/03/24   DATE OF ADMISSION:  01/21/2004  DATE OF DISCHARGE:  01/25/2004                                 DISCHARGE SUMMARY   ADMITTING DIAGNOSIS:  SFA occlusion on the right with ischemic rest pain.   OPERATION:  Right femoral popliteal bypass.   HISTORY OF PRESENT ILLNESS:  The history of present illness is well  documented in the dictated H&P.  Please refer their.   HOSPITAL COURSE:  The patient was admitted to the hospital following her  operation. She remained stable with the exception of a sinus arrhythmia  which was investigated by Dr. Melvern Banker and associates. This was found to be a  non-life threatening situation.  A discernment of hyperlipidemia along with  a hyperglycemia was documented and the patient was started on Lipitor 20 mg  every day and Metformin 500 mg b.i.d.  At the time of this dictation, her  incisions are well healed.  The graft is patent by doppler. The incisions  appear to be benign in appearance.  We feel that she has received maximum  benefit from this hospitalization and the remainder of her convalescence can  be achieved at home.  Towards these ends, we are discharging her.  We will  see her in the office in one week.  Her staples will remain intact.  If she  has difficulties with fever, increase in pain, swelling or any concern, she  is to call my office.   ADDENDUM:  Her discharge medications will include the resumption of her  preoperative medications.  In addition, the Metformin 500 mg b.i.d., Lipitor  20 mg every day. For pain she will receive Tylox one to two every 6 to 8  hours prn for pain.                                                Harold A. Nils Pyle, M.D.    Rondel Oh  D:  01/25/2004  T:  01/25/2004  Job:  [id number]   cc:   Barrie Folk. [name], M.D.  [address]  [place]  [place] [address]  Fax: [phone]

## 2011-04-21 NOTE — H&P (Signed)
[name], [name]             ACCOUNT NO.:  [id number]   MEDICAL RECORD NO.:  [id number]          PATIENT TYPE:  OBV   LOCATION:  A327                          FACILITY:  [facility]   PHYSICIAN:  Barrie Folk. 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   CHIEF COMPLAINT:  Lower abdominal pain.   HISTORY OF PRESENT ILLNESS:  The patient has been experiencing lower  abdominal pain x5 days.  She described the pain as a stabbing sensation.  Patient indicated that it feels as if someone is constantly sticking knives  in her lower abdomen.  Pain has gotten worse over the past 5 days.  History  is negative for nausea, vomiting, melena, diarrhea, fever, chills, dysuria,  gross hematuria, etc.  History is positive for lack of bowel movements x4  days.  Medical history is positive for hypertension, osteoarthritis, type 2  diabetes mellitus, hyperlipidemia, peripheral vascular disease and  diverticulosis.  History is negative for hyperactive bowel sounds and  abdominal distention.  The patient has taken prune juice and milk of  magnesia without a bowel movement.  Medical history is negative for  tuberculosis, cancer, sickle cell, asthma, seizure disorder.   The patient is not allergic to any known medication.   Prescribed meds on admission are Avinza 30 mg p.o. once daily, Prevacid 15  mg p.o. once daily, Starlix 120 mg p.o. every morning before breakfast,  Celebrex 200 mg p.o. once daily and Xanax 0.25 mg p.o. every day and  verapamil 240 mg p.o. every day.   Habits are negative for ethanol, tobacco, and street drugs.   Past medical history is positive for hospitalization for cholecystectomy by  Dr. Mohammed Kindle at [facility]; hysterectomy by Dr.  Marnette Burgess in her  21s secondary to fibroids; pregnancy; right wrist surgery secondary to  carpal tunnel syndrome; tonsillectomy at age 79 [facility]; leg  surgery at [facility] 5 years ago;  postoperative infection of  right leg in February 2005; surgery on left shoulder (left hemiarthroplasty)  by Dr. Kenton Kingfisher due to severe osteoarthritis.   FAMILY HISTORY:  Family history revealed mother deceased age 14 secondary to  complication of stroke; father deceased 22 secondary to heart attack; one  sister living age 22 with history of emphysema; one daughter living age 53  with history of mental retardation and hypertension; two sisters deceased  age 56 secondary to complications at childbirth and one in her 61s secondary  to cerebral aneurysm.   REVIEW OF SYSTEMS:  Review of systems is positive for multiple joint pains  and stiffness, episodic constipation, episodic insomnia.  Review of systems  negative for chronic headache, epistaxis, bleeding gums, dysphagia,  shortness of breath, hemoptysis, chest pain, syncope, dizziness,  hematemesis, dysuria, melena, weight loss, wheezing, lumps in breasts,  discharge from breasts, etc.  PHYSICAL EXAMINATION:  VITALS IN THE OFFICE:  Blood pressure 130/66,  respirations 20, pulse 84.  GENERAL APPEARANCE:  Elderly slightly short medium frame black female in no  apparent respiratory distress.  Normocephalic.  EARS:  Normal auricles.  EYES:  Lids negative for ptosis.  Sclerae white.  Pupils round, equal, and  reactive light.  Extraocular movements intact.  NOSE:  Negative for discharge.  MOUTH:  Positive missing teeth.  Remaining dentition fair.  No bleeding  gums.  Posterior pharynx benign.  NECK:  Negative for lymphadenopathy or thyromegaly.  LUNGS:  Clear.  HEART:  Audible S1 and S2 without murmur.  Regular rate and rhythm.  BREASTS:  No skin changes.  Nipples erect without discharge.  ABDOMEN:  Obese.  Hypoactive bowel sounds.   Positive old healed mid-  hypogastric surgical scar.  Positive for healed old right lower quadrant  surgical scar.  Soft.  Positive for diffuse hypogastric tenderness.  No  palpable masses, no organomegaly, no rebound tenderness.  PELVIC:  Deferred.  RECTAL:  No external lesions.  Digit exam positive for stool in rectal  vault, soft stool, guaiac negative, significant amount of stool in rectal  vault.  EXTREMITIES:  Right thigh positive for old healed surgical scar.  Tibia no  edema.  Feet palpable dorsalis pedis bilaterally.  No joint swelling.  No  joint redness.  No joint hotness.  Right shoulder positive for stiffness and  tenderness with abduction greater than 90 degrees, left shoulder positive  for stiffness and tenderness with abduction greater than 90 degrees.  NEUROLOGIC:  Alert and oriented to person, place, and time.  Cranial nerves  II-XII appeared intact.   IMPRESSION:  Acute abdominal pain with constipation.   SECONDARY DIAGNOSES:  1.  Hypertension.  2.  Peripheral vascular disease.  3.  Hyperlipidemia.  4.  Osteoarthritis.  5.  Type 2 diabetes mellitus.   PLAN:  1.  IV normal saline at 60 mL/hr.  2.  Flat plate upright film of abdomen.  3.  Labs:  CBC, MET-7, liver function tests, urinalysis, serum amylase,      serum lipase, erythrocyte sedimentation rate.  4.  Medications:      1.  Toradol 30 mg IV q.6h. x4.      2.  Milk of magnesia plus mineral oil 30 mL each p.o. every bedtime.      3.  Milk and molasses 240 mg p.o. every morning.      4.  Protonix 40 mg IV once daily.  5.  Accu-Chek before every meal and at bedtime.  6.  Hemoglobin A1c.       GKH/MEDQ  D:  06/30/2005  T:  06/30/2005  Job:  [id number]

## 2011-04-21 NOTE — Op Note (Signed)
NAME:  [name], [name]                       ACCOUNT NO.:  [id number]   MEDICAL RECORD NO.:  [id number]                   PATIENT TYPE:  INP   LOCATION:  3316                                 FACILITY:  [facility]   PHYSICIAN:  Epifania Gore. Nils Pyle, M.D.             DATE OF BIRTH:  [birth year]   DATE OF PROCEDURE:  01/21/2004  DATE OF DISCHARGE:                                 OPERATIVE REPORT   PREOPERATIVE DIAGNOSIS:  Occlusion of the right superficial femoral artery  with ischemic rest pain.   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   OPERATION PERFORMED:  Right femoral popliteal bypass.   SURGEON:  Epifania Gore. Nils Pyle, M.D.   ANESTHESIA:  General.   INDICATIONS FOR PROCEDURE:  Ms. Otanez is a 77 year old lady who has had  progressive claudication which has progressed to rest pain.  Preoperative  arteriography demonstrated complete occlusion of the SFA with two vessel  runoff.  We have recommended that she undergo a right femoral-popliteal  bypass.   DESCRIPTION OF PROCEDURE:  The patient was taken to the operating room and  placed on the supine position.  Anesthesia was achieved with a general  endotracheal technique.  The right extremity was prepped with Betadine soap,  painted with Betadine solution and sterile drapes were applied.  Popliteal  artery was approached from the above the knee perspective and medially.  An  incision was made, taken down through the skin layers with hemorrhage  controlled with cautery.  The popliteal artery was identified and was traced  to the behind the knee projection.  An area corresponding to the arteriogram  was defined.  It appeared to be supple.  The wound was packed with  antibiotic soaked sponge and attention was turned to the groin.  Longitudinal incision was made over the femoral pulsation and taken down  through the skin layers with hemorrhage controlled with cautery.  The  common  femoral, profunda femoris and superficial femoral arteries were identified  and controlled with vessel loops.  Subsartorial tunnel was created through  which a 6 mm Gore-Tex graft was led.  The patient was systemically  heparinized.  Proximal and distal control were obtained on the popliteal  artery, a longitudinal arteriotomy was performed.  A local atherectomy was  performed to facilitate the distal anastomosis.  Thereafter heparin was  instilled into the outflow.  A #3 Fogarty passed freely to the level of the  foot.  The anastomosis was achieved using a continuous suture technique with  6-0 Prolene.  This leg was extended and the graft was tapered proximally.  Proximal anastomosis was achieved at the confluence of the profunda femoris,  the superficial femoral and the common femoral artery.  The anastomosis was  achieved with a continuous suture technique with 6-0 Prolene.  Flow was re-  established to the native circulation and finally to the graft itself.  There was a strong polyphasic signal in the anterior tibia.  The heparin was  reversed with protamine.  Incisions were closed in layers.  The patient  tolerated the procedure well, was transferred from the operating room to the  recovery room in satisfactory condition with stable vital signs.                                               Harold A. Nils Pyle, M.D.    Rondel Oh  D:  01/21/2004  T:  01/21/2004  Job:  [id number]

## 2011-04-21 NOTE — Discharge Summary (Signed)
[name], [name]             ACCOUNT NO.:  [id number]   MEDICAL RECORD NO.:  [id number]          PATIENT TYPE:  INP   LOCATION:  6713                         FACILITY:  [facility]   PHYSICIAN:  [name], M.D.      DATE OF BIRTH:  Mar 06, 1926   DATE OF ADMISSION:  10/05/2005  DATE OF DISCHARGE:                           DISCHARGE SUMMARY - REFERRING   DISCHARGE DIAGNOSES:  1.  Status post ventilator dependent respiratory failure with intubation x2      with last extubation October 05, 2005.  2.  Chronic diverticulitis, status post laparotomy at [facility], August 29, 2005, with colovaginal      fistula with a sigmoid colectomy on August 29, 2005, with anastomotic      leak with transfer to [facility] on October 05, 2005, for a transverse colostomy performed on October 09, 2005, by Dr.      Zella Norris.  3.  Diabetes mellitus.  4.  Debility.   HISTORY OF PRESENT ILLNESS:  [name] is a 77 year old African American  female who has been ventilator dependent respiratory failure x2, laparotomy  for a colovaginal fistula with leaking anastomosis.  She was admitted to  [facility] on August 28, 2005, for abdominal pain with history  of diverticulitis.  Abdominal CT revealed abdominal fluid collection.  She  underwent laparotomy on August 29, 2005, with sigmoid colectomy and  anastomosis per Dr. Irving Norris.  She required intubation x2 and mechanical  ventilatory support to be adequately ventilated.  She was extubated prior to  her transfer to [facility] on October 05, 2005, and  presented on four liters nasal cannula with O2 saturations 100%.  She  continued at that point to have fecal discharge from her vagina.  [name] drains x2 that had been in for approximately one month were continuing  to drain feces.  He was transferred to [facility]  from  [facility] for further evaluation and treatment and questionable  surgical intervention.   PAST MEDICAL HISTORY:  1.  Sigmoid diverticulitis.  2.  Hypertension.  3.  Diabetes mellitus.  4.  Decreased lipids.  5.  Peripheral vascular disease with a right femoral-popliteal bypass graft      in February 2005.  6.  Osteoarthritis.  7.  Status post hysterectomy.  8.  Carpal tunnel syndrome.  9.  Right hemiarthroscopy of the left shoulder.   ALLERGIES:  NO KNOWN DRUG ALLERGIES.   SOCIAL HISTORY:  She lives with her daughter.   FAMILY HISTORY:  Noncontributory.   LABORATORY DATA:  Ammonia level 14.  Sodium 143, potassium 4.7, chloride  120, CO2 19, glucose 90, BUN 28, creatinine 1.1, calcium 8.5.  WBC 11.4,  hemoglobin 8.5, hematocrit 25.0, platelets 245.  Arterial blood gas on  October 07, 2005, pH 7.42, pCO2 30, pO2 76 on four liter nasal cannula.  INR 1.2, PTT 22.  Note that her BUN reached a peak of 61 and creatinine  reached  a peak of 1.4.  Phosphorus 4.7.  Magnesium 2.7.  Triglycerides 201.  Urine sodium 76, urine potassium 22.  Blood culture showed no growth. Urine  culture showed yeast.   RADIOGRAPHIC DATA:  Chest x-ray Norris endotracheal tube removed, worsening  bibasilar atelectasis or air space disease, worsening diffuse edema.  Abdominal CT without contrast performed on October 06, 2005, Norris new area  of abnormal soft tissue density along the left anterior chest wall which I  suspect may represent a hematoma based on his relatively high density.  Small new abscess of the anterior aspect of the abdomen.  Resolution of  bowel distention.   A 12-lead EKG showed unusual P-wave axis with possible ectopic atrial rhythm  converting to normal sinus rhythm.   PROCEDURE:  Diverting colostomy performed on October 09, 2005, by Dr. Odis Norris.   HOSPITAL COURSE BY DISCHARGE DIAGNOSIS:  DISCHARGE DIAGNOSIS #1 - VENTILATOR  DEPENDENT RESPIRATORY  FAILURE X2:  She had been extubated prior to transfer  to [facility] and no longer requires mitral valve  regurgitation.   DISCHARGE DIAGNOSIS #2 - CHRONIC DIVERTICULITIS:  Status post laparotomy on  August 29, 2005, with a transverse loop colostomy and diverting colostomy  performed on October 09, 2005, per Dr. Zella Norris.  Please see his  extensive note.  She has two Jackson-Pratt drains that remain in lace along  with colostomy that is remarkable for having a leak.  This was evaluated by  the wound ostomy care consult and is being evaluated on an ongoing basis.  Surgery continues to follow along and help with management of her care.   DISCHARGE DIAGNOSIS #3 - DIABETES MELLITUS:  This is being treated with  appropriate interventions.   DISCHARGE DIAGNOSIS #4 - DEBILITY:  She has been in the subacute care unit.  Transfer is being sought to give her short term rehab in a subacute care  setting due to the fact she still has a colostomy and two Jackson-Pratt  drains.   DISCHARGE MEDICATIONS:  1.  Insulin on a.c. and h.s. then __________ hypoglycemic protocol, 60 to      100 - 0 units, 101 to 150 - 2 units, 151 to 200 - 3 units, 201 to 250 -      6 units, 251 to 300 - 9 units,  301 to 350 - 12 units, greater than 350      - 15 units of Humulin R and call M.D.  2.  Protonix 40 mg p.o. daily.  3.  Lovenox 40 mg q.24h. subcu.  4.  Lasix 20 mg daily p.o.  5.  Currently is on TNA at 75 mL an hour which will most likely be      discontinued as her p.o. intake increases.  6.  Ensure 237 mL daily.  7.  Tylenol per rectum 650 mg q.6h.  8.  Zofran 4 mg IV q.4h. p.r.n.  9.  Albuterol treatment q.4h. p.r.n. at 2.5 mg.   PROCEDURE:  Note that she had a right brachial PICC line in place, placed on  October 02, 2005.  She has Jackson-Pratt drains x2 in the abdomen.  She had  diverting colostomy in place that was placed on October 09, 2005.   DIET:  Advance as  tolerated.   DISPOSITION/CONDITION ON DISCHARGE:  Her rectovaginal fistula has been  repaired with a diverting colostomy.  Her ventilator dependent respiratory  failure has resolved.  Diabetes mellitus is being adequate treated.  She is  being transferred to subacute care unit or nursing home placement for  further strengthening.      Richardson Landry Minor, A.C.N.P. [facility]      [name], M.D.  Electronically Signed    SM/MEDQ  D:  10/16/2005  T:  10/16/2005  Job:  [id number]   cc:   [name], M.D.  [id number], [place] [address]   Leroy C. [name], M.D.  Fax: [phone]   Scarlett Presto, M.D.  Fax: [place] [name], MD  Fax: [phone]

## 2011-04-21 NOTE — Discharge Summary (Signed)
[name], [name]             ACCOUNT NO.:  [id number]   MEDICAL RECORD NO.:  [id number]          PATIENT TYPE:  INP   LOCATION:  A318                          FACILITY:  [facility]   PHYSICIAN:  Vernon Prey. [name], M.D.   DATE OF BIRTH:  11-Oct-1926   DATE OF ADMISSION:  11/20/2005  DATE OF DISCHARGE:  01/03/2007LH                                 DISCHARGE SUMMARY   DISCHARGE DIAGNOSES:  1.  Dehydration with renal failure.  2.  Recurrent abdominal pain with intractable nausea and vomiting.  3.  Severe protein calorie malnutrition.  4.  Severe debility with deconditioning.  5.  Hypertension.  6.  Diabetes mellitus  7.  Osteoarthritis.  8.  Chronic severe depression.  9.  Chronic colovaginal fistula  10. Metabolic acidosis.  11. Thrombocytopenia.   SPECIAL PROCEDURE:  TPN for nutritional support. She also had nephrology  evaluation.   SUMMARY:  A 77 year old female with a chronic course related to  diverticulitis with phlegmon which started back on early August 2006. She  was hospitalized September 25 through October 05, 2005 for diverticulitis  with phlegmon. She underwent sigmoid colectomy with coloproctostomy. She had  a colovaginal fistula. She also had ventilator dependent respiratory  failure, acute right lower lobe pneumonia, severe thrombocytopenia, anemia,  acute renal failure and congestive heart failure. She recovered and was  transferred to [facility] where she had a loop colostomy as a  diversion for the colovaginal fistula. She was discharged home and then  readmitted to [facility] for continued supportive care. She was  discharged home again. She presents now with progressive debility, inability  to eat and drink, progressive dehydration. She was receiving TPN at home,  but she was not taking in enough free water to prevent her from becoming  dehydrated. Her admission labs revealed a pH 7.254, pCO2 of 21, pO2 of 97 on  room air. White count 11,800,  hemoglobin 12.2, hematocrit 37. BUN 58,  creatinine 1.27. Sed rate was 32. Potassium 5.2. Albumin initially was 2.7  but after hydration was 1.7 with total protein 4.9. The patient was  continued on TPN, and she was given IV fluids. Her nausea and vomiting were  controlled with Zofran and Phenergan. Her hyperglycemia from the TPN was  controlled with supplemental insulin. The patient had a protracted hospital  course but gradually improved. Her BUN and creatinine returned towards  normal. She had hypokalemia which improved. By December 24, she was slightly  better. She had gained 10 pounds by this time but part of this was due to  fluid resuscitation. She received evaluation by physical therapy, and she  received physical therapy while she was hospitalized. We tried to convince  the patient that she needed to go to a skilled nursing facility. Initially,  she stated that she would, but she later changed her mind. We continued to  treat her. She has significant depression, and this was also addressed  during this hospitalization. Depression finally was noted to be seen  significantly better around January 2. She was stronger at that time.  Appetite was better. She  was tolerating a diet. By January 3, she was  stable. She was mentally alert. It was felt that she could be discharged  home and followed up closely as an outpatient.   DISCHARGE MEDICATIONS:  Her discharge medications were:  1.  Diltiazem 30 mg every 6 hours.  2.  Remeron 30 mg at bedtime.  3.  Cymbalta 60 mg daily.  4.  Sodium bicarbonate 650 mg twice daily.  5.  Phenergan 25 mg every 4 hours as needed and Phenergan suppositories      p.r.n.  6.  Oxycodone 5/500 b.i.d. as needed pain.  7.  Megace 4 teaspoons once a day.   She will have sliding scale insulin coverage, and home health will continue  management of her TPN, and she will have progressive physical therapy and  stomal care.      Vernon Prey. [name], M.D.   Electronically Signed     LCS/MEDQ  D:  01/23/2006  T:  01/24/2006  Job:  [id number]

## 2011-04-21 NOTE — Discharge Summary (Signed)
[name], STRICKLIN             ACCOUNT NO.:  [id number]   MEDICAL RECORD NO.:  [id number]          PATIENT TYPE:  INP   LOCATION:  A325                          FACILITY:  [facility]   PHYSICIAN:  Vernon Prey. [name], M.D.   DATE OF BIRTH:  [birth year]   DATE OF ADMISSION:  11/01/2005  DATE OF DISCHARGE:  12/09/2006LH                                 DISCHARGE SUMMARY   DISCHARGE DIAGNOSIS:  1.  Recurrent nausea and vomiting.  2.  Severe protein and calorie malnutrition.  3.  Diabetes mellitus.  4.  Chronic anemia.  5.  Rectovaginal fistula.  6.  Osteoarthritis.  7.  Chronic anxiety with depression.  8.  Supraventricular tachycardia.  9.  Hypokalemia.   SPECIAL PROCEDURE:  Port-A-Cath insertion for long-term total nutritional  supplementation.   DISPOSITION:  The patient is discharged home in stable, satisfactory  condition.   DISCHARGE MEDICATIONS:  Doxycycline 100 mg b.i.d., diltiazem 30 mg every 6  hours, Lexapro 10 mg daily, __________ 100 mg 1 every 4 hours as needed for  pain, Xanax 0.25 mg twice daily.  She will have home health nurse to monitor  her TPN therapy and follow up her liver panel and electrolytes.  They will  also do local wound care once daily and assist the family with colostomy  maintenance.   SUMMARY:  The patient is a 77 year old female with severe diverticulitis  status post anterior resection with a stormy postoperative course in  September and October 2006.  She had a small anastomotic leak which was  contained but underwent a diverting colostomy on 6 November at [facility].  She has had continued anorexia, nausea and vomiting and is  readmitted for control of nausea and vomiting and for supplemental IV  feeding.  It is also noted that her transverse colostomy has retracted.   PAST MEDICAL HISTORY:  She has diabetes mellitus, osteoarthritis, chronic  anemia, chronic anxiety, depression with anxiety, hypertension and  peripheral vascular  disease.   PHYSICAL EXAMINATION:  VITAL SIGNS:  Blood pressure 140/60, pulse 105,  respirations 30, temperature 98 degrees.  HEENT:  Unremarkable.  NECK:  Supple.  No JVD, bruit, adenopathy or thyromegaly.  CHEST:  Clear to auscultation.  HEART:  Regular rate and rhythm with __________.  ABDOMEN:  Soft.  There is a functioning retracted stoma in the right upper  quadrant with necrotic peristomal margins.  EXTREMITIES:  Unremarkable.  No edema.  NEUROLOGIC:  No focal deficit.   The patient was admitted.  She was started on IV Zosyn, given IV fluids and  given p.r.n. liquids, and diet was advanced as tolerated.  We started  physical therapy.  A central venous catheter was placed on 30 November with  TPN, and then TPN was started.  She was severely hypoalbuminemic with an  albumin of 2.  Her prealbumin was also very low.  She had initial  tachycardia, which was treated with p.o. low-dose diltiazem.  She did not  have major leukocytosis.  Chest x-ray was done, and it only showed baseline  atelectasis.  The patient improved gradually on treatment.  Plans were then  made for Port-A-Cath insertion.  Port-A-Cath was inserted on 5 December  without difficulty.  She did have an element of prerenal azotemia, which  improved with increased IV fluids in addition to her TPN.  Because of  persistent anemia, she was transfused 2 units of packed red cells on the 6th  of December.  Post-transfusion hemoglobin was 11.9.  Arrangements were made  for the patient to be discharged home and for home health care.  She  appeared much stronger by the 8th.  She did not have any nausea or vomiting.  She was eating much better but not enough to have sustained herself.  Vital  signs were stable.  Blood pressure was controlled, and on the day of  discharge, her pulse was 78.  She was much improved from the time of  admission.      Vernon Prey. [name], M.D.  Electronically Signed     LCS/MEDQ  D:  01/02/2006  T:   01/03/2006  Job:  [id number]

## 2011-04-21 NOTE — Procedures (Signed)
[name], [name]             ACCOUNT NO.:  [id number]   MEDICAL RECORD NO.:  [id number]          PATIENT TYPE:  INP   LOCATION:  IC04                          FACILITY:  [facility]   PHYSICIAN:  Jacqulyn Ducking, M.D. [facility] OF BIRTH:  1925/12/10   DATE OF PROCEDURE:  10/03/2005  DATE OF DISCHARGE:                                  ECHOCARDIOGRAM   REFERRING PHYSICIAN:  Leane Para C. [name], M.D. and Scarlett Presto, M.D.   CLINICAL DATA:  A 78 year old woman with hypertension, diabetes and severe  bradycardia.   M-MODE TRACINGS:  Aorta 2.8, left atrium 2.9, septum 1.4, posterior wall  1.4, LV diastole 3.9, LV systole 2.5.   FINDINGS:  1.  Technically suboptimal, but adequate echocardiographic study.  2.  Normal left atrium, right atrium and right ventricle.  3.  Minimal calcification of the proximal ascending aorta; normal trileaflet      aortic valve.  4.  Normal mitral valve; mild to moderate annular calcification.  5.  Tricuspid valve not well seen, probably normal; physiologic      regurgitation; normal estimated RV systolic pressure.  6.  Normal left ventricular size; borderline hypertrophy; normal function.  7.  Normal IVC.      Jacqulyn Ducking, M.D. [facility]  Electronically Signed     RR/MEDQ  D:  10/03/2005  T:  10/03/2005  Job:  [id number]

## 2011-04-21 NOTE — Consult Note (Signed)
NAME:  [name], [name]                       ACCOUNT NO.:  [id number]   MEDICAL RECORD NO.:  [id number]                   PATIENT TYPE:  INP   LOCATION:  A326                                 FACILITY:  [facility]   PHYSICIAN:  Barrie Folk. [name], M.D.                DATE OF BIRTH:  [birth year]   DATE OF CONSULTATION:  01/30/2004  DATE OF DISCHARGE:                                   CONSULTATION   FAMILY PRACTICE CONSULT   IDENTIFYING DATA:  The patient is a 77 year old, married, gravida 3, para 1,  AB 2, black female from [place], [place].   The patient was admitted by Dr. Zada Girt on January 29, 2004 for  treatment of postoperative right groin serosanguineous drainage x3 days.  The patient underwent a right femoropopliteal bypass surgery on January 21, 2004 at [facility] due to severe peripheral vascular disease.  History is negative for fever, chills and diaphoretic spells.  The wound was  opened in the emergency room by Dr. Zada Girt and packed with iodoform  and gauze.   MEDICAL HISTORY:  Medical history is positive for hypertension,  osteoarthritis, type 2 diabetes mellitus, and hyperlipidemia. Medical  history is negative for tuberculosis, cancer, sickle cell, asthma, or  seizure disorder.   ALLERGIES:  The patient is not allergic to any known medications.   PRESCRIBED MEDICATIONS:  The patient does not know them.   HABITS:  Habits are negative for tobacco, ethanol, or street drugs.   PAST MEDICAL HISTORY:  1. Past medical history is positive for hospitalizations for cholecystectomy     by Dr. Mohammed Kindle at [facility].  2. Hysterectomy, by Dr. Marnette Burgess, in her 59s secondary to fibroids.  3. Pregnancy.  4. Right wrist surgery secondary to carpal tunnel syndrome.  5. Tonsillectomy at age 59 at [facility].  6. Left leg vascular surgery at [facility] approximately 5 years     ago.   FAMILY HISTORY:  1. Mother deceased,  age 57, secondary to complications of stroke.  2. Father deceased, age __________, secondary to heart attack.  3. One sister, living at age 63, history of emphysema.  60. One daughter, living at age 81, history of mental retardation and     hypertension.  42. Two sisters deceased age 38 secondary to complications of childbirth and     one in her 33s secondary to cerebral aneurysm.   REVIEW OF SYSTEMS:  Positive for chronic bilateral shoulder pain and  stiffness and chronic right leg pain before surgery.  Review of systems is  negative for epistaxis, bleeding gums, hemoptysis, shortness of breath,  chronic cough, chest pain, dysuria, gross hematuria, melena, weight loss,  dysphagia, wheezing, etc.   PHYSICAL EXAMINATION:  VITAL SIGNS:  Temperature 98.1, pulse 82,  respirations 20, blood pressure 123/95.  GENERAL APPEARANCE:  Reveals an elderly, slightly short, medium-framed,  black female in no  apparent respiratory distress.  HEAD:  Normocephalic.  EARS:  Normal auricles.  EYES:  Lids negative for ptosis.  Sclerae white.  Pupils round, equal, and  reacted to  light.  NOSE:  Negative for discharge.  MOUTH:  Positive for missing teeth, remaining dentition fair.  No bleeding  gum.  Posterior pharynx benign.  NECK:  Negative for lymphadenopathy or thyromegaly.  LUNGS:  Clear.  HEART:  Audible S1, S2 without murmur.  Regular rate and rhythm.  BREASTS:  No skin changes.  No nodules on palpation.  Nipples erect without  discharge.  ABDOMEN:  Obese, hyperactive bowel sounds.  Positive healed, old  midhypogastic surgical scar.  Soft, nontender in all 4 quadrants.  No  palpable mass, no organomegaly.  Right groin open wound with packing.  PELVIC:  Deferred.  EXTREMITIES:  Right thigh distal dorsal aspect positive for wound with  intact stable and no active drainage or surrounding redness.  Tibia no edema  no discoloration.  Feet palpable dorsalis pedis bilaterally.  NEUROLOGIC:  Alert and  oriented to person, place and time.  Cranial nerves  II-XII appeared intact.   LABS:  White count 9.7, hemoglobin 10.4, hematocrit 31.7, platelets 389,00.   PRIMARY IMPRESSION:  Postoperative wound drainage indicating possible early  infection.   SECONDARY DIAGNOSES:  1. Hypertension.  2. Hyperlipidemia.  3. Osteoarthritis.   PLAN:  1. IV antibiotics.  2. IV fluid.  3. Wound care.  4. Keep patient off feet as much as possible.  5. Bedside commode.  6. Diet:  Carbohydrate modified, low salt.  7. Fasting lipid profile, MET-7, hemoglobin A1c.  8. Dietician consult.  9. Accu-Check 0700 and 1600 daily.  10.      Colace 200 mg p.o. at bedtime.  11.      Lipitor 20 mg p.o. at bedtime.      ___________________________________________                                            Barrie Folk. [name], M.D.   Armanda Heritage  D:  01/30/2004  T:  01/30/2004  Job:  [id number]

## 2011-04-21 NOTE — Discharge Summary (Signed)
NAME:  [name], [name]                       ACCOUNT NO.:  [id number]   MEDICAL RECORD NO.:  [id number]                   PATIENT TYPE:  INP   LOCATION:  A326                                 FACILITY:  [facility]   PHYSICIAN:  Epifania Gore. Nils Pyle, M.D.             DATE OF BIRTH:  1926-10-07   DATE OF ADMISSION:  01/29/2004  DATE OF DISCHARGE:  02/05/2004                                 DISCHARGE SUMMARY   ADMISSION DIAGNOSIS:  Wound infection, right groin.   HISTORY OF PRESENT ILLNESS:  [name] had undergone a right femoral  popliteal bypass for incapacitating claudication.  Her initial procedure was  without incident.  She was seen in postop complaining of drainage from her  groin.  A superficial wound infection was considered, but there was always  the threat of deeper infection in the face of a prosthetic graph.  The  patient was subsequently admitted to the hospital.   PAST MEDICAL HISTORY/REVIEW OF SYSTEMS:  Please refer to the most recent  admission.   HOSPITAL COURSE:  The patient was admitted to the floor and underwent an  open wound management with initial irrigations with saline with broad  spectrum antibiotics.  At the time of this dictation, the wound was clean  and dry.  We feel that the remainder of convalescence can be achieved at  home with the home health nurse visiting and continuing her dressings.  Towards these ends, we are discharging [name].  We will see her in the  office on a weekly basis.     ___________________________________________                                         Epifania Gore Nils Pyle, M.D.   Rondel Oh  D:  03/01/2004  T:  03/01/2004  Job:  [id number]

## 2011-04-21 NOTE — H&P (Signed)
[name], Norris             ACCOUNT NO.:  [id number]   MEDICAL RECORD NO.:  [id number]          PATIENT TYPE:  INP   LOCATION:  A330                          FACILITY:  [facility]   PHYSICIAN:  Vernon Prey. [name], M.D.   DATE OF BIRTH:  1926/07/16   DATE OF ADMISSION:  08/28/2005  DATE OF DISCHARGE:  LH                                HISTORY & PHYSICAL   HISTORY OF PRESENT ILLNESS:  A 77 year old female with known history of  diverticulitis, status post hospitalization August 22 through August 03, 2005.  The patient had evidence of changes of significant wall thickening  and pericolic inflammatory changes in the distal sigmoid colon consistent  with diverticulitis.  She was treated for five days and followup CT scan  showed increasing fluid collection.  This was subsequently drained and this  was a serous fluid with no growth on culture.  The patient presented  initially on September 22 with some drainage through her vagina.  She denied  having air through her vagina.  On followup in the office she has been noted  to have frank fecal drainage compatible with colovaginal fistula.  She has  not improved clinically though she has remained afebrile.  She has become  progressively weaker.  She still has tenderness in the right lower quadrant  and suprapubic area. White count is 9000. Sed rate is 56.  Repeat CT scan  shows persistent diverticulitis with thickening of the sigmoid colon.  Because the patient has failed to improve and because of her fecal fistula  is apparently increasing by history and by physical examination, it is felt  that she needs to be excised and since she has been treated for over a month  with oral and IV antibiotics, it is unlikely she has an abscess and we  should be able to do a reanastomosis.   PAST MEDICAL HISTORY:  1.  She has hypertension.  2.  Osteoarthritis  3.  Type 2 diabetes mellitus.  4.  Hyperlipidemia.  5.  Peripheral vascular disease.   PAST  SURGICAL HISTORY:  1.  Cholecystectomy.  2.  Hysterectomy.  3.  Right carpal tunnel release.  4.  Tonsillectomy.  5.  Left hemiarthroplasty, left shoulder.   FAMILY HISTORY:  Positive for stroke, atherosclerotic heart disease, chronic  obstructive lung disease.  She has a daughter with hypertension and mental  retardation.   PHYSICAL EXAMINATION:  VITAL SIGNS:  Blood pressure 152/90, pulse 80,  respirations 20, weight 148 pounds.  HEENT:  Unremarkable.  NECK:  Supple.  No JVD, bruits, adenopathy, or thyromegaly.  CHEST:  Clear to auscultation.  HEART:  Regular rate and rhythm without murmur, gallop or rub.  ABDOMEN:  Moderate tenderness in the right lower quadrant and suprapubic  area.  Normal active bowel sounds.  No masses.  PELVIC:  Tender vaginal apex with granulating tissue of the vaginal apex  with fecal drainage in the vaginal vault.  RECTAL:  Diffuse tenderness, stool guaiac positive.  No palpable masses.  EXTREMITIES:  No cyanosis, clubbing or edema.  NEUROLOGIC:  No focal motor, sensory or cerebellar  deficits.   IMPRESSION:  1.  Sigmoid diverticulitis.  2.  Colovaginal fistula.  3.  Hypertension.  4.  Osteoarthritis.  5.  Diabetes mellitus.  6.  Peripheral vascular disease.   PLAN:  The patient is admitted.  She will be continued on IV antibiotics.  She will receive enemas but will not receive a full bowel prep since her  p.o. intake has been marginal.  She will have exploratory laparotomy,  resection of her involved colon with potential for reanastomosis, but the  patient is informed that if there is severe infection in the pelvis, she  will have a diverting colostomy for six to eight weeks and will have her  colostomy closed at a later date.      Vernon Prey. [name], M.D.  Electronically Signed     LCS/MEDQ  D:  08/28/2005  T:  08/29/2005  Job:  [id number]

## 2011-04-21 NOTE — Discharge Summary (Signed)
[name], [name]             ACCOUNT NO.:  [id number]   MEDICAL RECORD NO.:  [id number]          PATIENT TYPE:  INP   LOCATION:  A211                          FACILITY:  [facility]   PHYSICIAN:  Barrie Folk. 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  The patient  was admitted for treatment of hyperkalemia with current potassium of 6.9  (via office labs on January 10, 2006, by [facility])  and dehydration (BUN 67 and creatinine 2.3 on January 10, 2006).  History  was positive for nausea and vomiting x1 week at the time of admission.  The  patient stated that upon eating, she experienced sour discomfort in her  stomach pertaining to food and therefore, she regurgitated.  The patient had  also experienced increased weakness.  Her last episode of vomiting was on  January 10, 2006.  History was negative for abdominal distention, diarrhea,  unexplained fever, etc.   ALLERGIES:  No known drug allergies.   HABITS:  Negative for use of tobacco, ethanol and street drugs.   FAMILY HISTORY:  Mother deceased at age 78 secondary to complications of  stroke.  Father deceased at age 71 secondary to heart attack.  One sister  living at age 30 with history of emphysema.  One daughter living at age 39  with history of mental retardation and hypertension.  Two sisters deceased  at age 37, secondary to complications of childbirth and age 69, secondary to  a cerebral aneurysm.   PAST MEDICAL HISTORY:  1.  Hospitalization for bacteremia in January 2007, at [facility].  2.  Nutrition compromise secondary to multi-factory causes.  3.  Severe debilitation secondary to severe chronic illness and chronic      depression in  December 2006.  4.  Colostomy on October 09, 2005, performed by Dr. Zella Richer at [facility] due to anastomotic leak.  5.  Cholecystectomy.  6.  Hysterectomy by Dr. Marnette Burgess in her 31s secondary to fibroids.  7.  Pregnancy.  8.  Right wrist surgery secondary to carpal tunnel syndrome.  9.  Tonsillectomy at age 37 at [facility].  10. Neck surgery at [facility].  11. Postoperative infection of right leg in February 2005, at [facility].  12. Right femoral popliteal bypass grafting in February 2005, by Dr. Daryel November.  13. Right hemiarthroscopy of the left shoulder by Dr. Arther Abbott.  14. Exploratory surgery secondary to colonic fistula by Dr. Irving Shows.   PHYSICAL EXAMINATION:  GENERAL:  An elderly, slightly small-framed, medium  height, alert, black female in no apparent respiratory distress.  VITAL SIGNS:  Temperature 96.9, respirations 20, blood pressure 146/92,  pulse 114.  Weight 141 pounds.  SKIN:  Warm and  dry.  HEENT:  Nose with negative discharge.  Mouth demonstrated no oral lesions.  Mucosa demonstrated no significant dryness.  LUNGS:  Clear.  HEART:  Audible S1, S2 with S3.  Rhythm was regular and rate was 108.  ABDOMEN:  Colostomy in the right upper quadrant.  Old, healed, mid upper  gastric surgical scar.  Soft, nontender in all four quadrants on palpation.  EXTREMITIES:  No significant edema.   LABORATORY DATA AND X-RAY FINDINGS:  On admission, sodium 135, potassium  6.8, chloride 112, CO2 19, BUN 64, creatinine 2.2.   HOSPITAL COURSE:  Problem 1.  DEHYDRATION:  The patient was treated with IV  fluids with using normal saline.  BUN showed a steady decline during this  hospitalization along with creatinine.  Last BUN was 25 and creatinine 1.6  on January 14, 2006.  The patient felt significantly better.  She remained  alert and oriented to person and place and time throughout this  hospitalization.   Appetite could be described as fair to good at times.  She  was discharged to her home on January 15, 2006.   Problem 2.  HYPERKALEMIA:  Serum potassium on admission was 6.8 and sodium  135.  The patient was treated with Kayexalate suspension.  Repeat serum  potassium on January 12, 2006, was 6.3.  Last serum potassium was 5.0 on  January 14, 2006.  The patient was admitted to medical floor on telemetry.  She incurred no nausea, vomiting or diarrhea during this hospitalization.  EKG demonstrated sinus rhythm with moderate voltage for left ventricular  hypertrophy and ventricular rate of 100.  The patient did not experience any  cardiac arrhythmias during this hospitalization.   Problem 3.  CHRONIC METABOLIC ACIDOSIS:  Serum CO2 on admission was 19.  The  patient was continued on treatment with sodium bicarbonate under the  supervision of nephrology, Dr. Lowanda Foster.  Last electrolytes were on January 14, 2006, demonstrating sodium 135, potassium 5.0, chloride 113, CO2 18.   Problem 4.  HYPERTENSION:  Blood pressure on admission was 146/92.  Lung  fields were clear.  Heart exam revealed audible S1, S2 with S3.  Rhythm was  regular with rate of 108.  The patient was treated with sodium restriction  and potassium restriction and antihypertensive medications.  The patient was  treated with Cardizem 30 mg p.o. q.6h. during this hospitalization.  Cardizem addressed her chronic mild tachycardia as well as her blood  pressure.  Blood pressure on the morning of discharge was 158/66 with a  pulse of 79.  The patient had no complaint of chest pain or shortness of  breath at the time of discharge.   Problem 5.  TYPE 2 DIABETES MELLITUS:  Serum glucose on admission was 108.  The patient was treated with Avandia 2 mg p.o. every day.  Metformin was  stopped because of elevated BUN and creatinine.  Dietitian was asked to see the patient during this hospitalization.  Last serum glucose was 109 on   January 14, 2006.  The patient was alert and oriented to person, place and  time at the time of discharge.  She denied polydipsia, polyuria or  polyphagia.   Problem 6.  CHRONIC DEPRESSION:  The patient was continued on Cymbalta 60 mg  p.o. daily.  Her mood was up at the time of discharge.  She had periods of  sadness during this hospitalization.  Appetite was better than previous  hospitalization.  She denied suicidal ideation.  The  patient appeared to be  much more upbeat pertaining to oral intake and attitude compared to previous  hospitalization.   Problem 7.  DIVERTICULOSIS:  The patient was asymptomatic.   Problem 8.  PERIPHERAL VASCULAR DISEASE:  The patient had healed, old  surgical scar of right groin and lower extremity.  Dorsalis pedis was  palpable bilaterally.  However, right dorsalis pedis was weaker than left.  The patient had no ulcers on lower extremity or discoloration.  Exam did not  demonstrated no coldness of lower extremity.  The patient required no  medical intervention.   Problem 9.  OSTEOARTHRITIS:  The patient did not complaint of joint pain,  joint hotness or joint swelling.  She required no medical intervention.   Problem 10.  COLOSTOMY, RIGHT UPPER QUADRANT:  Examination of right upper  quadrant demonstrated colostomy with bag intact.  The patient required no  intervention.  She received daily colostomy care during this  hospitalization.   DIET:  Carbohydrate-modified, low sodium and potassium, 1600 calories.   ACTIVITY:  No restrictions.   DISCHARGE MEDICATIONS:  1.  Megace suspension 40 mg per 5 mL, 4 tsp every day for appetite.  2.  Sodium bicarbonate 650 mg one tablet p.o. b.i.d.  3.  Cardizem 30 mg one tablet p.o. q.6h.  4.  Cymbalta 60 mg p.o. daily.  5.  Avandia 2 mg p.o. every day with breakfast.  6.  Clotrimazole cream to area around colostomy daily.   FOLLOW UP:  The patient will have followup in our office on January 22, 2006.  The  patient will be followed by [place].   DISCHARGE DIAGNOSES:  1.  Dehydration.  2.  Hyperkalemia.  3.  Colostomy in right upper quadrant.  4.  Hypertension.  5.  Chronic depression.  6.  Osteoarthritis.  7.  Diverticulosis.  8.  Chronic metabolic acidosis.  9.  Peripheral vascular disease.  10. Type 2 diabetes mellitus.      Barrie Folk. [name], MD  Electronically Signed     GKH/MEDQ  D:  01/15/2006  T:  01/16/2006  Job:  [id number]

## 2011-04-21 NOTE — Op Note (Signed)
[name], [name]             ACCOUNT NO.:  [id number]   MEDICAL RECORD NO.:  [id number]          PATIENT TYPE:  INP   LOCATION:  2628                         FACILITY:  [facility]   PHYSICIAN:  Odis Hollingshead, M.D.DATE OF BIRTH:  [birth year]   DATE OF PROCEDURE:  10/09/2005  DATE OF DISCHARGE:                                 OPERATIVE REPORT   PREOP DIAGNOSIS:  Colonic anastomotic leak with pelvic abscess.   POSTOPERATIVE DIAGNOSIS:  Colonic anastomotic leak with pelvic abscess.   PROCEDURE:  Transverse loop colostomy.   SURGEON:  Odis Hollingshead, M.D.   ASSISTANT:  Domingo Mend, PA   ANESTHESIA:  General.   INDICATIONS:  This is a 77 year old female who is approximately 6 to 7 weeks  status post a sigmoid colectomy with colorectal anastomosis who developed a  postoperative anastomotic leak. Drains have been in. She has had problems  with sepsis, respiratory failure. She now presents for loop colostomy for  diversion of fecal stream. The procedure and risks including but not limited  to bleeding, infection, wound healing problems, accidental damage to  abdominal organs, and anesthesia have explained to her, her niece and her  husband.   TECHNIQUE:  She was seen in the holding area and brought to the operating  room and placed supine on the operating room. General anesthetic was  administered.  The abdominal wall was sterilely prepped and draped. A  limited transverse right upper quadrant incision was made through the skin,  subcutaneous tissue, and muscle splitting was performed. The posterior  rectus sheath,  anterior rectus sheath and fascia were incised.  Once  entering the abdominal cavity,  I could identify transverse colon. I  dissected some omentum free from this using cautery and carefully brought  the transverse colon up to the fascial defect.  I Identified tinea coli. I  then made a small mesenteric defect and passed a Penrose drain through the  mesenteric  defect and elevated the transverse colon. A colostomy bar was  then used and placed through the mesenteric defect. I then made a transverse  incision in the colon and matured the loop colostomy with interrupted 3-0  Vicryl sutures. The colostomy bar was then anchored to the skin with nylon  suture. This colostomy appliance was applied.   She tolerated the procedure without apparent complications was taken to  recovery in satisfactory condition.      Odis Hollingshead, M.D.  Electronically Signed     TJR/MEDQ  D:  10/09/2005  T:  10/09/2005  Job:  [id number]

## 2011-04-21 NOTE — Discharge Summary (Signed)
[name], [name]             ACCOUNT NO.:  [id number]   MEDICAL RECORD NO.:  [id number]          PATIENT TYPE:  INP   LOCATION:  A309                          FACILITY:  [facility]   PHYSICIAN:  Barrie Folk. 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 Chief complaint was persistent  lower abdominal pain x3 weeks. The pain was described as a stabbing  sensation confined to the lower pelvic area at time of admission. The  patient also felt at times as though knives were sticking in her lower  abdomen. She was admitted on May 31, 2005 for these symptoms. An x-ray of  the abdomen demonstrated a normal gas pattern. There was no evidence of  dilated loops or air fluid level. Moreover there was no evidence of free  intraperitoneal air. Surgical clips were seen in the higher upper quadrant  from prior cholecystectomy performed by Dr. Earle Gell. The patient's  symptoms appeared to wax and wane since her hospitalization of May 31, 2005. She was treated with p.o. antibiotics for a urinary tract infection.  History was negative for fever, chills, melena, hematochezia, or abdominal  distention. Due to the persistence of the lower abdominal pain, a CT of the  abdomen was ordered on July 25, 2005.  CT of the abdomen demonstrated peri-  pelvic left renal cyst, cholecystectomy, and no acute abdominal abnormality.  CT of the pelvis on July 25, 2005 demonstrated a very small umbilical  hernia containing fat. No diffuse diverticula was present but evidence of  significant wall thickening and pericolic inflammatory changes in the distal  sigmoid colon compatible with diverticulitis. A small amount of free fluid  was seen with a focal rounded fluid collection in the right  lower pelvis,  adjacent to the rectosigmoid colon, suspicious for a diverticular abscess 4  x 4 by 3.8 cm. The patient had no pelvic free air or extraluminal gas  pattern present. The uterus was surgically absent and the ovaries were not  localized. Small bowel loops were unremarkable. The appendix was not  identified.   ALLERGIES:  The patient is not allergic to any known medication.   HABITS:  Negative for tobacco and ethanol.   PAST MEDICAL HISTORY:  1.  A past hospitalization for cholecystectomy by Dr. Irving Shows at [facility].  2.  Hysterectomy in her 63's secondary to fibroids.  3.  Pregnancy.  4.  Right wrist surgery secondary to carpal tunnel syndrome.  5.  Tonsillectomy at age 46 at [facility].  6.  Surgery on lower extremity at [facility] approximately 5 years      ago.  7.  Postoperative infection of right leg in February, 2005.  8.  Surgery on left shoulder (left hemiarthroplasty) by Dr. Aline Brochure due to      severe osteoarthritis  FAMILY HISTORY:  Mother deceased at age 16 secondary to complications of  stroke. Father deceased at 58 secondary to a heart attack. One sister  living at age 9 with history of emphysema. One daughter living at age 35  with history of mental retardation and hypertension. Two sisters, one  deceased at age 54 secondary to complications of childbirth and one in her  53's secondary to cerebral aneurysm.   PROBLEM 1. Sigmoid diverticulitis. General appearance is of an elderly,  slightly small framed black female who appeared not to feel well but in no  apparent respiratory distress. Lungs were clear. Heart revealed an audible  S1, S2 without murmur. Rhythm was regular rate within normal limits. The  abdomen was slightly obese with hypoactive bowel sounds. Abdominal exam  revealed an old healed hypogastric surgical scar and an old healed right  upper quadrant surgical scar, and a healed right lower quadrant  surgical  scar. Abdominal exam was positive for left lower quadrant tenderness on  palpation. Abdominal exam did not demonstrate a clear palpable mass or  organomegaly. Rectal exam revealed no external lesion. Digital exam revealed  no mass in the rectal vault. Stool was guaiac negative. The patient was  treated with IV fluids, Intravenous antibiotics, a clear liquid diet, and a  surgical consult.   SIGNIFICANT LABS ON ADMISSION:  A white count of 7400, hemoglobin 11.4,  hematocrit 34.2. Sodium 136, potassium 3.6, chloride 101, CO2 23, glucose  102, BUN 8, creatinine 1.2, calcium 8.9, total protein 7.4, albumin 3.3, AST  14, ALT 11, ALP 79, total bilirubin 0.3.   The patient's hospital course was uneventful. She clinically improved. Her  last white count was 8600 on July 30, 2005. The patient was afebrile at  time of discharge. She was alert and oriented to person, place and the time  at time of discharge. She was on a full liquid diet with Ensure supplement  at the time of discharge.   Repeat guaiac of stool during this hospitalization was negative. Erythrocyte  sedimentation rate was 36 on July 25, 2005, and 37 on August 01, 2005. The  patient was discharged home on antibiotics. She will follow up with Dr.  Irving Shows, surgeon for reevaluation. Repeat CT of the abdomen on July 29, 2005 had findings consistent with sigmoid diverticulitis with some  progression in the bowel thickness since the prior study of July 25, 2005.  The right lower pelvic fluid collection was slightly large and compatible  with abscess as read by Dr. Carl Best.   PROBLEM 2.  Right lower pelvic fluid collection of unclear etiology. The  patient was not toxic on admission or throughout this hospitalization. The  fluid collection was not certain clinically to be an abscess. The CT of the  abdomen was discussed with Dr. Reginal Lutes radiologist, who arranged for transfer to [facility] on  August 02, 2005 for a diagnostic CT  procedure. A CT guided procedure was done for fluid aspiration. A collection  of 45 cc of thin serosanguineous fluid was aspirated. Radiology indicated  this was consistent with a seroma or loculated ascites. No drain was  inserted. A sample was sent for Gram stain and culture. The patient was  returned to [facility] without problem. The patient will have a  repeat CT of the pelvis in approximately 4 weeks.   PROBLEM 3. Hypertension. Her blood pressure on admission demonstrated a  value of 155/89 with a pulse of 92. Lungs  were clear. Heart exam revealed an  audible S1, S2 without murmur. Rhythm was regular. The patient was treated  with a low sodium diet, verapamil 240 mg p.o. daily. Blood pressure was  controlled during this hospitalization. The patient did not complain of  chest pain, palpitations or shortness of breath. Blood pressure on the  morning of discharge was 156/80. Pulse was 76.   PROBLEM 4. Hyperlipidemia. A lipid profile during this hospitalization on  July 26, 2005 at 0535 demonstrated the following:  Total cholesterol 198  mg/dl, triglycerides 83 mg/dl, HDL 40 mg/dl, and LDL 141 mg/dl. The patient  will be started on a Statin as an outpatient in addition to a low  cholesterol diet.   PROBLEM 5. Type 2 diabetes mellitus. A serum glucose on admission was 102.  Accu-check was done every day during this hospitalization. Hemoglobin A1C  was 7.1% (normal 4.6 to 6.1%). The patient will have an appointment with the  dietician as an outpatient. Accu-checks will be continued daily. The patient  is not on any oral hypoglycemic agent or Insulin at this time. Treatment  will be based on blood sugar monitoring as an outpatient.   PROBLEM 6. Osteoarthritis. The patient is chronically complaining of  multiple joint pain and stiffness. Surgery has been done on her left  shoulder because of severe osteoarthritis. The patient was treated  with  Toradol initially, 30 mg IV q.6h. She will be continued on analgesia for  arthritic pain. Nonsteroidals will be used along with PPI to protect her  stomach. Examination of joints reveal stiffness of knees and both shoulders  bilaterally. Examination of the joints demonstrated no swelling, redness or  warmth during this hospitalization.   PROBLEM 7. Peripheral vascular disease. Examination of lower extremities  demonstrated no edema and no discoloration. No ulcers. The patient has a  scar involving the medial aspect of her right lower extremity due to surgery  for treatment of peripheral vascular disease. Her lower extremities revealed  palpable femoral arteries bilaterally and palpable dorsalis pedis.  Examination of the extremities revealed no coldness as well. The patient  will be continued on Plavix 75 mg p.o. daily, or Pletal as directed.   INSTRUCTIONS AT TIME OF DISCHARGE:  Diet low salt, watch cholesterol. Activity with no restrictions. Walk daily.   MEDICATIONS:  1.  Verapamil 240 mg 1 tablet p.o. daily.  2.  Prevacid 15 mg p.o. daily.  3.  Doxycycline 100 mg p.o. b.i.d.  4.  Oxycodone/acetaminophen 5/325 1 tablet p.o. q.6-8h p.r.n. pain.  5.  Flagyl 500 mg 1 tablet p.o. t.i.d.  6.  Flora Q 1 capsule p.o. daily.  7.  Simvastatin 20 mg 1 tablet p.o. daily.  8.  Plavix 75 mg p.o. daily.  9.  Aspirin 81 mg p.o. daily.   FOLLOW UP:  With Dr. Irving Shows on August 13, 2005.   FINAL PRIMARY DIAGNOSES:  1.  Abdominal pain secondary to sigmoid diverticulitis.  2.  Right pelvic fluid collection, etiology pending.   SECONDARY DIAGNOSES:  1.  Hypertension.  2.  Peripheral vascular disease.  3.  Hyperlipidemia.  4.  Osteoarthritis.  5.  Type 2 diabetes mellitus.      Barrie Folk. [name], MD  Electronically Signed     GKH/MEDQ  D:  08/05/2005  T:  08/05/2005  Job:  [id number]   cc:   Vernon Prey. [name], M.D.  [address]  [place]  [place] [address]  Fax: [phone]

## 2011-04-21 NOTE — Group Therapy Note (Signed)
NAME:  [name], [name]                       ACCOUNT NO.:  [id number]   MEDICAL RECORD NO.:  [id number]                   PATIENT TYPE:  INP   LOCATION:  A339                                 FACILITY:  [facility]   PHYSICIAN:  Carole Civil, M.D.           DATE OF BIRTH:  March 27, 1926   DATE OF PROCEDURE:  DATE OF DISCHARGE:                                   PROGRESS NOTE   POSTOPERATIVE CHECK:  Status post left hemiarthroplasty of the left  shoulder.   Postoperative radiograph shows shoulder to be in good position.  Left upper-  extremity neurovascular checks were normal.  The patient is in no pain at  this time.  Recommend followup protocol as ordered.  Follow up tomorrow.      ___________________________________________                                            Carole Civil, M.D.   SEH/MEDQ  D:  07/29/2004  T:  07/30/2004  Job:  [id number]

## 2011-04-21 NOTE — Op Note (Signed)
[name], [name]             ACCOUNT NO.:  [id number]   MEDICAL RECORD NO.:  [id number]          PATIENT TYPE:  INP   LOCATION:  A325                          FACILITY:  [facility]   PHYSICIAN:  Vernon Prey. [name], M.D.   DATE OF BIRTH:  1926/06/21   DATE OF PROCEDURE:  DATE OF DISCHARGE:  11/11/2005                                 OPERATIVE REPORT   PREOPERATIVE DIAGNOSIS:  Severe protein calorie malnutrition.   POSTOPERATIVE DIAGNOSIS:  Severe protein calorie malnutrition.   PROCEDURE:  Right subclavian Port-A-Cath insertion.   SURGEON:  Vernon Prey. [name], M.D.   DESCRIPTION:  The procedure was done under maximum IV sedation with  anesthetic monitoring and with local infiltration with 1% Xylocaine. The  patient was taken to the operating suite. She was given IV sedation by  anesthesia. The left chest was prepped and draped in the sterile field.  Local infiltration with 1% Xylocaine was carried out. She had a subclavian  that had been previously placed about three days prior to this procedure.  This was exchanged over a guide wire. Once this was done, repeat prep was  carried out. The tunnel for the catheter and the pocket for the chamber was  developed without difficulty. The catheter was then positioned in the  superior vena cava under fluoroscopic guidance. It was tunneled without  difficulty. It was then attached to the chamber. It was flushed with  heparinized saline. Position was confirmed, and the chamber was placed in  the subcutaneous pocket. It was sutured to the fascia with 3-0 Prolene. The  pocket was irrigated after which it was closed. The subcutaneous tissue was  closed with 3-0 Monocryl and the skin was closed with 4-0 Vicryl. Position  was reconfirmed. The system was flushed with heparin. The patient tolerated  the procedure well. Dressings were placed. She was transferred to a bed and  taken to the postanesthetic care unit for followup.  Follow-up chest x-ray  to be  done.      Vernon Prey. [name], M.D.  Electronically Signed     LCS/MEDQ  D:  01/02/2006  T:  01/03/2006  Job:  [id number]

## 2011-04-21 NOTE — Consult Note (Signed)
[name]             ACCOUNT NO.:  [id number]   MEDICAL RECORD NO.:  [id number]          PATIENT TYPE:  OBV   LOCATION:  [id number]                          FACILITY:  [facility]   PHYSICIAN:  [name], M.D.DATE OF BIRTH:  03-Jan-1926   DATE OF CONSULTATION:  01/12/2006  DATE OF DISCHARGE:                                   CONSULTATION   REASON FOR CONSULTATION:  Hyperkalemia and elevated BUN and creatinine.   [name] is 77 years old African-American who is seen by me in November 06, 2005 because of her electrolyte imbalance.  Presently admitted to the  hospital because of hyperkalemia and worsening of renal failure; hence, a  consult is called.  During her last admission, the patient was found to have  acute renal failure secondary to prerenal syndrome, hydrated.  Creatinine  came down to 1.2 presently.  She came back with elevated BUN and creatinine  and hyperkalemia.  The patient with a previous history of diabetes, history  of also diverticulosis status post cholecystectomy.  Presently, she offers  no complaint.  The patient states that she usually eats lots of banana and  also she likes orange juice.   PAST MEDICAL HISTORY:  As stated above.  1.  Patient with previous history of acute renal failure.  2.  Metabolic acidosis with electrolyte imbalance and hyperkalemia.  3.  History of type 2 diabetes.  4.  History of hypertension.  5.  History of peripheral vascular disease.  6.  History of thrombocytopenia.  7.  History of arthritis.  8.  History of colovaginal fistula with a leak.  9.  History of bradycardia.  10. History of thrombocytopenia.   PAST SURGICAL HISTORY:  1.  Status post cholecystectomy with a collecting tube.  2.  History of hysterectomy.  3.  History of cholecystectomy.  4.  History of right carpal tunnel syndrome and surgeries.   ALLERGIES:  No known drug allergies.   CURRENT MEDICATIONS:  1.  Cardizem 360 mg __________ p.o. q.6h.  2.   Megace 800 mg p.o. daily.  3.  Protonix 40 mg p.o. daily.  4.  Avandia 10 mg p.o. daily.   She is getting IV fluids at 50 mL/hr.   SOCIAL HISTORY:  No history of smoking.  No history of alcohol abuse.   REVIEW OF SYSTEMS:  No complaints.  She denies any chest pain.  No nausea,  no vomiting, __________ urgency or frequency.   PHYSICAL EXAMINATION:  GENERAL:  On examination, the patient is alert.  She  is in no apparent distress.  VITAL SIGNS:  Temperature 98.3, pulse 90, blood pressure 143/67.  HEENT:  No conjunctival pallor.  __________ seems to be dry.  NECK:  Supple.  No JVD.  CHEST:  Clear to auscultation.  No rales, no rhonchi, no egophony.  HEART:  Revealed a regular rate and rhythm.  No murmur.  ABDOMEN:  Soft, positive bowel sounds.  She has a colostomy bag on the right  side.  Nontender.  EXTREMITIES:  No edema.   She has about 300 mL of urine output since she  came.   LABORATORY DATA:  Her labs from today:  Sodium 132, potassium 6.3.  When she  came yesterday, it was 6.8.  BUN is 56, creatinine 1.9.  When she came BUN  of 64 and creatinine 2.2.  Her albumin is 3.4, calcium is 8.  As stated  above, her creatinine was 1.1 and BUN of 20 on January 17 with normal  potassium.   ASSESSMENT:  1.  Renal insufficiency.  This is still acute since the patient has a      cholecystectomy and drains watery fluid.  At this moment, the patient      may be losing significant amount of fluid without any real replacement;      hence leading to dehydration and prerenal syndrome.  2.  Hyperkalemia.  Recurrent problem.  At this moment, probably it may be      associated with her azotemia accompanied with high potassium intake.      Potassium is 6.3 today.  Seems to be improving.  3.  Diabetes.  4.  Hypertension.  BP seems to be controlled very well.  5.  History of anemia thought to be secondary to iron deficiency.  6.  History of depression.  7.  History of peripheral vascular  disease.   RECOMMENDATIONS:  We will give her a dose of Kayexalate.  Will increase her  IV fluids to 110 mL per hour.  The patient advised to drink lots of fluid  and also to avoid foods containing high potassium which seems to be the  biggest issue here.  Will continue other medications and will follow up  patient.      [name], M.D.  Electronically Signed     BB/MEDQ  D:  01/12/2006  T:  01/12/2006  Job:  [id number]

## 2011-04-21 NOTE — H&P (Signed)
[name], [name]             ACCOUNT NO.:  [id number]   MEDICAL RECORD NO.:  [id number]          PATIENT TYPE:  OBV   LOCATION:  A211                          FACILITY:  [facility]   PHYSICIAN:  Barrie Folk. 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  The patient  is admitted for treatment of hyperkalemia (Current potassium of 6.9 via  office labs on January 10, 2006, [place].) and dehydration  (BUN 67, creatinine 2.3 on January 10, 2006).  History is positive for  nausea and vomiting for one week.  The patient states that upon eating, she  experienced sour discomfort of stomach pertaining to food and, therefore,  she regurgitates.  The patient has been experiencing increased weakness as a  result of.  Her last episode of vomiting was on January 10, 2006.  History  is negative for abdominal distention, diarrhea, unexplained fever, runny  nose, sore throat, cough, earache, etc.   MEDICAL HISTORY:  Positive for:  1.  Colostomy right upper quadrant.  2.  Chronic anemia with iron-deficiency.  3.  Hypertension.  4.  Chronic depression.  5.  Osteoarthritis.  6.  Diverticulosis.  7.  Chronic metabolic acidosis.  8.  Peripheral vascular disease.  9.  Type was diabetes mellitus.   Medical history is negative for tuberculosis, cancer, asthma, and seizure  disorder.   MEDICATIONS ON ADMISSION:  1.  Megace suspension 40 mg/mL, four teaspoons every day, appetite.  2.  Sodium bicarbonate 650 mg p.o. b.i.d.  3.  Cardizem 30 mg one tablet every six hours.  4.  Cymbalta 60 mg p.o. every day.  5.  Avandamet 2/500 one tablet every day with breakfast.  6.  Clotrimazole/betamethasone cream to area around colostomy daily.   ALLERGIES:  The  patient is not allergic to any known medications.   HABITS:  Negative for use of tobacco, alcohol or street drugs.   FAMILY HISTORY:  Mother deceased, age 25, secondary to complications of  stroke.  Father deceased at 73 secondary to heart attack.  One sister  living at age 24 with history of emphysema.  One daughter living at age 27  with history of mental retardation and hypertension.  Two sisters deceased  at age 80 secondary to complications of childbirth and one at age 74  secondary to cerebral aneurysm.   REVIEW OF SYSTEMS:  Positive for periods of melancholy from chronic  stiffness and pain of multiple joints, episodic watery stools, episodic  discomfort of her right leg, episodic nausea and vomiting.  Review of  systems negative for headache, epistaxis, bleeding gums, shortness of  breath, syncope, dizziness, dysuria, gross hematuria, lumps in breasts,  discharge from breasts.   PAST MEDICAL HISTORY:  Positive for hospitalization for:  1.  Bacteremia in January 2007 at [facility].  2.  Nutrition  compromise secondary to multifactorial causes (discoid      debilitation secondary to severe chronic illness and chronic depression)      in December 2006.  3.  Colostomy on October 09, 2005 performed by Dr. Zella Richer at [facility]  due to colonic anastomotic leak.  4.  Hospitalization for cholecystectomy.  5.  Hysterectomy by Dr. Marnette Burgess in her 64's secondary to fibroids.  6.  Pregnancy.  7.  Right wrist surgery secondary to carpal tunnel syndrome.  8.  Tonsillectomy at age 84 at [facility].  9.  Neck surgery at [facility].  10. Postoperative infection of right leg in February 2005 at [facility].  11. Right femoral popliteal bypass grafting in February 2005 by Dr. Zada Girt.  12. Right hemiarthoscopy of the left shoulder by Dr. Arther Abbott.  13. Exploratory surgery due to colonic  fistula by Dr. Irving Shows.   PHYSICAL EXAMINATION:  VITAL SIGNS:  Temperature 96.9, respirations 20,  blood pressure 146/92, pulse 114, weight 141 pounds.  GENERAL:  General appearance reveals an elderly, slightly small frame,  medium height, alert, black female in no apparent respiratory distress.  HEENT:  Normocephalic, atraumatic.  Ears - normal auricles, external canal  patent, tympanic membranes pearly gray.  Eyes:  Lids negative ptosis,  sclerae white.  Pupils round, equal and reactive to light.  Extraocular  movement intact.  Nose negative for discharge.  Mouth positive for missing  teeth.  Remaining dentition fair.  No bleeding gums.  No oral lesions.  Posterior pharynx benign.  Mucosa:  No significant dryness.  NECK:  Negative for lymphadenopathy or thyromegaly.  LUNGS:  Clear.  HEART:  Audible S1 and S2 with an S3.  Rhythm regular with a rate of 108.  CHEST:  Positive for Port-A-Cath.  BREASTS:  Pendulous.  No skin changes.  No nodules on palpation, negative  nipple discharge.  ABDOMEN:  Positive for colostomy or right upper quadrant.  Positive healed  old mid hypogastric surgical scar.  No distention.  Soft.  Nontender in all  four quadrants.  GENITALIA:  Positive external genitalia.  Normal female.  Right groin  positive for old healed surgical scar.  Palpable femoral arteries  bilaterally.  EXTREMITIES:  Lower extremities: Right thigh medial aspect positive for old  healed surgical scar.  Positive old surgical scar medial aspect of right  tibia medially.  Feet palpable dorsalis pedis bilaterally (right weaker than  left).  No discoloration of lower extremities. No coldness of lower  extremities.   LABORATORY DATA:  [place]. labs on January 10, 2006 were as  follows:  White count 11,600, hemoglobin 12.4, hematocrit 37.9, platelets  262,000.  Glucose 118, BUN 67, creatinine 2.3, sodium 37, potassium 6.9,  chloride 109, CO2 13, calcium 10.0. On December 18, 2005, sodium 136,  potassium 4.2, chloride 107, CO2 20, BUN  29, creatinine 1.2.   IMPRESSION:  Primary:  1.  Dehydration.  2.  Hyperkalemia.   Secondary:  1.  Chronic depression.  2.  Hypertension.  3.  Colostomy right upper quadrant.  4.  Osteoarthritis.  5.  Diverticulosis.  6.  Chronic metabolic acidosis.  7.  Peripheral vascular disease.   PLAN:  Intravenous fluids, Kayexalate suspension 15 g per 60 mL q.i.d. for  one day, sodium bicarbonate 650 mg p.o. b.i.d., Cymbalta 60 mg p.o. daily,  Megace suspension 5 mg  per mL four teaspoons p.o. every day for appetite,  Protonix 40 mg IV daily, Avandia 2 mg p.o. every day with breakfast.  Nephrology consultation by Dr. Lowanda Foster.  Diet: Carbohydrate modified, 1600  calorie.   LABORATORY DATA:  MET-7, liver function tests, lipid profile, EKG, repeat  MET-7 on January 12, 2005, IV normal saline at 100 mL/hour for 24 hours and  then 50 mL per hour, bedside commode.      Barrie Folk. [name], MD  Electronically Signed     GKH/MEDQ  D:  01/11/2006  T:  01/12/2006  Job:  [id number]

## 2011-04-21 NOTE — H&P (Signed)
[name], [name]             ACCOUNT NO.:  [id number]   MEDICAL RECORD NO.:  [id number]          PATIENT TYPE:  INP   LOCATION:  A309                          FACILITY:  [facility]   PHYSICIAN:  Barrie Folk. 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   CHIEF COMPLAINT:  Persistent lower abdominal pain x3 weeks.   The pain is described as a stabbing sensation, confined to the lower pelvic  area at the time of admission.  The patient at times feels as if someone is  sticking knives in her lower abdomen.  She was admitted on June 30, 2005 for  these symptoms.  X-ray of the abdomen demonstrated normal gas pattern.  There was no evidence of dilated bowel loops or air/fluid levels.  There was  no evidence of free intraperitoneal air.  Surgical clips were seen in her  right upper quadrant from prior cholecystectomy, as read by Dr. Earle Gell.  The patient's symptoms appeared to wax and wane since hospitalization of  June 30, 2005.  She was treated with p.o. antibiotics for urinary tract  infection.  History is negative for complaint of fever, chills, melena,  hematochezia, and abdominal distention.  Due to the persistency of the lower  abdominal pain, a CT of the abdomen was ordered on July 25, 2005.  CT of  the abdomen demonstrated peripelvic left renal cyst, cholecystectomy, and no  acute upper abdominal abnormality.  CT of the pelvis on July 25, 2005  demonstrated very small umbilical hernia containing fat.  A few sigmoid  diverticula with evidence of significant wall thickening and pericolic  inflammatory changes in the distal sigmoid colon, compatible with  diverticulitis.  A small amount of free pelvic fluid with a focal rounded  fluid collection in the right  lower pelvis, adjacent to the rectosigmoid  colon, suspicious for diverticular abscess, 4.5 x 3.8 cm fluid collection.  No free pelvic air or extraluminal gas was seen.  The uterus was surgically  absent, and ovaries not localized.  Small bowel loops were unremarkable.  Appendix was not identified.  The patient was therefore admitted for IV  antibiotics.  She also will have a surgical consult by Dr. Mohammed Kindle.   Medical history is also positive for hypertension, osteoarthritis, type 2  diabetes mellitus, hyperlipidemia, and peripheral vascular disease.  Medical  history is negative for tuberculosis, cancer, sickle cell, asthma, and  seizure disorder.   MEDICATIONS ON ADMISSION:  1.  Oxycodone/acetaminophen 5/325 1 tablet p.o. q.6-8h. p.r.n. pain.  2.  Verapamil 240 mg 1 caplet p.o. daily.  3.  Prevacid 15 mg p.o. daily.  4.  Xanax 0.25 mg p.o. daily.   The patient is not allergic to any known medications.   HABITS:  Negative for tobacco, alcohol, or street drugs.   PAST MEDICAL HISTORY:  1.  Positive for  hospitalization for cholecystectomy by Dr. Mohammed Kindle at      [facility].  2.  Hysterectomy by Dr. Marnette Burgess in her 14's secondary to fibroids.  3.  Pregnancy.  4.  Right wrist surgery secondary to carpal tunnel syndrome.  5.  Tonsillectomy at age 68 at [facility].  6.  Neck surgery at [facility] five years ago.  7.  Postoperative infection of the right leg in February, 2005.  8.  Surgery on the left shoulder (left hemiarthroplasty) by Dr. Aline Brochure due      to severe osteoarthritis.   FAMILY HISTORY:  Mother deceased at age 83 secondary to complications of  stroke.  Father deceased at 11 secondary to heart attack.  One sister  living at age 4 with history of emphysema.  One daughter living at age 7  with history of mental retardation and hypertension.  Two sisters deceased,  one at age 18 secondary to complications of childbirth, and one in her 71s   secondary to cerebral aneurysm.   REVIEW OF SYSTEMS:  Positive for chronic multiple joint pain and stiffness.  Chronic episodic constipation and episodic insomnia.  Review of systems is  negative for chronic headache, epistaxis, bleeding gums, dysphagia,  shortness of breath, hemoptysis, chest pain, syncope, dizziness,  hematemesis, dysuria, melena, weight loss, wheezing, lumps in breasts,  discharge from breasts.   PHYSICAL EXAMINATION:  VITALS:  Temperature 98.6, pulse 92, respirations 20,  blood pressure 155/89.  Weight 148.3 pounds.  GENERAL APPEARANCE:  An elderly, slightly small framed black female who  appeared not to feel well but no apparent respiratory distress.  HEENT:  Head is normocephalic.  Ears:  Normal auricles.  Eyes and lids:  Negative ptosis.  Sclerae are white.  Pupils are equal, round and reactive  to light.  Extraocular movements are intact.  Nose:  Negative discharge.  Mouth:  Positive for missing teeth.  The remaining dentition fair.  No  bleeding gums.  Posterior pharynx benign.  NECK:  Negative for lymphadenopathy or thyromegaly.  LUNGS:  Clear.  HEART:  Audible S1 and S2 without murmur.  Regular rhythm.  BREASTS:  No skin changes.  Nipples without discharge.  ABDOMEN:  Slightly obese.  Hypoactive bowel sounds.  Positive old healed mid  hypogastric surgical scar.  Positive for healed old right lower quadrant  surgical scar.  Positive for right lower quadrant tenderness and left lower  quadrant tenderness with palpation.  No palpable mass.  No organomegaly.  PELVIC:  External genitalia, normal female.  Introitus, no lesion.  Vaginal  vault, no lesion.  Cervix:  Slight grayish discharge.  Bimanual:  No adnexal  masses.  RECTAL:  No external lesions.  Digital exam:  No rectal vault masses.  Stool  guaiac negative.  EXTREMITIES:  No edema.  No joint swelling.  No joint redness.  No joint  heat. NEUROLOGIC:  Alert and oriented to person, place, and time.  Cranial  nerves  II-XII appeared intact.   LABS:  Pending CBC, erythrocyte sedimentation rate, MET-7, urinalysis.  Stool for WBCs.   IMPRESSION:  1.  Sigmoid diverticulitis.  2.  Rectal sigmoid colon diverticular abscess.   SECONDARY DIAGNOSES:  1.  Hypertension.  2.  Peripheral vascular disease.  3.  Hyperlipidemia.  4.  Osteoarthritis.  5.  Type 2 diabetes mellitus.   PLAN:  1.  IV fluids.  2.  Blood cultures x2.  3.  IV antibiotics.  4.  Hemoglobin A1C, fasting lipid profile.  5.  Diet:  Clear liquids.  6.  Accu-Chek daily.  7.  Surgical consult, Dr. Mohammed Kindle.  8.  Repeat CT of the abdomen, including pelvis, in approximately 5-7 days.  9.  Verapamil 240 mg p.o. daily.  10. Toradol 30 mg IV q.6h. x5 days.  11. Prevacid 30 mg p.o. daily.  12. Hold MiraLax.  13. Colace 100 mg 3 tablets p.o. b.i.d.      Barrie Folk. [name], MD  Electronically Signed     GKH/MEDQ  D:  07/25/2005  T:  07/25/2005  Job:  [id number]

## 2011-04-21 NOTE — H&P (Signed)
NAME:  [name], [name]                       ACCOUNT NO.:  [id number]   MEDICAL RECORD NO.:  [id number]                   PATIENT TYPE:   LOCATION:                                       FACILITY:   PHYSICIAN:  Carole Civil, M.D.           DATE OF BIRTH:  [birth year]   DATE OF ADMISSION:  DATE OF DISCHARGE:                                HISTORY & PHYSICAL   CHIEF COMPLAINT:  Left shoulder pain.   This is a 77 year old female with pain in the left shoulder for several  years. She also had cervical spine disease treated with epidural steroids  with moderate improvement. Specifically, she has x-ray documented evidence  of cervical disk disease with spinal stenosis and degenerative disease at C5-  6, C6-7, and C4-5.   She has pain in the left shoulder for which I have treated with analgesics  including Lorcet Plus for several months. Her range of motion continues to  deteriorate, and her activities of daily living continue to be more  difficult, and she is presenting now for left shoulder replacement.   PAST MEDICAL HISTORY:  She has a history of hypertension. Her family  physician is Dr. [name]. She has had an appendectomy, hysterectomy, right  carpal tunnel release, bypass grafts right and left lower extremity. She had  a tonsillectomy. She had a cholecystectomy. Her pharmacy used is [facility].   FAMILY HISTORY:  She has a family history of heart disease and arthritis.   SOCIAL HISTORY:  She is married. Does not smoke or drink. Highest grade  completed 12th.   PHYSICAL EXAMINATION:  GENERAL:  She is 106 pounds with a pulse of 80 and a  respiratory rate of 20. She has normal development, grooming, and hygiene.  Her height is less than average. She has no deformity. Her grooming was  normal.  CARDIOVASCULAR:  Upper extremities, no swelling or varicosities. Pulses were  normal. Temperature normal. No edema. No edema, no tenderness. Cervical  lymph nodes were  normal.  MUSCULOSKELETAL:  Left shoulder flexion 90 degrees, external rotation 0  degrees. Will require subscapular release most likely and capsular relief.  Strength and muscle tone normal at the elbow, wrist, and hand. Cuff  strength:  Could not test due to limitations in motion and pain. Neck motion  decreased with obvious crepitus. No muscle tone. No spasm. No pathologic  reflexes such as the Hoffman maneuver.  SKIN:  Normal for except for scars from surgery.  NEUROLOGICAL:  Deep tendon reflexes normal. Sensation normal. Mental status  normal. Mood normal.   RADIOLOGIC FINDINGS:  Radiographs on July 13, 2004 show glenohumeral  arthritis, large inferior spur, glenoid version looked fine, no erosion was  noted. Glenoid replacement may or may not be necessary.   IMPRESSION:  Osteoarthritis, left shoulder with pain. Recommend left  shoulder arthroplasty plus or minus glenoid. Code 715.11/719.41/23470.     ___________________________________________  Carole Civil, M.D.   SEH/MEDQ  D:  07/25/2004  T:  07/25/2004  Job:  [id number]

## 2011-04-21 NOTE — Consult Note (Signed)
[name], [name]             ACCOUNT NO.:  [id number]   MEDICAL RECORD NO.:  [id number]          PATIENT TYPE:  INP   LOCATION:  [id number]                          FACILITY:  [facility]   PHYSICIAN:  Alison Murray, M.D.DATE OF BIRTH:  1926/09/07   DATE OF CONSULTATION:  11/24/2005  DATE OF DISCHARGE:                                   CONSULTATION   REASON FOR CONSULTATION:  Metabolic disorders.   HISTORY OF PRESENT ILLNESS:  [name] is a 77 year old African American  female who is known to me who has history of hypertension, history of  diabetes, history of anemia, initially seen for acute renal failure which  has recovered presently.  The patient is admitted again because of some  nausea.  Unable to keep anything down.  During her last admission, the  patient had surgery for colorectal anastomosis leak with colovaginal  fistula.  She had colostomy __________.  Presently, the patient feels  better.  She denies any shortness of breath, dizziness or lightheadedness.   PAST MEDICAL HISTORY:  1.  As stated above, patient with history of hypertension.  2.  History of type 2 diabetes.  3.  History of peripheral vascular disease.  4.  History of thrombocytopenia.  5.  History of acute renal failure.  6.  History of respiratory failure.  She was on a vent.  7.  History of severe malnutrition.  8.  History of posterior arthritis.  9.  History of colovaginal fistula with leak.  10. History of colostomy.  11. History of bradycardia.   PAST SURGICAL HISTORY:  1.  Status post colostomy.  2.  History of hysterectomy.  3.  History of cholecystectomy.  4.  History of right carpal tunnel release.   SOCIAL HISTORY:  No history of smoking or alcohol abuse.   MEDICATIONS:  1.  Cardizem 30 mg p.o. q.6 h.  2.  Novolin insulin.  3.  Megace.  4.  Remeron 30 mg p.o. daily.  5.  Zofran.  6.  IV fluid at 50 cc per hour.  7.  Methadone.  8.  TPN 65 cc per hour.   ALLERGIES:  No known drug  allergies.   REVIEW OF SYSTEMS:  At this moment, there is no shortness of breath.  No  orthopnea, no paroxysmal nocturnal dyspnea.  Said she has some nausea, but  no vomiting.  She denies any chest pain.  She denies any swelling of her  legs.   PHYSICAL EXAMINATION:  GENERAL APPEARANCE:  Alert, no apparent distress.  VITAL SIGNS:  Blood pressure 147/63, temperature 98.1, pulse 98, respiratory  rate 20.  HEENT:  No conjunctival pallor.  Nonicteric.  Oral mucosa seems to be dry.  NECK:  Supple.  No JVD.  CHEST:  Decreased breath sounds.  Otherwise, seems to be clear.  No rales,  rhonchi.  No egophony.  HEART:  Regular rate and rhythm.  No murmurs.  ABDOMEN:  She has colostomy.  Positive bowel sounds.  Nontender.  EXTREMITIES:  No edema.   LABORATORY DATA:  Her I&O's is up to 400 in, 700 out.  Lab work  at this  moment reveals white blood cell count from December 18 is 11.8 and her  hemoglobin is 12.2, hematocrit 37, platelets 276,000.  Her sodium today is  133, potassium 4.2.  It has been 5.4 two days ago.  Chloride 118, CO2 10,  glucose 128, BUN 39, creatinine 1.2.  BUN and creatinine seems to be  improving.  SGPT is 52, total protein 9.8, albumin 2.2, calcium 9.3.  Magnesium 1.8, phosphorous 3.3, amylase 162, lipase 50.   ASSESSMENT:  1.  Low CO2.  At this moment, not sure whether this is secondary to      metabolic acidosis or secondary to respiratory alkalosis.  There is no      ABG.  Her CO2 seems to be declining.  2.  History of acute renal failure.  BUN and creatinine seems to be      improving.  Conditionally, her creatinine has gone up probably secondary      to dehydration.  3.  History of hypertension.  Blood pressure seems to be controlled very      well.  4.  Malnutrition.  She is on TPN.  Her albumin is 2.2.  That also seems to      be declining initially.  Has improved from 2 to 2.7, but since she came      it has gone down.  Presently, the patient started on TPN.   5.  Diabetes.  She is on insulin.  6.  Status post colostomy placement.  7.  History of thrombocytopenia, that is during her last admission.      Presently, her platelets have improved.   RECOMMENDATIONS:  Will do ABG.  Will do urine sodium, urine chloride and  also will do a UA.  Will continue with present TPN.  However, if this is  associated with metabolic acidosis, probably we may increase the acetate in  her TPN or probably may need to add sodium bicarbonate orally.  Will  continue her other treatment, and will follow the patient with you.      Alison Murray, M.D.  Electronically Signed     BB/MEDQ  D:  11/24/2005  T:  11/24/2005  Job:  [id number]

## 2011-04-21 NOTE — Procedures (Signed)
NAMEMALVINA, [name]             ACCOUNT NO.:  [id number]   MEDICAL RECORD NO.:  [id number]          PATIENT TYPE:  INP   LOCATION:  A301                          FACILITY:  [facility]   PHYSICIAN:  Scarlett Presto, M.D.   DATE OF BIRTH:  November 11, 1926   DATE OF PROCEDURE:  12/19/2005  DATE OF DISCHARGE:                                  ECHOCARDIOGRAM   PRIMARY CARE PHYSICIAN:  Dr. Iona Beard.   HISTORY OF PRESENT ILLNESS:  This is a 77 year old woman with congestive  heart failure. Technical quality of the study is adequate.   M-MODE TRACING:  The aorta is 29 mm.   The left atrium is 35 mm.   The septum is 13 mm.   Posterior wall is 13 mm.   Left ventricular diastolic dimension is 47 mm.   Left ventricular systolic dimension is 32 mm.   TWO-DIMENSIONAL AND DOPPLER IMAGING:  The left ventricle is normal size.  There is preserved LV systolic function with an estimated ejection fraction  of 55 to 60%. There was no wall motion abnormalities seen. There is mild  concentric left ventricular hypertrophy.   Right ventricle is normal size.   The atria both appear to be normal size.   The aortic valve is sclerotic with no stenosis or regurgitation.   The mitral valve has moderate annular calcification with mild regurgitation.   There is mild tricuspid regurgitation.   There is no pericardial effusion.   No significant change from echocardiogram from October 2006.      Scarlett Presto, M.D.  Electronically Signed     JH/MEDQ  D:  12/19/2005  T:  12/20/2005  Job:  [id number]

## 2011-04-21 NOTE — H&P (Signed)
NAME:  [name], [name]                       ACCOUNT NO.:  [id number]   MEDICAL RECORD NO.:  [id number]                   PATIENT TYPE:  INP   LOCATION:  A331                                 FACILITY:  [facility]   PHYSICIAN:  Barrie Folk. [name], M.D.                DATE OF BIRTH:  1926/04/08   DATE OF ADMISSION:  03/10/2003  DATE OF DISCHARGE:                                HISTORY & PHYSICAL   HISTORY OF PRESENT ILLNESS:  The patient is a 77 year old married gravida 1,  para 1, Ab2, black female from [place], [place].  Chief complaint  is pelvic pain secondary to falling down steps on the day of admission.  The  patient did not lose consciousness.  Because of increasing severity of pain  in pelvic area after fall, the patient came to the emergency room for  evaluation.  X-ray in the emergency room at [facility] demonstrated  a fracture involving the left superior and inferior pubic rami and likely  the left pubic body.  Radiology indicated that usually there should be  another fracture present to complete the ring fracture of the pelvis,  questionable occult sacral fracture.  History is negative for laceration,  abrasion, contusion or nodule.  Medical history is positive for  hypertension, osteoarthritis, hyperlipidemia and diverticular disease.  Medical history is negative for tuberculosis, cancer, sickle cell, asthma  and seizure disorder.   MEDICATIONS:  Prescribed medications are:  1. Celebrex 200 mg p.o. b.i.d.  2. Verapamil 240 mg p.o. daily and 120 mg p.o. in the evening.  3. Benicar 40 mg p.o. daily.   ALLERGIES:  The patient is not allergic to any known medication.   PAST MEDICAL HISTORY:  Past medical history is positive for hospitalization  for abdominal pain secondary to flare of dyskinesia and chronic  cholecystitis, hospitalization for tonsillectomy at age 57, appendectomy and  hysterectomy.  Past medical history is also significant for  hospitalization  for surgery of left leg by Dr. Epifania Gore. Nichols due to peripheral vascular  disease.   FAMILY HISTORY:  Family history revealed mother deceased at age 72 secondary  to complications of stroke; father deceased at age 52 secondary to heart  attack; one sister living, age 25, with a history of stroke; two sisters  deceased, age 62 secondary to childbirth complication and age 66 secondary  to cerebral hemorrhage; one brother deceased at age 76 secondary to a heart  attack; one daughter living at age 60 with a history of congenital  hydrocephalus and left hemiparesis.   REVIEW OF SYSTEMS:  Review of systems is positive for chronic multiple joint  pains and stiffness.  Review of systems is negative for chronic headache,  epistaxis, chest pain, shortness of breath, edema of legs, chronic cough,  weight loss, unexplained fever, night sweats and dysphagia.   PHYSICAL EXAMINATION:  VITALS:  Temperature 96.7, pulse 89, respirations 24,  blood  pressure 173/76.  GENERAL:  General appearance revealed an elderly, slightly short, slight  medium-framed, alert black female in no apparent distress.  SKIN:  Skin warm and dry.  HEENT:  Head normocephalic.  No nodule.  Ears:  Normal auricles.  External  canals patent.  Tympanic membranes pearly gray.  Eyes:  Lids negative for  ptosis.  Sclerae white.  Pupils round, equal and reactive to light.  Extraocular movements intact.  Nose negative for discharge.  Mouth:  Positive missing teeth.  Positive filling in remaining teeth.  No bleeding  gum.  Posterior pharynx benign.  NECK:  Neck negative for lymphadenopathy or thyromegaly.  LUNGS:  Clear.  HEART:  Audible S1 and S2 without murmur, regular rate and rhythm.  BREASTS:  No skin changes. No nodule on palpable.  Nipples erect without  discharge.  ABDOMEN:  Abdomen obese.  Hypoactive bowel sounds.  Positive healed old  right lower quadrant and mid-hypogastric surgical scar.  Soft, nontender  in  all four quadrants.  Left pubic ramus positive tenderness on palpation.  Right pubic ramus nontender.  PELVIC:  Deferred.  RECTAL:  Deferred.  EXTREMITIES:  No tibial edema.  Positive healed old left medial thigh  surgical scar.  Palpable dorsalis pedis bilaterally.  No decreased  temperature of feet.  No discoloration.  NEUROLOGIC:  Alert and oriented to person, place and time.  Cranial nerves  II-XII appeared intact.   LABORATORY DATA:  White count 8.4, hemoglobin 11.8, hematocrit 36.4,  platelets 290,000.  Sodium 136, potassium 3.8, chloride 105, CO2 31, glucose  120, BUN 11, creatinine 1.0, calcium 8.4.   IMPRESSION:   PRIMARY:  1. Acute fracture involving left superior and inferior pubic rami.  2. Questionable occult sacral fracture.   SECONDARY DIAGNOSES:  1. Hypertension.  2. Osteoarthritis.  3. Diverticular disease.   PLAN:  1. Duragesic transdermal patch 25 mcg every three days.  2. Colace 200 mg p.o. b.i.d.  3. Verapamil 240 mg p.o. daily.  4. Benicar 40 mg p.o. daily.  5. Diet:  Four-gram sodium.  6. Activity:  Out of bed with assistance.  7. Labs:  Urinalysis; fasting lipid profile.                                               Barrie Folk. [name], M.D.    Armanda Heritage  D:  03/10/2003  T:  03/11/2003  Job:  [id number]

## 2011-04-21 NOTE — Consult Note (Signed)
[name], [name]             ACCOUNT NO.:  [id number]   MEDICAL RECORD NO.:  [id number]          PATIENT TYPE:  INP   LOCATION:  IC09                          FACILITY:  [facility]   PHYSICIAN:  Duke Salvia, M.D.   DATE OF BIRTH:  1926/05/08   DATE OF CONSULTATION:  DATE OF DISCHARGE:                                   CONSULTATION   IMPRESSION:  1.  Acute renal failure episodes question nephrotoxin exposure versus sepsis      syndrome.  2.  Sepsis syndrome might be intra-abdominal.  Evaluation under way.  3.  Electrolyte imbalance and temperature, acute renal failure with some      hyperchloremia and metabolic acidosis.  4.  Postoperative exploratory laparotomy for colovaginal fistula secondary      to diverticular disease.   RECOMMENDATIONS:  1.  The patient does have a central line in place.  Would use this to      monitor at least a CVP.  Assist with volume assessment and resuscitative      effort.  2.  Continue broad spectrum antibiotic pending culture results.  Would      monitor vancomycin peak trough level.  Desire trough at about 15-20      range given her severity of  illness .  Continue Levaquin at q.48 h      dosing.  3.  Monitor renal function with a daily intake of Os-Cal, calcium phosphate      (with supplemental binders as needed).  Continue adjusting her hyperal      regimen.  4.  Proton pump inhibitor therapy.  Again, stress related gastrointestinal      hemorrhage attending to her acute failure episode.  5.  When instituting hyperalimentation, would go with the renal formulation      with modified protein.  Adjust electrolytes according to prevailing      labs.  Do not add potassium at this juncture.  Sodium acetate and sodium      bicarbonate as desired.  6.  Spot urine requested for urinary lytes, calculation of fractional      excretion of sodium.  7.  Large dose Lasix trialed in effort to convert over to nonoliguria.  8.  Avoidance of hypotension,  further nephrotoxins, institution of Lopressor      support as needed.  9.  For completeness given her oliguria, would obtain a renal ultrasound to      exclude that element of obstructive uropathy.  Dr. Lowanda Foster returns in      the a.m., we will make him aware of the patient's status.  10. Would check a CPK to rule out rhabdomyolysis episode.   HISTORY OF PRESENT ILLNESS:  The patient is a 76 year old African American  female with no history of diverticulitis.  She has been maintained on long-  term antibiotic therapy and has been hospitalized toward the end of August  with diverticulitis episode.   Early in September, she underwent CT scan (question contrast at hand) and  was found to show findings consistent with diverticulitis.  She has been  maintained on antibiotics.  Cultures up to this point were negative.  She  was seen again on September 22 with vaginal drainage.  She was subsequently  admitted to the hospital on September 25, showing worsening of her clinical  status with more frank fecal drainage and possible rupture at likely  colovaginal fistula.  At the time of admission, she had normal renal  function with blood urea nitrogen of 7, creatinine 1.0.   On September 26, she underwent exploratory laparotomy with drainage of a  large phlegmon.  BUN and creatinine were followed postoperatively.  Values  on September 27 showed BUN of 7 and creatinine of 1.0.  The patient received  a loading dose of 320 mg of gentamycin for the question of __________.  She  was felt to be in mild volume overload, and up to this point and was  diuresed.  On September 01, 2005, she was noted to have elevated trough of  gentamycin up to __________ and a creatinine had bumped at this point to  1.8.  Gentamycin was discontinued, and renal function followed.  She was  improving by October 5 with creatinine of 1.5.  The patient was begun on  October 4 with vancomycin 1 g q.24 h and Levaquin 750 mg q.24  h.  As stated,  by October 5, creatinine was 1.5.  On October 6, creatinine was 1.9.  Vancomycin frequency was reduced to q.48 h as was that of the Levaquin.  On  October 7, creatinine bumped up to 2.1.  She had central line placed for  total parenteral nutrition, and she was noted to have vaginal drainage.  Urine output also on the preceding days had fallen off, and she was noted to  have electrolyte imbalance with an elevated potassium, chloride and reduced  bicarbonate.  Nephrology consultation was obtained.   PAST MEDICAL HISTORY:  1.  Hypertension.  2.  Osteoarthritis.  3.  Type 2 diabetes.  4.  Dyslipidemia.  5.  Peripheral vascular disease.   PAST SURGICAL HISTORY:  1.  Cholecystectomy.  2.  Hysterectomy.  3.  Right carpal tunnel release.  4.  Tonsillectomy.  5.  Hemiarthroplasty of the left shoulder.   PHYSICAL EXAMINATION:  GENERAL APPEARANCE:  She is writhing about in back in  bed and appears to be quite agitated.  VITAL SIGNS:  Her blood pressure at this point is 168/71, heart rate 128.  HEENT:  She has dry oral mucosa membranes.  NECK:  No JVD.  LUNGS:  Acute anteriorly with diminished breath sounds at the bases.  HEART:  Tones are rapid and regular.  ABDOMEN:  Obese with some diminished bowel sounds.  Bulk dressing in place  which was dry and intact.  She has a Foley in place.  No distal edema.   LABORATORY DATA:  Arterial blood gas showed a pH of 7.411, PCO2 37, PO2 76,  bicarbonate 23.  White count was 8.4, RBCs 3.85, hemoglobin 10.1, hematocrit  30.9 with elevated RDW at 17.1, platelets 254,000.  On October 4, her white  count was 17.9, hemoglobin 11.5, hematocrit 32.7.  On October 6, her white  count was 14.8, hemoglobin 10.1, hematocrit 29.6.  Chemistries on admission  on September 25, showed a sodium of 135, potassium 3.4, chloride 105,  bicarbonate 24, glucose 198, BUN _____, creatinine 1.1, calcium 8.4, total protein 6.7, albumin reduced down to 2.8.  On  September 28, sodium 139,  potassium 4.4, chloride 117, bicarbonate 21, glucose 122, BUN and creatinine  1.4, calcium  6.8.  On September 29, BUN and creatinine 14 and 1.8.  On  September 30, BUN and creatinine were 13 and 1.6.  On October 2, BUN and  creatinine were 9 and 1.2.  On October 7, sodium 136, potassium 6.1,  chloride 114, bicarbonate 60, glucose 147, blood urea nitrogen 14,  creatinine 2.1, calcium 8.1.  Chest films on September 06, 2005, showed low  volume with bilateral air space disease and lower lung atelectasis noted.  There was severe degenerative changes at bilateral shoulders and left  humeral prosthesis present.  Question tiny bilateral pleural effusions.  On  the evening of October 7, chest film was taken post central line insertion.  She had a  left subclavian central line with venous tip in the lower SVC near the  cavoatrial junction with no acute complications.  Nasogastric tube in place  markedly suboptimal inspiration with bibasilar atelectatic change.  Cardiac  monitor at present showed a sinus tachycardia with a rate of 1.28.      Duke Salvia, M.D.  Electronically Signed     JCM/MEDQ  D:  09/10/2005  T:  09/11/2005  Job:  [id number]

## 2011-04-21 NOTE — Consult Note (Signed)
NAME:  [name], [name]                       ACCOUNT NO.:  [id number]   MEDICAL RECORD NO.:  [id number]                   PATIENT TYPE:   LOCATION:                                       FACILITY:   PHYSICIAN:  Lindaann Slough, M.D.            DATE OF BIRTH:  09-Nov-1926   DATE OF CONSULTATION:  01/15/2003  DATE OF DISCHARGE:                                   CONSULTATION   CHIEF COMPLAINT:  Neck pain, shoulder pain.   HISTORY OF PRESENT ILLNESS:  The patient reports that the prednisone effect  gradually wore off for her neck pain.  Her MRI of the C-spine did show a  broad-based disk bulge at C6-7.  She did go for physical therapy and feels  that this has helped with her shoulder pain.  She still hurts a great deal  in this area but feels she may have a little bit better range of motion.  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  This puts strain on both her neck and shoulders.  She feels that the  hydrocodone helps only a little bit.  Her weight is stable.  There has been  no URI's, fever, cough, or shortness of breath.   MEDICATIONS:  1. Hydrocodone b.i.d.  2. Multivitamin.  3. Celebrex 200 mg b.i.d.  4. Verapamil 240 mg 1/2 b.i.d.   PHYSICAL EXAMINATION:  VITAL SIGNS:  Weight 162 pounds, blood pressure  120/80, respirations 16.  GENERAL:  No distress.  LUNGS:  Clear.  HEART:  Regular.  No murmur.  EXTREMITIES:  Lower extremities:  No edema.  MUSCULOSKELETAL:  There is no active arthritis to the hands.  The shoulders  have significant decreased internal and external rotation.  She is able to  internally rotate the shoulders only to the flanks.  The neck has decreased  range of motion but was not very tender.   ASSESSMENT AND PLAN:  1. Neck pain.  I have given her a prescription for prednisone that she can     use at some point.  This will start at 60 mg for three days and decrease     to being off in 12 days.  This helped  significantly in the past and she     likely will need this at some time.  I have discussed that I will not use     this type of treatment on a regular basis.  2. Severe arthritis of the shoulders.  Short of shoulder replacement, I     doubt there is a whole lot that can be done for     this.  She has gained some slight improvement with the physical therapy     and I have encouraged her to continue doing this.   I will continue to work with the patient concerning these painful areas.  She will call for a return visit.  Lindaann Slough, M.D.    WWT/MEDQ  D:  01/15/2003  T:  01/15/2003  Job:  [id number]   cc:   Barrie Folk. [name], M.D.  [address]  [place]  [place] [address]  Fax: [phone]

## 2011-04-21 NOTE — Procedures (Signed)
NAMEBRYONY, [name]             ACCOUNT NO.:  [id number]   MEDICAL RECORD NO.:  [id number]          PATIENT TYPE:  OBV   LOCATION:  A211                          FACILITY:  [facility]   PHYSICIAN:  Edward L. Luan Pulling, M.D.DATE OF BIRTH:  1926-03-02   DATE OF PROCEDURE:  01/11/2006  DATE OF DISCHARGE:                                EKG INTERPRETATION   The computers read this as an atrial flutter.  I did not see distinct  evidence of that.  I believe it is sinus rhythm with a rate of about 100.  There is left ventricular hypertrophy.  Abnormal electrocardiogram.      Jasper Loser. Luan Pulling, M.D.  Electronically Signed     ELH/MEDQ  D:  01/12/2006  T:  01/12/2006  Job:  [id number]

## 2011-04-21 NOTE — H&P (Signed)
[name], [name]             ACCOUNT NO.:  [id number]   MEDICAL RECORD NO.:  [id number]          PATIENT TYPE:  INP   LOCATION:  A301                          FACILITY:  [facility]   PHYSICIAN:  Barrie Folk. 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  She is  elective admission for treatment of elevated white count of 16,200, on  December 18, 2005.   HISTORY OF PRESENT ILLNESS:  Today's white count was collected because of a  routine order pertaining to outpatient TPN.  History is significant for  nurse notifying doctor on call of an elevation and the presence of fungus in  blood.  History is also significant for cough x6 days.  Cough is more so  over the past three days and is also exacerbated secondary to flushing of  Port-A-Cath pertaining to TPN.  History is positive for some shortness of  breath on exertion.  She denies runny nose, diaphoretic spell, nausea,  vomiting, sore throat, syncope, dizziness, edema of legs, and chest pain.   PAST MEDICAL HISTORY:  1.  Positive for TPN since last hospitalization of November 20, 2005 at      [facility] due to severe nutritional compromise secondary to      multifactorial causes (physical debilitation secondary to severe chronic      illness and chronic depression).  2.  Positive for colostomy (on October 09, 2005 performed by Dr. Zella Richer      at [facility] due to colonic anastomotic leak.  3.  Hypertension.  4.  Type 2 diabetes mellitus.  5.  Osteoarthritis.  6.  Metabolic acidosis (chronic).  7.  Diverticulosis.  8.  Peripheral vascular disease.  9.  Negative for tuberculosis, cancer, asthma, or seizure disorder.  10. Positive for hospitalization for cholecystectomy.  11. Hysterectomy by Dr. Marnette Burgess in her 43s secondary to fibroids.  12. Pregnancy.  13. Right wrist surgery secondary to carpal tunnel syndrome.  8. Tonsillectomy at age 38 at [facility].  15. Neck surgery at [facility].  16. Postoperative infection of the right leg in February 2005 at [facility].  17. Right femoral-popliteal bypass graft in February 2005 by Dr. Zada Girt.  18. Right hemiarthroscopy of the left shoulder by Dr. Jacquelyne Balint.  19. Exploratory surgery due to colonic vaginal fistula by Dr. Mohammed Kindle.   ALLERGIES:  The patient is not allergic to any known medication.   MEDICATIONS ON ADMISSION:  1.  Diltiazem 30 mg p.o. q.6 h.  2.  Remeron 30 mg p.o. at bedtime.  3.  Cymbalta 60 mg p.o. daily.  4.  Sodium bicarbonate 650 mg p.o. b.i.d.  5.  Oxycodone/APAP 5/500 one tablet p.o. q.12 h.   HABITS:  Negative for tobacco,  ethanol, and street drugs.   REVIEW OF SYSTEMS:  Positive for increased energy, increased weight gain  secondary to TPN, and increased oral intake, chronic stiffness of both  knees, and chronic watery stool via colostomy.  Review of systems is  negative for epistaxis, bleeding, hemoptysis, nausea, and vomiting,  hematemesis, dysuria, gross hematuria, increased frequency of urination,  hematochezia, abdominal pain, dysphagia, crying spells, suicidal ideation,  etc.   PHYSICAL EXAMINATION:  VITAL SIGNS:  Temperature 97.9, pulse 112,  respirations 32, blood pressure 149/78.  GENERAL APPEARANCE:  This is an elderly, slightly small-framed, slightly  short black female in no apparent respiratory distress.  HEENT:  Head normocephalic.  Ears:  Normal auricle.  Eyes:  Lids negative  for ptosis.  Sclerae are white.  Pupils round, equal, and reactive to light.  Extraocular movements are intact.  No lacrimation or discharge.  Mouth:  Positive missing teeth.  Remaining dentition fair.  No oral lesion.  Posterior pharynx  is benign.  NECK:  Negative for lymphadenopathy or thyromegaly.  LUNGS:  Fair air movement; No distinct rales on auscultation.  HEART:  Audible S1/S2 without murmur.  Regular rhythm, rate equal 108.  ABDOMEN:  Obese, positive for old healed surgical scars.  Positive for right  upper quadrant colostomy.  Soft, nontender in all four quadrants.  No  palpable mass or organomegaly.  EXTERNAL GENITALIA:  Normal female.  RECTAL:  Deferred.  EXTREMITIES:  Right groin positive for old healed surgical scar.  Knees  positive crepitus and stiffness bilaterally with flexion and extension.  Thighs:  Medial aspect positive for old healed surgical scars bilaterally.  Tibia:  No edema bilaterally.  Feet:  Palpable dorsalis pedis bilaterally.  NEUROLOGIC:  Alert and oriented to person, place, and time.  Cranial nerves  II-XII appeared intact.   LABORATORY DATA:  White count 16,100, hemoglobin 8.7, hematocrit 26.8,  platelets 226,000.  Sodium 136, potassium 4.2, chloride 107, CO2 20, glucose  231, BUN 29, creatinine 1.2.  Alkaline phosphatase 152, SGOT 57, SGPT 75.  Total protein 6, albumin 2.2, calcium less than 4 mg/dl.  Phosphorus 4,  magnesium 1.7.   Chest x-ray demonstrated a lower inspiratory lung volume, developing mild  venous congestion, cardiomegaly.   IMPRESSION:  1.  Leukocytosis with TPN.  2.  Hypocalcemia.  3.  Chronic anemia.  4.  Early congestive heart failure by Chest X-Ray   SECONDARY DIAGNOSES:  1.  Type 2 diabetes mellitus.  2.  Hypertension.  3.  Chronic metabolic acidosis.  4.  Osteoarthritis.  5.  Chronic depression.  6.  Colostomy of right upper quadrant.  7.  Peripheral vascular disease of lower extremity.   PLAN:  1.  Blood culture.  2.  IV vancomycin x1.  3.  IV doxycycline.  4.  IV Lasix.  5.  P.o. potassium.  6.  Regular diet.  7.  Frequent Accu-Cheks.  8.  Hold TPN until evaluation of Port-A-Cath by surgeon.  9.  Resume antidepressant medication. 10.  Sodium bicarbonate 650 mg p.o. b.i.d.  11. Analgesia for pain.      Barrie Folk. [name], MD  Electronically Signed     GKH/MEDQ  D:  12/18/2005  T:  12/18/2005  Job:  [id number]

## 2011-04-21 NOTE — Consult Note (Signed)
NAME:  [name], [name] NO.:  [id number]   MEDICAL RECORD NO.:  [id number]                   PATIENT TYPE:   LOCATION:                                       FACILITY:   PHYSICIAN:  Lindaann Slough, M.D.            DATE OF BIRTH:   DATE OF CONSULTATION:  12/01/2002  DATE OF DISCHARGE:                                   CONSULTATION   CHIEF COMPLAINT:  Neck pain, shoulder pain.   HISTORY OF PRESENT ILLNESS:  [name] cervical x-rays showed significant  arthritis along with hints of moderate neuroforaminal stenosis.  She went  for an MRI to the C-spine which showed a C6-7 broad-based disk bulge and  central disk herniation.  There was mild central stenosis.  There were also  other changes at other levels.  I talked with her several times to get all  of this done.  I recently placed her on prednisone and she has only taken it  now for 3 days but says she is considerably better.  She feels that a  radiating type of pain that went down into the thumb and index finger is not  present.  She is still quite stiff with the shoulders.  Her weight is  stable.  There has been no back pain, URIs, fever, cough, visual changes,  headaches or jaw claudication.   CURRENT MEDICATIONS:  Verapamil 240 mg q.a.m., 120 mg q.p.m., Celebrex ?mg  b.i.d., multivitamin, prednisone taper, hydrocodone p.r.n.   PHYSICAL EXAMINATION:  Weight 163 pounds, blood pressure 184/90,  respirations 18.  GENERAL:  She appears well.  LUNGS:  Clear.  HEART:  Regular, no murmur.  NECK:  Negative JVD, no adenopathy.  SKIN:  Clear.  BACK:  Nontender.  MUSCULOSKELETAL EXAM:  Hands, wrists, and elbows have a good range of motion  and show no active arthritis.  Shoulders still have quite limited internal  and external rotation.  Internal rotation is only to her flanks.  The neck  has fairly good range of motion and mild tenderness.  The muscles were  tight.  Hips, knees, ankles, and feet  had a good range of motion, showed no  active arthritis.   ASSESSMENT AND PLAN:  1. Neck pain/stenosis:  She seems to be showing some immediate improvement     with the prednisone and hopefully this will be sustained.  She will     continue with the hydrocodone and Celebrex as above.  2.     Shoulder arthritis:  She has severe limitations to the glenohumeral joints.      I am sending her to physical therapy to see if a range of motion and     strengthening will help improve her level of pain.  3. She will return in about 5 weeks.  Lindaann Slough, M.D.    WWT/MEDQ  D:  12/01/2002  T:  12/01/2002  Job:  [id number]   cc:   Barrie Folk. [name], M.D.  [address]  [place]  [place] [address]  Fax: [phone]

## 2011-04-21 NOTE — Discharge Summary (Signed)
[name], [name]             ACCOUNT NO.:  [id number]   MEDICAL RECORD NO.:  [id number]          PATIENT TYPE:  INP   LOCATION:  6713                         FACILITY:  [facility]   PHYSICIAN:  [name], M.D.   DATE OF BIRTH:  [birth year]   DATE OF ADMISSION:  08/28/2005  DATE OF DISCHARGE:  11/02/2006LH                                 DISCHARGE SUMMARY   DISCHARGE DIAGNOSES:  1.  Chronic diverticulitis with phlegmon.  2.  Colovaginal fistula.  3.  Postoperative anemia.  4.  Postoperative ileus.  5.  Acute septicemia.  6.  Severe thrombocytopenia.  7.  Urinary tract infection due to monilia.  8.  Acute renal failure.  9.  Ventilator-dependent respiratory failure.  10. Acute pneumonitis, right lower lobe.  11. Protein calorie malnutrition, severe.  12. Congestive heart failure due to volume overload.  13. Hypertension.  14. Diabetes mellitus.  15. Osteoarthritis.  16. Colorectal anastomotic leak with colovaginal fistula, recurrent.  17. Severe sinus bradycardia with respiratory failure and hypotension.   PROCEDURES:  1.  Sigmoid colectomy with coloproctostomy on September 26.  2.  Left subclavian catheter for total nutritional alimentation on October      7.   HISTORY OF PRESENT ILLNESS:  This is a 77 year old female with known history  of diverticulitis who is status post hospitalization August 22, through  August 31, with acute diverticulitis.  She had evidence of changes of  significant wall thickening and pericolic inflammatory changes in the distal  sigmoid colon consistent with diverticulitis.  She was treated x5 days and  followup CT showed increasing fluid collection.  This was subsequently  drained percutaneously and it only showed serous fluid.  There was no growth  from the culture.  The patient represented on September 22, with drainage  through her vagina.  On followup in the office, she was noted to have fecal  drainage compatible with a colovaginal  fistula.  She remained afebrile, but  her fistula did not improve.  She became progressively weaker.  She was  complaining of tenderness in her right lower quadrant and suprapubic area.  White count was 9000 and sedimentation rate 56.  CT scan showed persistent  diverticulitis with thickening of sigmoid colon.  Because of her failure to  improve and because of the fecal fistula, it was felt that it was necessary  that the patient have elective excision since she had been treated over a  month with oral and IV antibiotics.  It was felt that more than likely she  did not have an abscess and that she should be a candidate for possible  reanastomosis.  Other history is given in the admission note.  Pelvic exam  revealed a tender vaginal apex with granulation tissue at the vaginal apex  with fecal drainage in the vaginal vault.  The actual opening could not be  seen in.  The patient was scheduled for exploratory laparotomy with  resection of the involved colon and an attempt at reanastomosis.   HOSPITAL COURSE:  The patient was admitted.  She underwent limited bowel  prep.  Intraoperatively, she  was found to have a large phlegmon of the  sigmoid colon with dense adhesions to the posterior aspect of the bladder.  The connection between the vagina and the colon was not adequately  visualized.  There was no gross purulence and beneath the peritoneal  reflection, the rectum was not involved at all.  She therefore hand a hand  sewn two layered anastomosis performed between the rectum and the mid  descending colon.  This was performed without tension.  The patient had drop  in hemoglobin postoperatively and required transfusion on the first  postoperative day.  She initially was treated with gentamicin, but she had a  rise in BUN and creatinine so the gentamicin was discontinued after  creatinine increased to 1.8.  The patient started having bowel movements on  postop day #2 and had good return of  intestinal function. Initial  antibiotics were Zosyn and Flagyl after the gentamicin was discontinued  that.  By October 1, she was tolerating a diet and nasogastric tube was  discontinued.  Her major difficulty was vaginal bleeding.  It was felt that  she had a draining pelvic hematoma, although there was no blood in the JP  drain.  She had an early leukocytosis which started to resolve about postop  day #7.  The patient had failure to thrive at the early postoperative  period.  She did not tolerate her feedings after about the third day and had  episodes of nausea and vomiting.  She could not tolerate any p.o. fluids.  Because of decreased p.o. fluids on October 7, the patient had left  subclavian catheter placed for TNA because of poor nutrition.  It appeared  that by October 8, she was had developed an acute pneumonitis.  She was  transferred to the ICU at that time.  It was also noted during this time  that her BUN and creatinine was increasing.  Nephrology was consulted.  It  was their impression that she was developing acute renal failure probably  nonoliguric.  At the time that they saw her, BUN of 39 with creatinine of  3.3.  The patient had severe protein calorie malnutrition with low albumin  and prealbumin. Because of need for better antibiotic coverage, she was  switched to Tygacil.  After switching to Tygacil, she seemed to be  responding very nicely from infectious process.  She, however, was very slow  to respond to the TNA and after 4 days of TNA, her albumin was still 1.4 and  prealbumin was 4.  It was noted around October 11, that her platelets had  dropped from 300,000 to 200,000 and her platelets continued to drop.  On  October 11, she developed acute respiratory failure and required ventilatory  support and dopamine.   After ventilatory support was instituted, the patient appeared to be more stable hemodynamically.  It was noted, however, that her renal status was   worsening and a BUN had increased to 70 with a creatinine of 3.9.  Over the  next few days, the patient was started on a weaning process from the  ventilator.  By this time, her renal failure had been converted from an  oliguric renal failure to a high output renal failure.  Her right lower lobe  infiltrative process in her lung was clearing.  Her platelets, however, were  rapidly decreasing and by October 17, platelet count was down to 30.  BUN  had increased to 96 and creatinine was about 3.9.  BUN  maximized at 102 and  then started to decrease.  Platelets dropped down to a maximum of 6000 at  which time she was transfused two platelet packs and platelet count  increased to above 100,000 stabilized at about 50,000 post transfusion.  Her  white count, which had been up to 30,000, had  dropped to 13,000.  At this  time, all antibiotics were discontinued to see if antibiotics were indeed  contributing to her thrombocytopenia.  Even after 14 days of TPN, the  patient's prealbumin was still only 4.2 and there was some major concern  because of her profound protein calorie malnutrition.   She was continued on supportive care and with all antibiotics discontinued.  The patient's platelets started to increase and gradually increased to  normal.  She had an acute episode of respiratory failure due to volume  overload on October 27.  This was treated with ventilatory support.  She  responded again appropriately.  She had some rise in her cardiac enzymes and  was seen by cardiology.  It was felt that she had significant bradycardia  which was probably related to her respiratory difficulty.  It was felt that  her rise in her troponin and CK-MB were due to renal failure and her acute  pulmonary process rather than an acute myocardial injury.  Cardiology  suggested to the family that the patient would be better served if she was  treated at [facility].  We did not disagree with their   recommendation since the patient was critically ill and even though she was  showing evidence of improvement it was felt that she could be better served  with a multidisciplinary approach.  She did not have further episodes of  bradycardia.  We were able to carry her through the weaning process and  prior to discharge, she was extubated and was breathing unassisted.  The  major problem was that she did have a small rectovaginal fistula which  probably was an anastomotic leak, but this appeared to be well-controlled.  It was felt that it would be better to allow this to heal since she was on  TPN.  We could by more time with prolonged TPN and we could withhold  feedings and would not have to do another operation during the time that she  was in severe catabolic state.  Arrangements were made for her to be  transferred to the intensive care unit at [facility].  She was stable, but still critically ill at the time of transfer on November 2.  At  the time of transfer, her white count was 14,100, hemoglobin of 9.0,  hematocrit 29.3, platelets were 254,000.  Albumin had started to rise at  1.9.  Creatinine was 1.4, BUN 54 and potassium was 3.8.  Her respiratory  status was improved, febrile course was improved and her renal function  stable.      [name], M.D.  Electronically Signed     LCS/MEDQ  D:  11/12/2005  T:  11/13/2005  Job:  [id number]

## 2011-07-24 ENCOUNTER — Other Ambulatory Visit: Payer: Self-pay | Admitting: *Deleted

## 2011-07-24 DIAGNOSIS — R52 Pain, unspecified: Secondary | ICD-10-CM

## 2011-07-24 MED ORDER — HYDROCODONE-ACETAMINOPHEN 7.5-325 MG PO TABS: 1.0000 | ORAL_TABLET | Freq: Four times a day (QID) | ORAL | Status: DC | PRN

## 2011-08-24 LAB — DIFFERENTIAL
Basophils Absolute: 0
Basophils Relative: 0
Eosinophils Absolute: 0.1
Eosinophils Relative: 1
Lymphocytes Relative: 37
Lymphs Abs: 2.5
Monocytes Absolute: 1
Monocytes Relative: 15 — ABNORMAL HIGH
Neutro Abs: 3.2
Neutrophils Relative %: 47

## 2011-08-24 LAB — LIPASE, BLOOD: Lipase: 26

## 2011-08-24 LAB — CBC
HCT: 39.4
Hemoglobin: 12.7
MCHC: 32.3
MCV: 82.7
Platelets: 202
RBC: 4.76
RDW: 15.5
WBC: 6.8

## 2011-08-24 LAB — COMPREHENSIVE METABOLIC PANEL
ALT: 11
AST: 15
Albumin: 3.6
Alkaline Phosphatase: 86
BUN: 16
CO2: 23
Calcium: 9
Chloride: 104
Creatinine, Ser: 1.2
GFR calc Af Amer: 52 — ABNORMAL LOW
GFR calc non Af Amer: 43 — ABNORMAL LOW
Glucose, Bld: 95
Potassium: 4
Sodium: 140
Total Bilirubin: 0.5
Total Protein: 7

## 2011-09-07 ENCOUNTER — Other Ambulatory Visit: Payer: Self-pay | Admitting: *Deleted

## 2011-09-07 DIAGNOSIS — R52 Pain, unspecified: Secondary | ICD-10-CM

## 2011-09-07 MED ORDER — HYDROCODONE-ACETAMINOPHEN 7.5-325 MG PO TABS: 1.0000 | ORAL_TABLET | Freq: Four times a day (QID) | ORAL | Status: DC | PRN

## 2012-01-24 ENCOUNTER — Other Ambulatory Visit (HOSPITAL_COMMUNITY): Payer: Self-pay | Admitting: Family Medicine

## 2012-01-24 DIAGNOSIS — I739 Peripheral vascular disease, unspecified: Secondary | ICD-10-CM

## 2012-01-24 DIAGNOSIS — R209 Unspecified disturbances of skin sensation: Secondary | ICD-10-CM

## 2012-01-26 ENCOUNTER — Ambulatory Visit (HOSPITAL_COMMUNITY)
Admission: RE | Admit: 2012-01-26 | Discharge: 2012-01-26 | Disposition: A | Discharge status: Home / Self Care | Payer: PRIVATE HEALTH INSURANCE | Source: Ambulatory Visit | Attending: Family Medicine | Admitting: Family Medicine

## 2012-01-26 DIAGNOSIS — R209 Unspecified disturbances of skin sensation: Secondary | ICD-10-CM

## 2012-01-26 DIAGNOSIS — I739 Peripheral vascular disease, unspecified: Secondary | ICD-10-CM | POA: Insufficient documentation

## 2012-01-26 DIAGNOSIS — R238 Other skin changes: Secondary | ICD-10-CM | POA: Insufficient documentation

## 2012-02-06 ENCOUNTER — Other Ambulatory Visit: Payer: Self-pay | Admitting: *Deleted

## 2012-02-06 DIAGNOSIS — R52 Pain, unspecified: Secondary | ICD-10-CM

## 2012-02-06 MED ORDER — HYDROCODONE-ACETAMINOPHEN 7.5-325 MG PO TABS: 1.0000 | ORAL_TABLET | Freq: Four times a day (QID) | ORAL | Status: AC | PRN

## 2012-02-27 ENCOUNTER — Ambulatory Visit (INDEPENDENT_AMBULATORY_CARE_PROVIDER_SITE_OTHER): Payer: PRIVATE HEALTH INSURANCE | Admitting: General Surgery

## 2012-02-27 ENCOUNTER — Encounter (INDEPENDENT_AMBULATORY_CARE_PROVIDER_SITE_OTHER): Payer: Self-pay | Admitting: General Surgery

## 2012-02-27 VITALS — BP 140/66 | HR 86 | Temp 97.2°F | Ht 60.0 in | Wt 137.4 lb

## 2012-02-27 DIAGNOSIS — IMO0002 Reserved for concepts with insufficient information to code with codable children: Secondary | ICD-10-CM

## 2012-02-27 DIAGNOSIS — K9400 Colostomy complication, unspecified: Secondary | ICD-10-CM | POA: Insufficient documentation

## 2012-02-27 NOTE — Progress Notes (Signed)
Patient ID: [name], female   DOB: 1926/07/31, 78 y.o.   MRN: [id number]  Chief Complaint  Patient presents with  . Pre-op Exam    colostomy reversal    HPI [name] is a 78 y.o. female.   HPI  She is an established patient of ours. She had an emergency transverse loop colostomy following a sigmoid colectomy did that ended up having an anastomotic leak.  She was very sick at that time. This was back in September 2006. In 2007, a Gastrografin enema was performed and the fistula remained.  The main reason she is here today is because she has some intermittent bleeding around the left-sided colostomy when she changes the appliance. She otherwise is doing well.  Past Medical History  Diagnosis Date  . HTN (hypertension)   . Rotator cuff insufficiency 03/23/2011  . Arthritis   . Hyperlipidemia   . Thyroid disease     Past Surgical History  Procedure Date  . Vesicovaginal fistula closure w/ tah   . Carpal tunnel release     right side  . Tonsillectomy   . Cholecystectomy   . Left shoulder hemiarthroplasty   . Appendectomy   . Grafts of bilateral lower extremities   . Colon surgery     sigmoid colectomy complicated by anastomotic leak  . Colostomy transverse loop, 2006    Family History  Problem Relation Age of Onset  . Stroke Mother   . Heart disease Father     Social History History  Substance Use Topics  . Smoking status: Never Smoker   . Smokeless tobacco: Not on file  . Alcohol Use: No    No Known Allergies  Current Outpatient Prescriptions  Medication Sig Dispense Refill  . diltiazem (CARDIZEM) 30 MG tablet Take 30 mg by mouth 4 (four) times daily.        . hydrochlorothiazide 25 MG tablet Take 25 mg by mouth daily.        Marland Kitchen HYDROcodone-acetaminophen (NORCO) 7.5-325 MG per tablet       . levothyroxine (SYNTHROID, LEVOTHROID) 75 MCG tablet Take 75 mcg by mouth daily.        . nabumetone (RELAFEN) 750 MG tablet Take 750 mg by mouth 2 (two) times  daily. 1 po bid       . pioglitazone (ACTOS) 15 MG tablet Take 15 mg by mouth daily.          Review of Systems Review of Systems  Constitutional: Negative for appetite change and unexpected weight change.  Gastrointestinal: Negative for abdominal pain and abdominal distention.    Blood pressure 140/66, pulse 86, temperature 97.2 F (36.2 C), temperature source Temporal, height 5' (1.524 m), weight 137 lb 6.4 oz (62.324 kg), SpO2 98.00%.  Physical Exam Physical Exam  Constitutional: She appears well-developed and well-nourished. No distress.  Abdominal: Soft.       Lower midline scar. Right upper quadrant loop colostomy with granulation tissue around the medial area. This was treated with silver nitrate.    Data Reviewed Old chart.  Assessment    Intermittent bleeding from granulation tissue around colostomy site. This was treated with silver nitrate today. She is handling the colostomy well. We briefly discussed colostomy closure but at her age and with the possibility of having a persistent rectovaginal fistula I did not advise this.    Plan    I assured her that this was not uncommon and told her to continue using the same appliance. Return visit  prn.       Ivi Griffith J 02/27/2012, 3:16 PM

## 2012-02-27 NOTE — Patient Instructions (Signed)
Continue using current colostomy bags as you are.

## 2012-03-19 ENCOUNTER — Encounter: Payer: Self-pay | Admitting: Vascular Surgery

## 2012-03-21 ENCOUNTER — Ambulatory Visit: Payer: PRIVATE HEALTH INSURANCE | Admitting: Vascular Surgery

## 2012-03-21 ENCOUNTER — Ambulatory Visit (INDEPENDENT_AMBULATORY_CARE_PROVIDER_SITE_OTHER): Payer: PRIVATE HEALTH INSURANCE | Admitting: Vascular Surgery

## 2012-03-21 ENCOUNTER — Encounter: Payer: Self-pay | Admitting: Vascular Surgery

## 2012-03-21 VITALS — BP 147/77 | HR 52 | Temp 97.7°F | Ht 60.0 in | Wt 139.4 lb

## 2012-03-21 DIAGNOSIS — Z48812 Encounter for surgical aftercare following surgery on the circulatory system: Secondary | ICD-10-CM

## 2012-03-21 DIAGNOSIS — I739 Peripheral vascular disease, unspecified: Secondary | ICD-10-CM

## 2012-03-21 DIAGNOSIS — I70219 Atherosclerosis of native arteries of extremities with intermittent claudication, unspecified extremity: Secondary | ICD-10-CM

## 2012-03-21 NOTE — Progress Notes (Signed)
[facility] [place] HISTORY AND PHYSICAL   History of Present Illness:  Patient is a 78 y.o. year old female who presents for follow-up evaluation for PAD.  She is on Plavix/Aspirin for antiplatelet therapy. Their atherosclerotic risk factors remain elevated cholesterol, hypertension.  These are all currently stable and followed by her primary care physician.  The patient currently has no claudication symptoms.  The patient denies rest pain or ulcers on the feet.  She is referred by Dr. Criss Rosales for further evaluation D. to a sensation of cold feet recently.  The patient has previously undergone multiple revascularizations of both lower extremities. She'll left femoropopliteal bypass done by Dr. Nils Pyle several years ago. She underwent thrombectomy of her left external artery and redo left femoral to below-knee popliteal bypass with a vein graft in February 2010 by me. She also had an angioplasty of this in May of 2010 by Dr Trula Slade. She's also previously had a right femoropopliteal bypass by Dr. Nils Pyle. This has one-vessel runoff via anterior tibial artery on her most recent arteriogram.  She does walk but is not walking much more than around the house due to the fact she states she just gets tired.  Past Medical History  Diagnosis Date  . HTN (hypertension)   . Rotator cuff insufficiency 03/23/2011  . Arthritis   . Hyperlipidemia   . Thyroid disease     Past Surgical History  Procedure Date  . Vesicovaginal fistula closure w/ tah   . Carpal tunnel release     right side  . Tonsillectomy   . Cholecystectomy   . Left shoulder hemiarthroplasty   . Appendectomy   . Grafts of bilateral lower extremities   . Colon surgery     sigmoid colectomy complicated by anastomotic leak  . Colostomy transverse loop, 2006    Review of Systems:  Neurologic: sensation in the feet is intact Cardiac:denies shortness of breath or chest pain Pulmonary: denies cough or wheeze  Social  History History  Substance Use Topics  . Smoking status: Never Smoker   . Smokeless tobacco: Never Used  . Alcohol Use: No    Allergies  No Known Allergies   Current Outpatient Prescriptions  Medication Sig Dispense Refill  . HYDROcodone-acetaminophen (NORCO) 7.5-325 MG per tablet       . levothyroxine (SYNTHROID, LEVOTHROID) 75 MCG tablet Take 75 mcg by mouth daily.        . Multiple Vitamins-Minerals (MULTIVITAMIN WITH MINERALS) tablet Take 1 tablet by mouth daily.      . VOLTAREN 1 % GEL Apply topically 2 (two) times daily.      Marland Kitchen diltiazem (CARDIZEM) 30 MG tablet Take 30 mg by mouth 4 (four) times daily.        . hydrochlorothiazide 25 MG tablet Take 25 mg by mouth daily.        . nabumetone (RELAFEN) 750 MG tablet Take 750 mg by mouth 2 (two) times daily. 1 po bid       . pioglitazone (ACTOS) 15 MG tablet Take 15 mg by mouth daily.          Physical Examination  Filed Vitals:   03/21/12 1449  BP: 147/77  Pulse: 52  Temp: 97.7 F (36.5 C)  TempSrc: [place]  Height: 5' (1.524 m)  Weight: 139 lb 6.4 oz (63.231 kg)  SpO2: 100%    Body mass index is 27.22 kg/(m^2).  General:  Alert and oriented, no acute distress HEENT: Normal Neck: No bruit or  JVD Pulmonary: Clear to auscultation bilaterally Cardiac: Regular Rate and Rhythm without murmur Neurologic: Upper and lower extremity motor 5/5 and symmetric Extremities:  2+ femoral popliteal pulses  absent pedal pulses bilaterally Skin: no ulcer or rash  DATA: She had bilateral graft duplex scans today which showed that both bypasses are patent. I reviewed and interpreted her study. Additionally I reviewed and ultrasound performed in radiology dated 01/26/2012. This showed an ABI on the right 0.88 left 0.89 with triphasic waveforms bilaterally. This is unchanged from one year ago   ASSESSMENT: Patent bilateral lower extremity bypass grafts   PLAN:  Cool sensation in feet is most likely secondary to cardiac or vasomotor  tone. No symptoms of claudication rest pain or nonhealing wounds.  She has well-perfused feet with patent bypass at this time. She will have a followup graft duplex scan in 1 year. We will also repeat her ABIs at that time.  Ruta Hinds, MD [facility] [place] Office: [phone] Pager: [phone]

## 2012-03-29 NOTE — Procedures (Unsigned)
BYPASS GRAFT EVALUATION  INDICATION:  Peripheral vascular disease.  HISTORY: Diabetes:  Yes. Cardiac:  No. Hypertension:  Yes. Smoking:  No. Previous Surgery:  Left iliac thrombectomy and femoral-to-popliteal artery bypass graft on 01/07/2009; right femoral-to-popliteal artery bypass graft in 2005.  SINGLE LEVEL ARTERIAL EXAM                              RIGHT              LEFT Brachial: Anterior tibial: Posterior tibial: Peroneal: Ankle/brachial index:  PREVIOUS ABI:  Date: 12/15/2011  RIGHT:  0.88  LEFT:  0.80  Previous ABI at [facility] 01/26/2012:  RIGHT:  0.88  LEFT:  0.89  LOWER EXTREMITY BYPASS GRAFT DUPLEX EXAM:  DUPLEX: 1. Elevated velocities present involving the bilateral common femoral     and distal external iliac arteries suggestive of stenosis in the 50-     75% range. 2. Bilateral profunda femoris artery stenosis estimated in the 30-49%     range. 3. Right anterior tibial artery stenosis in the 50-75% range and left     anterior tibial artery disease in the 30-49% range.  IMPRESSION: 1. Patent bilateral femoral-to-popliteal artery bypass grafts with     elevated velocity at the left proximal anastomosis suggesting     stenosis. 2. Native artery stenosis present as noted above. 3. Distal artery disease present as noted above. 4. Ankle brachial indices performed previously are as shown above.  ___________________________________________ [name], MD  SH/MEDQ  D:  03/21/2012  T:  03/21/2012  Job:  [id number]

## 2012-04-03 ENCOUNTER — Encounter (INDEPENDENT_AMBULATORY_CARE_PROVIDER_SITE_OTHER): Payer: Self-pay

## 2012-05-29 ENCOUNTER — Ambulatory Visit (INDEPENDENT_AMBULATORY_CARE_PROVIDER_SITE_OTHER): Payer: PRIVATE HEALTH INSURANCE | Admitting: Orthopedic Surgery

## 2012-05-29 ENCOUNTER — Encounter: Payer: Self-pay | Admitting: Orthopedic Surgery

## 2012-05-29 ENCOUNTER — Ambulatory Visit (INDEPENDENT_AMBULATORY_CARE_PROVIDER_SITE_OTHER): Payer: PRIVATE HEALTH INSURANCE

## 2012-05-29 VITALS — BP 120/70 | Ht 60.0 in | Wt 140.0 lb

## 2012-05-29 DIAGNOSIS — M542 Cervicalgia: Secondary | ICD-10-CM | POA: Insufficient documentation

## 2012-05-29 DIAGNOSIS — M4802 Spinal stenosis, cervical region: Secondary | ICD-10-CM | POA: Insufficient documentation

## 2012-05-29 MED ORDER — GABAPENTIN 100 MG PO CAPS: 100.0000 mg | ORAL_CAPSULE | Freq: Three times a day (TID) | ORAL | Status: DC

## 2012-05-29 MED ORDER — HYDROCODONE-ACETAMINOPHEN 7.5-325 MG PO TABS: 1.0000 | ORAL_TABLET | Freq: Four times a day (QID) | ORAL | Status: DC | PRN

## 2012-05-29 NOTE — Progress Notes (Signed)
Subjective:     Patient ID: [name], female   DOB: November 20, 1926, 78 y.o.   MRN: [id number]  Chief Complaint  Patient presents with  . Follow-up    recheck bilateral shoulders    HPI Status is LEFT shoulder hemiarthroplasty, continues to complain of bilateral shoulder pain. Denies neck pain, but does complain of some neck stiffness and pain radiating into both upper arms above the elbow.  Currently on hydrocodone 7.5 mg no longer controlling her pain  Review of Systems She denies any numbness or tingling in the upper extremities    Objective:   Physical Exam Elevation of the RIGHT 120, mild discomfort, LEFT shoulder forward elevation, 80.  Cervical spine range of motion is decreased on rotation, as well as flexion, extension, there is tenderness at the base of the cervical spine. No detectable weakness.  Reflexes are normal     Assessment:     Xray c spine shows Uncovertebral joints the stenotic areas, and osteophytes    Plan:     MRI c spine Open unit called with results start Neurontin 100 mg t.i.d., refill. Hydrocodone 7.5 q.6

## 2012-05-29 NOTE — Patient Instructions (Addendum)
You have been scheduled for an MRI scan.  Your insurance company requires advocate precertification prior to scheduling the MRI.  If her MRI scan is not improved we will let you know and make further treatment recommendations according to your insurance's guidelines.   We will call you with the results  This will be done at an open unit

## 2012-06-10 ENCOUNTER — Telehealth: Payer: Self-pay | Admitting: Radiology

## 2012-06-10 NOTE — Telephone Encounter (Signed)
I called to give the patient her MRI appointment on 06-15-12 at [place] at 3:15. Patient has [facility], authorization # [id number] and it expires on 07-15-12. Dr. Aline Brochure will call her with her results.

## 2012-06-15 ENCOUNTER — Ambulatory Visit
Admission: RE | Admit: 2012-06-15 | Discharge: 2012-06-15 | Disposition: A | Discharge status: Home / Self Care | Payer: PRIVATE HEALTH INSURANCE | Source: Ambulatory Visit | Attending: Orthopedic Surgery | Admitting: Orthopedic Surgery

## 2012-06-15 DIAGNOSIS — M4802 Spinal stenosis, cervical region: Secondary | ICD-10-CM

## 2012-06-24 ENCOUNTER — Telehealth: Payer: Self-pay | Admitting: Orthopedic Surgery

## 2012-06-24 NOTE — Telephone Encounter (Signed)
Patient called, left voice message, asking for results of MRI, which she had on Saturday 06/15/12; patient's phone # is [phone] (Home).

## 2012-08-06 ENCOUNTER — Other Ambulatory Visit: Payer: Self-pay | Admitting: *Deleted

## 2012-08-06 DIAGNOSIS — M542 Cervicalgia: Secondary | ICD-10-CM

## 2012-08-06 DIAGNOSIS — M4802 Spinal stenosis, cervical region: Secondary | ICD-10-CM

## 2012-08-06 MED ORDER — HYDROCODONE-ACETAMINOPHEN 7.5-325 MG PO TABS: 1.0000 | ORAL_TABLET | Freq: Four times a day (QID) | ORAL | Status: DC | PRN

## 2012-09-25 ENCOUNTER — Other Ambulatory Visit: Payer: Self-pay | Admitting: Orthopaedic Surgery

## 2012-09-25 DIAGNOSIS — M25511 Pain in right shoulder: Secondary | ICD-10-CM

## 2012-10-16 ENCOUNTER — Ambulatory Visit
Admission: RE | Admit: 2012-10-16 | Discharge: 2012-10-16 | Disposition: A | Discharge status: Home / Self Care | Payer: PRIVATE HEALTH INSURANCE | Source: Ambulatory Visit | Attending: Orthopaedic Surgery | Admitting: Orthopaedic Surgery

## 2012-10-16 DIAGNOSIS — M25511 Pain in right shoulder: Secondary | ICD-10-CM

## 2013-01-21 ENCOUNTER — Other Ambulatory Visit: Payer: Self-pay | Admitting: *Deleted

## 2013-01-21 DIAGNOSIS — M542 Cervicalgia: Secondary | ICD-10-CM

## 2013-01-21 DIAGNOSIS — M4802 Spinal stenosis, cervical region: Secondary | ICD-10-CM

## 2013-01-21 MED ORDER — HYDROCODONE-ACETAMINOPHEN 7.5-325 MG PO TABS: 1.0000 | ORAL_TABLET | Freq: Four times a day (QID) | ORAL | Status: DC | PRN

## 2013-03-05 ENCOUNTER — Other Ambulatory Visit: Payer: Self-pay | Admitting: *Deleted

## 2013-03-05 DIAGNOSIS — Z48812 Encounter for surgical aftercare following surgery on the circulatory system: Secondary | ICD-10-CM

## 2013-03-05 DIAGNOSIS — I739 Peripheral vascular disease, unspecified: Secondary | ICD-10-CM

## 2013-03-06 ENCOUNTER — Ambulatory Visit: Payer: PRIVATE HEALTH INSURANCE | Admitting: Neurosurgery

## 2013-03-06 ENCOUNTER — Encounter (INDEPENDENT_AMBULATORY_CARE_PROVIDER_SITE_OTHER): Payer: PRIVATE HEALTH INSURANCE | Admitting: *Deleted

## 2013-03-06 DIAGNOSIS — Z48812 Encounter for surgical aftercare following surgery on the circulatory system: Secondary | ICD-10-CM

## 2013-03-06 DIAGNOSIS — I739 Peripheral vascular disease, unspecified: Secondary | ICD-10-CM

## 2013-03-07 ENCOUNTER — Other Ambulatory Visit: Payer: Self-pay

## 2013-03-07 DIAGNOSIS — Z48812 Encounter for surgical aftercare following surgery on the circulatory system: Secondary | ICD-10-CM

## 2013-03-07 DIAGNOSIS — I739 Peripheral vascular disease, unspecified: Secondary | ICD-10-CM

## 2013-03-10 ENCOUNTER — Other Ambulatory Visit: Payer: Self-pay | Admitting: *Deleted

## 2013-03-10 DIAGNOSIS — M4802 Spinal stenosis, cervical region: Secondary | ICD-10-CM

## 2013-03-10 DIAGNOSIS — M542 Cervicalgia: Secondary | ICD-10-CM

## 2013-03-10 MED ORDER — GABAPENTIN 100 MG PO CAPS: 100.0000 mg | ORAL_CAPSULE | Freq: Three times a day (TID) | ORAL | Status: DC

## 2013-03-11 ENCOUNTER — Encounter: Payer: Self-pay | Admitting: Vascular Surgery

## 2013-05-21 ENCOUNTER — Encounter (HOSPITAL_COMMUNITY): Payer: Self-pay | Admitting: Pharmacy Technician

## 2013-05-30 ENCOUNTER — Encounter (HOSPITAL_COMMUNITY)
Admission: RE | Admit: 2013-05-30 | Discharge: 2013-05-30 | Disposition: A | Discharge status: Home / Self Care | Payer: PRIVATE HEALTH INSURANCE | Source: Ambulatory Visit | Attending: Ophthalmology | Admitting: Ophthalmology

## 2013-05-30 ENCOUNTER — Encounter (HOSPITAL_COMMUNITY): Payer: Self-pay

## 2013-05-30 HISTORY — DX: Hypothyroidism, unspecified: E03.9

## 2013-05-30 LAB — HEMOGLOBIN AND HEMATOCRIT, BLOOD
HCT: 32.8 % — ABNORMAL LOW (ref 36.0–46.0)
Hemoglobin: 10.9 g/dL — ABNORMAL LOW (ref 12.0–15.0)

## 2013-05-30 LAB — BASIC METABOLIC PANEL
BUN: 20 mg/dL (ref 6–23)
CO2: 25 mEq/L (ref 19–32)
Calcium: 9.5 mg/dL (ref 8.4–10.5)
Chloride: 105 mEq/L (ref 96–112)
Creatinine, Ser: 1.79 mg/dL — ABNORMAL HIGH (ref 0.50–1.10)
GFR calc Af Amer: 29 mL/min — ABNORMAL LOW (ref >90)
GFR calc non Af Amer: 25 mL/min — ABNORMAL LOW (ref >90)
Glucose, Bld: 91 mg/dL (ref 70–99)
Potassium: 4.9 mEq/L (ref 3.5–5.1)
Sodium: 140 mEq/L (ref 135–145)

## 2013-05-30 LAB — SURGICAL PCR SCREEN
MRSA, PCR: NEGATIVE
Staphylococcus aureus: NEGATIVE

## 2013-05-30 NOTE — Patient Instructions (Signed)
Your procedure is scheduled on:  06/05/13  Report to [facility] at 10:40 AM.  Call this number if you have problems the morning of surgery: [phone]   Remember:   Do not eat or drink:After Midnight.  Take these medicines the morning of surgery with A SIP OF WATER: Amlodipine, Levothyroxine, Gabapentin and Hydrocodone if needed.   Do not wear jewelry, make-up or nail polish.  Do not wear lotions, powders, or perfumes. You may wear deodorant.  Do not shave 48 hours prior to surgery. Men may shave face and neck.  Do not bring valuables to the hospital.  Contacts, dentures or bridgework may not be worn into surgery.  Leave suitcase in the car. After surgery it may be brought to your room.  For patients admitted to the hospital, checkout time is 11:00 AM the day of discharge.   Patients discharged the day of surgery will not be allowed to drive home.    Special Instructions: Start using your eye drops before surgery as directed by your eye doctor.   Please read over the following fact sheets that you were given: Anesthesia Post-op Instructions    Cataract Surgery  A cataract is a clouding of the lens of the eye. When a lens becomes cloudy, vision is reduced based on the degree and nature of the clouding. Surgery may be needed to improve vision. Surgery removes the cloudy lens and usually replaces it with a substitute lens (intraocular lens, IOL). LET YOUR EYE DOCTOR KNOW ABOUT:  Allergies to food or medicine.  Medicines taken including herbs, eyedrops, over-the-counter medicines, and creams.  Use of steroids (by mouth or creams).  Previous problems with anesthetics or numbing medicine.  History of bleeding problems or blood clots.  Previous surgery.  Other health problems, including diabetes and kidney problems.  Possibility of pregnancy, if this applies. RISKS AND COMPLICATIONS  Infection.  Inflammation of the eyeball (endophthalmitis) that can spread to both eyes  (sympathetic ophthalmia).  Poor wound healing.  If an IOL is inserted, it can later fall out of proper position. This is very uncommon.  Clouding of the part of your eye that holds an IOL in place. This is called an "after-cataract." These are uncommon, but easily treated. BEFORE THE PROCEDURE  Do not eat or drink anything except small amounts of water for 8 to 12 before your surgery, or as directed by your caregiver.  Unless you are told otherwise, continue any eyedrops you have been prescribed.  Talk to your primary caregiver about all other medicines that you take (both prescription and non-prescription). In some cases, you may need to stop or change medicines near the time of your surgery. This is most important if you are taking blood-thinning medicine.Do not stop medicines unless you are told to do so.  Arrange for someone to drive you to and from the procedure.  Do not put contact lenses in either eye on the day of your surgery. PROCEDURE There is more than one method for safely removing a cataract. Your doctor can explain the differences and help determine which is best for you. Phacoemulsification surgery is the most common form of cataract surgery.  An injection is given behind the eye or eyedrops are given to make this a painless procedure.  A small cut (incision) is made on the edge of the clear, dome-shaped surface that covers the front of the eye (cornea).  A tiny probe is painlessly inserted into the eye. This device gives off ultrasound waves that  soften and break up the cloudy center of the lens. This makes it easier for the cloudy lens to be removed by suction.  An IOL may be implanted.  The normal lens of the eye is covered by a clear capsule. Part of that capsule is intentionally left in the eye to support the IOL.  Your surgeon may or may not use stitches to close the incision. There are other forms of cataract surgery that require a larger incision and stiches  to close the eye. This approach is taken in cases where the doctor feels that the cataract cannot be easily removed using phacoemulsification. AFTER THE PROCEDURE  When an IOL is implanted, it does not need care. It becomes a permanent part of your eye and cannot be seen or felt.  Your doctor will schedule follow-up exams to check on your progress.  Review your other medicines with your doctor to see which can be resumed after surgery.  Use eyedrops or take medicine as prescribed by your doctor. Document Released: 11/09/2011 Document Revised: 02/12/2012 Document Reviewed: 11/09/2011 [facility] Patient Information 2013 [place].    PATIENT INSTRUCTIONS POST-ANESTHESIA  IMMEDIATELY FOLLOWING SURGERY:  Do not drive or operate machinery for the first twenty four hours after surgery.  Do not make any important decisions for twenty four hours after surgery or while taking narcotic pain medications or sedatives.  If you develop intractable nausea and vomiting or a severe headache please notify your doctor immediately.  FOLLOW-UP:  Please make an appointment with your surgeon as instructed. You do not need to follow up with anesthesia unless specifically instructed to do so.  WOUND CARE INSTRUCTIONS (if applicable):  Keep a dry clean dressing on the anesthesia/puncture wound site if there is drainage.  Once the wound has quit draining you may leave it open to air.  Generally you should leave the bandage intact for twenty four hours unless there is drainage.  If the epidural site drains for more than 36-48 hours please call the anesthesia department.  QUESTIONS?:  Please feel free to call your physician or the hospital operator if you have any questions, and they will be happy to assist you.

## 2013-06-04 MED ORDER — NEOMYCIN-POLYMYXIN-DEXAMETH 3.5-10000-0.1 OP OINT
TOPICAL_OINTMENT | OPHTHALMIC | Status: AC
  Filled 2013-06-04: qty 3.5

## 2013-06-04 MED ORDER — LIDOCAINE HCL (PF) 1 % IJ SOLN
INTRAMUSCULAR | Status: AC
  Filled 2013-06-04: qty 2

## 2013-06-04 MED ORDER — LIDOCAINE HCL 3.5 % OP GEL
OPHTHALMIC | Status: AC
  Filled 2013-06-04: qty 5

## 2013-06-04 MED ORDER — TETRACAINE HCL 0.5 % OP SOLN
OPHTHALMIC | Status: AC
  Filled 2013-06-04: qty 2

## 2013-06-04 MED ORDER — CYCLOPENTOLATE-PHENYLEPHRINE 0.2-1 % OP SOLN
OPHTHALMIC | Status: AC
  Filled 2013-06-04: qty 2

## 2013-06-05 ENCOUNTER — Encounter (HOSPITAL_COMMUNITY): Payer: Self-pay | Admitting: *Deleted

## 2013-06-05 ENCOUNTER — Ambulatory Visit (HOSPITAL_COMMUNITY)
Admission: RE | Admit: 2013-06-05 | Discharge: 2013-06-05 | Disposition: A | Discharge status: Home / Self Care | Payer: PRIVATE HEALTH INSURANCE | Source: Ambulatory Visit | Attending: Ophthalmology | Admitting: Ophthalmology

## 2013-06-05 ENCOUNTER — Ambulatory Visit (HOSPITAL_COMMUNITY): Payer: PRIVATE HEALTH INSURANCE | Admitting: Anesthesiology

## 2013-06-05 ENCOUNTER — Encounter (HOSPITAL_COMMUNITY)
Admission: RE | Disposition: A | Discharge status: Home / Self Care | Payer: Self-pay | Source: Ambulatory Visit | Attending: Ophthalmology

## 2013-06-05 ENCOUNTER — Encounter (HOSPITAL_COMMUNITY): Payer: Self-pay | Admitting: Anesthesiology

## 2013-06-05 DIAGNOSIS — Z01810 Encounter for preprocedural cardiovascular examination: Secondary | ICD-10-CM | POA: Insufficient documentation

## 2013-06-05 DIAGNOSIS — Z01812 Encounter for preprocedural laboratory examination: Secondary | ICD-10-CM | POA: Insufficient documentation

## 2013-06-05 DIAGNOSIS — E119 Type 2 diabetes mellitus without complications: Secondary | ICD-10-CM | POA: Insufficient documentation

## 2013-06-05 DIAGNOSIS — I10 Essential (primary) hypertension: Secondary | ICD-10-CM | POA: Insufficient documentation

## 2013-06-05 DIAGNOSIS — H2510 Age-related nuclear cataract, unspecified eye: Secondary | ICD-10-CM | POA: Insufficient documentation

## 2013-06-05 HISTORY — PX: CATARACT EXTRACTION W/PHACO: SHX586

## 2013-06-05 SURGERY — PHACOEMULSIFICATION, CATARACT, WITH IOL INSERTION
Anesthesia: Monitor Anesthesia Care | Site: Eye | Laterality: Right | Wound class: Clean

## 2013-06-05 MED ORDER — CYCLOPENTOLATE-PHENYLEPHRINE 0.2-1 % OP SOLN
1.0000 [drp] | OPHTHALMIC | Status: AC
  Administered 2013-06-05 (×3): 1 [drp] via OPHTHALMIC

## 2013-06-05 MED ORDER — MIDAZOLAM HCL 2 MG/2ML IJ SOLN
INTRAMUSCULAR | Status: AC
  Filled 2013-06-05: qty 2

## 2013-06-05 MED ORDER — NEOMYCIN-POLYMYXIN-DEXAMETH 0.1 % OP OINT
TOPICAL_OINTMENT | OPHTHALMIC | Status: DC | PRN
  Administered 2013-06-05: 1 via OPHTHALMIC

## 2013-06-05 MED ORDER — EPINEPHRINE HCL 1 MG/ML IJ SOLN
INTRAOCULAR | Status: DC | PRN
  Administered 2013-06-05: 12:00:00

## 2013-06-05 MED ORDER — TETRACAINE HCL 0.5 % OP SOLN
1.0000 [drp] | OPHTHALMIC | Status: AC
  Administered 2013-06-05 (×3): 1 [drp] via OPHTHALMIC

## 2013-06-05 MED ORDER — BSS IO SOLN
INTRAOCULAR | Status: DC | PRN
  Administered 2013-06-05: 15 mL via INTRAOCULAR

## 2013-06-05 MED ORDER — POVIDONE-IODINE 5 % OP SOLN
OPHTHALMIC | Status: DC | PRN
  Administered 2013-06-05: 1 via OPHTHALMIC

## 2013-06-05 MED ORDER — LIDOCAINE HCL (PF) 1 % IJ SOLN
INTRAMUSCULAR | Status: DC | PRN
  Administered 2013-06-05: .7 mL

## 2013-06-05 MED ORDER — LACTATED RINGERS IV SOLN
INTRAVENOUS | Status: DC
  Administered 2013-06-05: 1000 mL via INTRAVENOUS

## 2013-06-05 MED ORDER — PHENYLEPHRINE HCL 2.5 % OP SOLN
1.0000 [drp] | OPHTHALMIC | Status: AC
  Administered 2013-06-05 (×3): 1 [drp] via OPHTHALMIC

## 2013-06-05 MED ORDER — PHENYLEPHRINE HCL 2.5 % OP SOLN
OPHTHALMIC | Status: AC
  Filled 2013-06-05: qty 15

## 2013-06-05 MED ORDER — PROVISC 10 MG/ML IO SOLN
INTRAOCULAR | Status: DC | PRN
  Administered 2013-06-05: 8.5 mg via INTRAOCULAR

## 2013-06-05 MED ORDER — EPINEPHRINE HCL 1 MG/ML IJ SOLN
INTRAMUSCULAR | Status: AC
  Filled 2013-06-05: qty 1

## 2013-06-05 MED ORDER — LIDOCAINE HCL 3.5 % OP GEL
1.0000 "application " | Freq: Once | OPHTHALMIC | Status: AC
  Administered 2013-06-05: 1 via OPHTHALMIC

## 2013-06-05 MED ORDER — MIDAZOLAM HCL 2 MG/2ML IJ SOLN
1.0000 mg | INTRAMUSCULAR | Status: DC | PRN
  Administered 2013-06-05: 2 mg via INTRAVENOUS

## 2013-06-05 SURGICAL SUPPLY — 32 items
CAPSULAR TENSION RING-AMO (OPHTHALMIC RELATED) IMPLANT
CLOTH BEACON ORANGE TIMEOUT ST (SAFETY) ×1 IMPLANT
EYE SHIELD UNIVERSAL CLEAR (GAUZE/BANDAGES/DRESSINGS) ×1 IMPLANT
GLOVE BIO SURGEON STRL SZ 6.5 (GLOVE) IMPLANT
GLOVE BIOGEL PI IND STRL 6.5 (GLOVE) IMPLANT
GLOVE BIOGEL PI IND STRL 7.0 (GLOVE) IMPLANT
GLOVE BIOGEL PI IND STRL 7.5 (GLOVE) IMPLANT
GLOVE BIOGEL PI INDICATOR 6.5 (GLOVE)
GLOVE BIOGEL PI INDICATOR 7.0 (GLOVE) ×2
GLOVE BIOGEL PI INDICATOR 7.5 (GLOVE)
GLOVE ECLIPSE 6.5 STRL STRAW (GLOVE) IMPLANT
GLOVE ECLIPSE 7.0 STRL STRAW (GLOVE) IMPLANT
GLOVE ECLIPSE 7.5 STRL STRAW (GLOVE) IMPLANT
GLOVE EXAM NITRILE LRG STRL (GLOVE) IMPLANT
GLOVE EXAM NITRILE MD LF STRL (GLOVE) IMPLANT
GLOVE SKINSENSE NS SZ6.5 (GLOVE)
GLOVE SKINSENSE NS SZ7.0 (GLOVE)
GLOVE SKINSENSE STRL SZ6.5 (GLOVE) IMPLANT
GLOVE SKINSENSE STRL SZ7.0 (GLOVE) IMPLANT
KIT VITRECTOMY (OPHTHALMIC RELATED) IMPLANT
PAD ARMBOARD 7.5X6 YLW CONV (MISCELLANEOUS) ×1 IMPLANT
PROC W NO LENS (INTRAOCULAR LENS)
PROC W SPEC LENS (INTRAOCULAR LENS)
PROCESS W NO LENS (INTRAOCULAR LENS) IMPLANT
PROCESS W SPEC LENS (INTRAOCULAR LENS) IMPLANT
RING MALYGIN (MISCELLANEOUS) IMPLANT
SIGHTPATH CAT PROC W REG LENS (Ophthalmic Related) ×2 IMPLANT
SYR TB 1ML LL NO SAFETY (SYRINGE) ×1 IMPLANT
TAPE SURG TRANSPORE 1 IN (GAUZE/BANDAGES/DRESSINGS) IMPLANT
TAPE SURGICAL TRANSPORE 1 IN (GAUZE/BANDAGES/DRESSINGS) ×1
VISCOELASTIC ADDITIONAL (OPHTHALMIC RELATED) IMPLANT
WATER STERILE IRR 250ML POUR (IV SOLUTION) ×1 IMPLANT

## 2013-06-05 NOTE — Op Note (Signed)
Date of Admission: 06/05/2013  Date of Surgery: 06/05/2013  Pre-Op Dx: Cataract  Right  Eye  Post-Op Dx: Combined cataract  Right  Eye,  Dx Code 366.19  Surgeon: Tonny Branch, M.D.  Assistants: None  Anesthesia: Topical with MAC  Indications: Painless, progressive loss of vision with compromise of daily activities.  Surgery: Cataract Extraction with Intraocular lens Implant Right Eye  Discription: The patient had dilating drops and viscous lidocaine placed into the left eye in the pre-op holding area. After transfer to the operating room, a time out was performed. The patient was then prepped and draped. Beginning with a 44 degree blade a paracentesis port was made at the surgeon's 2 o'clock position. The anterior chamber was then filled with 1% non-preserved lidocaine. This was followed by filling the anterior chamber with Provisc. A bent cystatome needle was used to create a continuous tear capsulotomy. Hydrodissection was performed with balanced salt solution on a Fine canula. The lens nucleus was then removed using the phacoemulsification handpiece. Residual cortex was removed with the I&A handpiece. The anterior chamber and capsular bag were refilled with Provisc. A posterior chamber intraocular lens was placed into the capsular bag with it's injector. The implant was positioned with the Kuglan hook. The Provisc was then removed from the anterior chamber and capsular bag with the I&A handpiece. Stromal hydration of the main incision and paracentesis port was performed with BSS on a Fine canula. The wounds were tested for leak which was negative. The patient tolerated the procedure well. There were no operative complications. The patient was then transferred to the recovery room in stable condition.  Complications: None  Specimen: None  EBL: None  Prosthetic device: B&L enVista, MX60, power 22.0D, [id number].

## 2013-06-05 NOTE — Anesthesia Postprocedure Evaluation (Signed)
  Anesthesia Post-op Note  Patient: [name]  Procedure(s) Performed: Procedure(s) with comments: CATARACT EXTRACTION PHACO AND INTRAOCULAR LENS PLACEMENT (IOC) (Right) - CDE: 10.58  Patient Location: PACU and Short Stay  Anesthesia Type:MAC  Level of Consciousness: awake, alert  and oriented  Airway and Oxygen Therapy: Patient Spontanous Breathing  Post-op Pain: none  Post-op Assessment: Post-op Vital signs reviewed, Patient's Cardiovascular Status Stable, Respiratory Function Stable, Patent Airway and No signs of Nausea or vomiting  Post-op Vital Signs: Reviewed  Complications: No apparent anesthesia complications

## 2013-06-05 NOTE — H&P (Signed)
I have reviewed the H&P, the patient was re-examined, and I have identified no interval changes in medical condition and plan of care since the history and physical of record  

## 2013-06-05 NOTE — Anesthesia Preprocedure Evaluation (Signed)
Anesthesia Evaluation  Patient identified by MRN, date of birth, ID band Patient awake    Reviewed: Allergy & Precautions, H&P , NPO status , Patient's Chart, lab work & pertinent test results  Airway Mallampati: II      Dental  (+) Teeth Intact   Pulmonary neg pulmonary ROS,  breath sounds clear to auscultation        Cardiovascular hypertension, Pt. on medications + Peripheral Vascular Disease Rhythm:Regular Rate:Bradycardia     Neuro/Psych    GI/Hepatic   Endo/Other  Hypothyroidism   Renal/GU      Musculoskeletal   Abdominal   Peds  Hematology   Anesthesia Other Findings   Reproductive/Obstetrics                           Anesthesia Physical Anesthesia Plan  ASA: II  Anesthesia Plan: MAC   Post-op Pain Management:    Induction: Intravenous  Airway Management Planned: Nasal Cannula  Additional Equipment:   Intra-op Plan:   Post-operative Plan:   Informed Consent: I have reviewed the patients History and Physical, chart, labs and discussed the procedure including the risks, benefits and alternatives for the proposed anesthesia with the patient or authorized representative who has indicated his/her understanding and acceptance.     Plan Discussed with:   Anesthesia Plan Comments:         Anesthesia Quick Evaluation

## 2013-06-05 NOTE — Transfer of Care (Signed)
Immediate Anesthesia Transfer of Care Note  Patient: [name]  Procedure(s) Performed: Procedure(s) with comments: CATARACT EXTRACTION PHACO AND INTRAOCULAR LENS PLACEMENT (IOC) (Right) - CDE: 10.58  Patient Location: Short Stay  Anesthesia Type:MAC  Level of Consciousness: awake, alert  and oriented  Airway & Oxygen Therapy: Patient Spontanous Breathing  Post-op Assessment: Report given to PACU RN  Post vital signs: Reviewed and stable  Complications: No apparent anesthesia complications

## 2013-06-09 ENCOUNTER — Encounter (HOSPITAL_COMMUNITY): Payer: Self-pay | Admitting: Ophthalmology

## 2013-07-01 ENCOUNTER — Encounter (HOSPITAL_COMMUNITY): Payer: Self-pay | Admitting: Pharmacy Technician

## 2013-07-02 MED ORDER — FENTANYL CITRATE 0.05 MG/ML IJ SOLN: 25.0000 ug | INTRAMUSCULAR | Status: DC | PRN

## 2013-07-02 MED ORDER — ONDANSETRON HCL 4 MG/2ML IJ SOLN: 4.0000 mg | Freq: Once | INTRAMUSCULAR | Status: AC | PRN

## 2013-07-03 ENCOUNTER — Encounter (HOSPITAL_COMMUNITY)
Admission: RE | Admit: 2013-07-03 | Discharge: 2013-07-03 | Disposition: A | Discharge status: Home / Self Care | Payer: PRIVATE HEALTH INSURANCE | Source: Ambulatory Visit | Attending: Ophthalmology | Admitting: Ophthalmology

## 2013-07-09 MED ORDER — LIDOCAINE HCL 3.5 % OP GEL
OPHTHALMIC | Status: AC
  Filled 2013-07-09: qty 5

## 2013-07-09 MED ORDER — NEOMYCIN-POLYMYXIN-DEXAMETH 3.5-10000-0.1 OP OINT
TOPICAL_OINTMENT | OPHTHALMIC | Status: AC
  Filled 2013-07-09: qty 3.5

## 2013-07-09 MED ORDER — LIDOCAINE HCL (PF) 1 % IJ SOLN
INTRAMUSCULAR | Status: AC
  Filled 2013-07-09: qty 2

## 2013-07-09 MED ORDER — TETRACAINE HCL 0.5 % OP SOLN
OPHTHALMIC | Status: AC
  Filled 2013-07-09: qty 2

## 2013-07-09 MED ORDER — PHENYLEPHRINE HCL 2.5 % OP SOLN
OPHTHALMIC | Status: AC
  Filled 2013-07-09: qty 2

## 2013-07-09 MED ORDER — CYCLOPENTOLATE-PHENYLEPHRINE 0.2-1 % OP SOLN
OPHTHALMIC | Status: AC
  Filled 2013-07-09: qty 2

## 2013-07-10 ENCOUNTER — Ambulatory Visit (HOSPITAL_COMMUNITY)
Admission: RE | Admit: 2013-07-10 | Discharge: 2013-07-10 | Disposition: A | Discharge status: Home / Self Care | Payer: PRIVATE HEALTH INSURANCE | Source: Ambulatory Visit | Attending: Ophthalmology | Admitting: Ophthalmology

## 2013-07-10 ENCOUNTER — Encounter (HOSPITAL_COMMUNITY): Payer: Self-pay | Admitting: Ophthalmology

## 2013-07-10 ENCOUNTER — Ambulatory Visit (HOSPITAL_COMMUNITY): Payer: PRIVATE HEALTH INSURANCE | Admitting: Anesthesiology

## 2013-07-10 ENCOUNTER — Encounter (HOSPITAL_COMMUNITY)
Admission: RE | Disposition: A | Discharge status: Home / Self Care | Payer: Self-pay | Source: Ambulatory Visit | Attending: Ophthalmology

## 2013-07-10 ENCOUNTER — Encounter (HOSPITAL_COMMUNITY): Payer: Self-pay | Admitting: Anesthesiology

## 2013-07-10 DIAGNOSIS — H2589 Other age-related cataract: Secondary | ICD-10-CM | POA: Insufficient documentation

## 2013-07-10 DIAGNOSIS — I10 Essential (primary) hypertension: Secondary | ICD-10-CM | POA: Insufficient documentation

## 2013-07-10 HISTORY — PX: CATARACT EXTRACTION W/PHACO: SHX586

## 2013-07-10 SURGERY — PHACOEMULSIFICATION, CATARACT, WITH IOL INSERTION
Anesthesia: Monitor Anesthesia Care | Site: Eye | Laterality: Left | Wound class: Clean

## 2013-07-10 MED ORDER — LIDOCAINE HCL 3.5 % OP GEL
1.0000 "application " | Freq: Once | OPHTHALMIC | Status: AC
  Administered 2013-07-10: 1 via OPHTHALMIC

## 2013-07-10 MED ORDER — POVIDONE-IODINE 5 % OP SOLN
OPHTHALMIC | Status: DC | PRN
  Administered 2013-07-10: 1 via OPHTHALMIC

## 2013-07-10 MED ORDER — NEOMYCIN-POLYMYXIN-DEXAMETH 0.1 % OP OINT
TOPICAL_OINTMENT | OPHTHALMIC | Status: DC | PRN
  Administered 2013-07-10: 1 via OPHTHALMIC

## 2013-07-10 MED ORDER — LIDOCAINE HCL (PF) 1 % IJ SOLN
INTRAMUSCULAR | Status: DC | PRN
  Administered 2013-07-10: .4 mL

## 2013-07-10 MED ORDER — PROVISC 10 MG/ML IO SOLN
INTRAOCULAR | Status: DC | PRN
  Administered 2013-07-10: 8.5 mg via INTRAOCULAR

## 2013-07-10 MED ORDER — LACTATED RINGERS IV SOLN
INTRAVENOUS | Status: DC
  Administered 2013-07-10: 12:00:00 via INTRAVENOUS

## 2013-07-10 MED ORDER — TETRACAINE HCL 0.5 % OP SOLN
1.0000 [drp] | OPHTHALMIC | Status: AC
  Administered 2013-07-10 (×3): 1 [drp] via OPHTHALMIC

## 2013-07-10 MED ORDER — BSS IO SOLN
INTRAOCULAR | Status: DC | PRN
  Administered 2013-07-10: 15 mL via INTRAOCULAR

## 2013-07-10 MED ORDER — MIDAZOLAM HCL 2 MG/2ML IJ SOLN
1.0000 mg | INTRAMUSCULAR | Status: DC | PRN
  Administered 2013-07-10: 2 mg via INTRAVENOUS

## 2013-07-10 MED ORDER — MIDAZOLAM HCL 2 MG/2ML IJ SOLN
INTRAMUSCULAR | Status: AC
  Filled 2013-07-10: qty 2

## 2013-07-10 MED ORDER — EPINEPHRINE HCL 1 MG/ML IJ SOLN
INTRAMUSCULAR | Status: AC
  Filled 2013-07-10: qty 1

## 2013-07-10 MED ORDER — PHENYLEPHRINE HCL 2.5 % OP SOLN
1.0000 [drp] | OPHTHALMIC | Status: AC
  Administered 2013-07-10 (×3): 1 [drp] via OPHTHALMIC

## 2013-07-10 MED ORDER — EPINEPHRINE HCL 1 MG/ML IJ SOLN
INTRAOCULAR | Status: DC | PRN
  Administered 2013-07-10: 12:00:00

## 2013-07-10 MED ORDER — CYCLOPENTOLATE-PHENYLEPHRINE 0.2-1 % OP SOLN
1.0000 [drp] | OPHTHALMIC | Status: AC
  Administered 2013-07-10 (×3): 1 [drp] via OPHTHALMIC

## 2013-07-10 SURGICAL SUPPLY — 32 items
CAPSULAR TENSION RING-AMO (OPHTHALMIC RELATED) IMPLANT
CLOTH BEACON ORANGE TIMEOUT ST (SAFETY) ×1 IMPLANT
EYE SHIELD UNIVERSAL CLEAR (GAUZE/BANDAGES/DRESSINGS) ×1 IMPLANT
GLOVE BIO SURGEON STRL SZ 6.5 (GLOVE) IMPLANT
GLOVE BIOGEL PI IND STRL 6.5 (GLOVE) IMPLANT
GLOVE BIOGEL PI IND STRL 7.0 (GLOVE) IMPLANT
GLOVE BIOGEL PI IND STRL 7.5 (GLOVE) IMPLANT
GLOVE BIOGEL PI INDICATOR 6.5 (GLOVE) ×1
GLOVE BIOGEL PI INDICATOR 7.0 (GLOVE)
GLOVE BIOGEL PI INDICATOR 7.5 (GLOVE) ×1
GLOVE ECLIPSE 6.5 STRL STRAW (GLOVE) IMPLANT
GLOVE ECLIPSE 7.0 STRL STRAW (GLOVE) IMPLANT
GLOVE ECLIPSE 7.5 STRL STRAW (GLOVE) IMPLANT
GLOVE EXAM NITRILE LRG STRL (GLOVE) IMPLANT
GLOVE EXAM NITRILE MD LF STRL (GLOVE) IMPLANT
GLOVE SKINSENSE NS SZ6.5 (GLOVE)
GLOVE SKINSENSE NS SZ7.0 (GLOVE)
GLOVE SKINSENSE STRL SZ6.5 (GLOVE) IMPLANT
GLOVE SKINSENSE STRL SZ7.0 (GLOVE) IMPLANT
KIT VITRECTOMY (OPHTHALMIC RELATED) IMPLANT
PAD ARMBOARD 7.5X6 YLW CONV (MISCELLANEOUS) ×1 IMPLANT
PROC W NO LENS (INTRAOCULAR LENS)
PROC W SPEC LENS (INTRAOCULAR LENS)
PROCESS W NO LENS (INTRAOCULAR LENS) IMPLANT
PROCESS W SPEC LENS (INTRAOCULAR LENS) IMPLANT
RING MALYGIN (MISCELLANEOUS) IMPLANT
SIGHTPATH CAT PROC W REG LENS (Ophthalmic Related) ×2 IMPLANT
SYR TB 1ML LL NO SAFETY (SYRINGE) ×1 IMPLANT
TAPE SURG TRANSPORE 1 IN (GAUZE/BANDAGES/DRESSINGS) IMPLANT
TAPE SURGICAL TRANSPORE 1 IN (GAUZE/BANDAGES/DRESSINGS) ×1
VISCOELASTIC ADDITIONAL (OPHTHALMIC RELATED) IMPLANT
WATER STERILE IRR 250ML POUR (IV SOLUTION) ×1 IMPLANT

## 2013-07-10 NOTE — Anesthesia Postprocedure Evaluation (Signed)
  Anesthesia Post-op Note  Patient: [name]  Procedure(s) Performed: Procedure(s) (LRB): CATARACT EXTRACTION PHACO AND INTRAOCULAR LENS PLACEMENT (IOC) (Left)  Patient Location:  Short Stay  Anesthesia Type: MAC  Level of Consciousness: awake  Airway and Oxygen Therapy: Patient Spontanous Breathing  Post-op Pain: none  Post-op Assessment: Post-op Vital signs reviewed, Patient's Cardiovascular Status Stable, Respiratory Function Stable, Patent Airway, No signs of Nausea or vomiting and Pain level controlled  Post-op Vital Signs: Reviewed and stable  Complications: No apparent anesthesia complications

## 2013-07-10 NOTE — Op Note (Signed)
Date of Admission: 07/10/2013  Date of Surgery: 07/10/2013  Pre-Op Dx: Cataract  Left  Eye  Post-Op Dx: Cataract  Left  Eye,  Dx Code 366.19  Surgeon: Tonny Branch, M.D.  Assistants: None  Anesthesia: Topical with MAC  Indications: Painless, progressive loss of vision with compromise of daily activities.  Surgery: Cataract Extraction with Intraocular lens Implant Left Eye  Discription: The patient had dilating drops and viscous lidocaine placed into the left eye in the pre-op holding area. After transfer to the operating room, a time out was performed. The patient was then prepped and draped. Beginning with a 75 degree blade a paracentesis port was made at the surgeon's 2 o'clock position. The anterior chamber was then filled with 1% non-preserved lidocaine. This was followed by filling the anterior chamber with Provisc. A bent cystatome needle was used to create a continuous tear capsulotomy. Hydrodissection was performed with balanced salt solution on a Fine canula. The lens nucleus was then removed using the phacoemulsification handpiece. Residual cortex was removed with the I&A handpiece. The anterior chamber and capsular bag were refilled with Provisc. A posterior chamber intraocular lens was placed into the capsular bag with it's injector. The implant was positioned with the Kuglan hook. The Provisc was then removed from the anterior chamber and capsular bag with the I&A handpiece. Stromal hydration of the main incision and paracentesis port was performed with BSS on a Fine canula. The wounds were tested for leak which was negative. The patient tolerated the procedure well. There were no operative complications. The patient was then transferred to the recovery room in stable condition.  Complications: None  Specimen: None  EBL: None  Prosthetic device: B&L enVista, MX60, power 21.5D, SN [id number].

## 2013-07-10 NOTE — Anesthesia Procedure Notes (Signed)
Procedure Name: MAC Date/Time: 07/10/2013 12:20 PM Performed by: Vista Deck Pre-anesthesia Checklist: Patient identified, Emergency Drugs available, Suction available, Timeout performed and Patient being monitored Patient Re-evaluated:Patient Re-evaluated prior to inductionOxygen Delivery Method: Nasal Cannula

## 2013-07-10 NOTE — H&P (Signed)
I have reviewed the H&P, the patient was re-examined, and I have identified no interval changes in medical condition and plan of care since the history and physical of record  

## 2013-07-10 NOTE — Transfer of Care (Signed)
Immediate Anesthesia Transfer of Care Note  Patient: [name]  Procedure(s) Performed: Procedure(s) (LRB): CATARACT EXTRACTION PHACO AND INTRAOCULAR LENS PLACEMENT (IOC) (Left)  Patient Location: Shortstay  Anesthesia Type: MAC  Level of Consciousness: awake  Airway & Oxygen Therapy: Patient Spontanous Breathing   Post-op Assessment: Report given to PACU RN, Post -op Vital signs reviewed and stable and Patient moving all extremities  Post vital signs: Reviewed and stable  Complications: No apparent anesthesia complications

## 2013-07-10 NOTE — Anesthesia Preprocedure Evaluation (Signed)
Anesthesia Evaluation  Patient identified by MRN, date of birth, ID band Patient awake    Reviewed: Allergy & Precautions, H&P , NPO status , Patient's Chart, lab work & pertinent test results  Airway Mallampati: II      Dental  (+) Teeth Intact   Pulmonary neg pulmonary ROS,  breath sounds clear to auscultation        Cardiovascular hypertension, Pt. on medications + Peripheral Vascular Disease Rhythm:Regular Rate:Bradycardia     Neuro/Psych    GI/Hepatic   Endo/Other  Hypothyroidism   Renal/GU      Musculoskeletal   Abdominal   Peds  Hematology   Anesthesia Other Findings   Reproductive/Obstetrics                           Anesthesia Physical Anesthesia Plan  ASA: II  Anesthesia Plan: MAC   Post-op Pain Management:    Induction: Intravenous  Airway Management Planned: Nasal Cannula  Additional Equipment:   Intra-op Plan:   Post-operative Plan:   Informed Consent: I have reviewed the patients History and Physical, chart, labs and discussed the procedure including the risks, benefits and alternatives for the proposed anesthesia with the patient or authorized representative who has indicated his/her understanding and acceptance.     Plan Discussed with:   Anesthesia Plan Comments:         Anesthesia Quick Evaluation

## 2013-07-11 ENCOUNTER — Encounter (HOSPITAL_COMMUNITY): Payer: Self-pay | Admitting: Ophthalmology

## 2013-07-29 ENCOUNTER — Other Ambulatory Visit: Payer: Self-pay | Admitting: *Deleted

## 2013-07-29 DIAGNOSIS — M4802 Spinal stenosis, cervical region: Secondary | ICD-10-CM

## 2013-07-29 DIAGNOSIS — M542 Cervicalgia: Secondary | ICD-10-CM

## 2013-07-29 MED ORDER — HYDROCODONE-ACETAMINOPHEN 7.5-325 MG PO TABS: 1.0000 | ORAL_TABLET | Freq: Four times a day (QID) | ORAL | Status: DC | PRN

## 2013-08-21 ENCOUNTER — Other Ambulatory Visit (HOSPITAL_COMMUNITY): Payer: Self-pay | Admitting: Family Medicine

## 2013-08-21 DIAGNOSIS — I739 Peripheral vascular disease, unspecified: Secondary | ICD-10-CM

## 2013-08-25 ENCOUNTER — Ambulatory Visit (HOSPITAL_COMMUNITY)
Admission: RE | Admit: 2013-08-25 | Discharge: 2013-08-25 | Disposition: A | Discharge status: Home / Self Care | Payer: PRIVATE HEALTH INSURANCE | Source: Ambulatory Visit | Attending: Family Medicine | Admitting: Family Medicine

## 2013-08-25 DIAGNOSIS — I739 Peripheral vascular disease, unspecified: Secondary | ICD-10-CM

## 2013-08-25 DIAGNOSIS — M79609 Pain in unspecified limb: Secondary | ICD-10-CM | POA: Insufficient documentation

## 2013-09-02 ENCOUNTER — Other Ambulatory Visit: Payer: Self-pay | Admitting: *Deleted

## 2013-09-02 DIAGNOSIS — I739 Peripheral vascular disease, unspecified: Secondary | ICD-10-CM

## 2013-09-02 DIAGNOSIS — M79609 Pain in unspecified limb: Secondary | ICD-10-CM

## 2013-09-03 ENCOUNTER — Encounter: Payer: Self-pay | Admitting: Vascular Surgery

## 2013-09-04 ENCOUNTER — Ambulatory Visit (INDEPENDENT_AMBULATORY_CARE_PROVIDER_SITE_OTHER)
Admission: RE | Admit: 2013-09-04 | Discharge: 2013-09-04 | Disposition: A | Discharge status: Home / Self Care | Payer: PRIVATE HEALTH INSURANCE | Source: Ambulatory Visit | Attending: Vascular Surgery | Admitting: Vascular Surgery

## 2013-09-04 ENCOUNTER — Ambulatory Visit (INDEPENDENT_AMBULATORY_CARE_PROVIDER_SITE_OTHER): Payer: PRIVATE HEALTH INSURANCE | Admitting: Vascular Surgery

## 2013-09-04 ENCOUNTER — Encounter: Payer: Self-pay | Admitting: Vascular Surgery

## 2013-09-04 ENCOUNTER — Ambulatory Visit (HOSPITAL_COMMUNITY)
Admission: RE | Admit: 2013-09-04 | Discharge: 2013-09-04 | Disposition: A | Discharge status: Home / Self Care | Payer: PRIVATE HEALTH INSURANCE | Source: Ambulatory Visit | Attending: Vascular Surgery | Admitting: Vascular Surgery

## 2013-09-04 VITALS — BP 139/57 | HR 79 | Ht 60.0 in | Wt 142.0 lb

## 2013-09-04 DIAGNOSIS — I739 Peripheral vascular disease, unspecified: Secondary | ICD-10-CM

## 2013-09-04 DIAGNOSIS — M542 Cervicalgia: Secondary | ICD-10-CM

## 2013-09-04 DIAGNOSIS — Z48812 Encounter for surgical aftercare following surgery on the circulatory system: Secondary | ICD-10-CM | POA: Insufficient documentation

## 2013-09-04 DIAGNOSIS — M4802 Spinal stenosis, cervical region: Secondary | ICD-10-CM

## 2013-09-04 DIAGNOSIS — M79609 Pain in unspecified limb: Secondary | ICD-10-CM

## 2013-09-04 MED ORDER — GABAPENTIN 100 MG PO CAPS: 300.0000 mg | ORAL_CAPSULE | Freq: Three times a day (TID) | ORAL | Status: DC

## 2013-09-04 NOTE — Progress Notes (Signed)
[facility] [place] HISTORY AND PHYSICAL   History of Present Illness:  Patient is a 79 y.o. year old female who presents for follow-up evaluation for PAD.  She is on Plavix/Aspirin for antiplatelet therapy. Her atherosclerotic risk factors remain elevated cholesterol, hypertension.  These are all currently stable and followed by her primary care physician.  The patient currently has no claudication symptoms.  The patient denies rest pain or ulcers on the feet.  she does complain of a cramping-type sensation occurs in both of her feet usually in the evenings. She does not really describe any cramping in the calves. These cramps occur at rest not with activity. The patient has previously undergone multiple revascularizations of both lower extremities. She'll left femoropopliteal bypass done by Dr. Nils Pyle several years ago. She underwent thrombectomy of her left external artery and redo left femoral to below-knee popliteal bypass with a vein graft in February 2010 by me. She also had an angioplasty of this in May of 2010 by Dr Trula Slade. She's also previously had a right femoropopliteal bypass by Dr. Nils Pyle. This has one-vessel runoff via anterior tibial artery on her most recent arteriogram.  She does walk but is not walking much more than around the house due to the fact she states she just gets tired. She had a recent venous duplex exam at [facility] at which time they noted some thrombus in her left femoropopliteal bypass graft. She was referred here for further followup regarding this.     Past Medical History  Diagnosis Date  . HTN (hypertension)   . Rotator cuff insufficiency 03/23/2011  . Arthritis   . Hyperlipidemia   . Thyroid disease   . Hypothyroidism       Past Surgical History  Procedure Laterality Date  . Vesicovaginal fistula closure w/ tah    . Carpal tunnel release      right side  . Tonsillectomy    . Cholecystectomy    . Left shoulder  hemiarthroplasty    . Appendectomy    . Grafts of bilateral lower extremities    . Colon surgery      sigmoid colectomy complicated by anastomotic leak  . Colostomy  transverse loop, 2006  . Abdominal hysterectomy    . Cataract extraction w/phaco Right 06/05/2013    Procedure: CATARACT EXTRACTION PHACO AND INTRAOCULAR LENS PLACEMENT (IOC);  Surgeon: Tonny Branch, MD;  Location: [facility];  Service: Ophthalmology;  Laterality: Right;  CDE: 10.58  . Cataract extraction w/phaco Left 07/10/2013    Procedure: CATARACT EXTRACTION PHACO AND INTRAOCULAR LENS PLACEMENT (IOC);  Surgeon: Tonny Branch, MD;  Location: [facility];  Service: Ophthalmology;  Laterality: Left;  CDE: 17.35    Review of Systems:  Neurologic: sensation in the feet is intact Cardiac:denies shortness of breath or chest pain Pulmonary: denies cough or wheeze  Social History History   Substance Use Topics   .  Smoking status:  Never Smoker    .  Smokeless tobacco:  Never Used   .  Alcohol Use:  No     Allergies  No Known Allergies    Current Outpatient Prescriptions on File Prior to Visit  Medication Sig Dispense Refill  . ferrous sulfate 325 (65 FE) MG tablet Take 325 mg by mouth daily with breakfast.      . levothyroxine (SYNTHROID, LEVOTHROID) 75 MCG tablet Take 75 mcg by mouth daily.        Marland Kitchen amLODipine (NORVASC) 10 MG tablet Take 10 mg  by mouth daily.      Marland Kitchen HYDROcodone-acetaminophen (NORCO) 7.5-325 MG per tablet Take 1 tablet by mouth every 6 (six) hours as needed for pain.  90 tablet  5  . Multiple Vitamin (MULTIVITAMIN WITH MINERALS) TABS Take 1 tablet by mouth daily.       No current facility-administered medications on file prior to visit.   Neurontin 100 mg 3 times daily   Physical Examination    Filed Vitals:   09/04/13 1500  BP: 139/57  Pulse: 79  Height: 5' (1.524 m)  Weight: 142 lb (64.411 kg)  SpO2: 100%   General:  Alert and oriented, no acute distress HEENT: Normal Neurologic: Upper and lower  extremity motor 5/5 and symmetric Extremities:  2+ femoral popliteal pulses 2+ popliteal pulses 2+ left dorsalis pedis pulse   absent pedal pulses right foot Skin: no ulcer or rash  DATA: She had bilateral graft duplex scans today which showed that both bypasses are patent. I reviewed and interpreted her study. ABIs today were 0.87 on the left 0.73 on the right no thrombus noted within the bypass graft area to duplex study report from [facility] was reviewed which showed thrombus in the left femoropopliteal bypass graft. This could not be found today on our ultrasound exam.  ASSESSMENT: Patent bilateral lower extremity bypass grafts   PLAN:  Cramping in feet is most likely secondary to degenerative arthritis or some type of neuropathic symptoms. No symptoms of claudication rest pain or nonhealing wounds.  She has well-perfused feet with patent bypass at this time. She will have a followup graft duplex scan in 1 year. We will also repeat her ABIs at that time. I increased her neurontin to 300 mg 3 times daily today.  Ruta Hinds, MD [facility] [place] Office: [phone] Pager: [phone]

## 2013-09-05 NOTE — Addendum Note (Signed)
Addended by: Mena Goes on: 09/05/2013 09:50 AM   Modules accepted: Orders

## 2013-10-02 ENCOUNTER — Telehealth: Payer: Self-pay | Admitting: Orthopedic Surgery

## 2013-10-02 ENCOUNTER — Other Ambulatory Visit: Payer: Self-pay | Admitting: *Deleted

## 2013-10-02 DIAGNOSIS — M542 Cervicalgia: Secondary | ICD-10-CM

## 2013-10-02 DIAGNOSIS — M4802 Spinal stenosis, cervical region: Secondary | ICD-10-CM

## 2013-10-02 NOTE — Telephone Encounter (Signed)
[name] asked for a Hydrocodone prescription. Her # [phone]

## 2013-10-02 NOTE — Telephone Encounter (Signed)
Ok need dose

## 2013-10-02 NOTE — Telephone Encounter (Signed)
Norco 7.5/325 is the current dose. Keep same or decrease to 5/325?

## 2013-10-03 ENCOUNTER — Other Ambulatory Visit: Payer: Self-pay | Admitting: *Deleted

## 2013-10-03 DIAGNOSIS — M4802 Spinal stenosis, cervical region: Secondary | ICD-10-CM

## 2013-10-03 DIAGNOSIS — M542 Cervicalgia: Secondary | ICD-10-CM

## 2013-10-03 MED ORDER — HYDROCODONE-ACETAMINOPHEN 7.5-325 MG PO TABS: 1.0000 | ORAL_TABLET | Freq: Four times a day (QID) | ORAL | Status: DC | PRN

## 2013-10-03 NOTE — Telephone Encounter (Signed)
Keep same dose   Chronic pain

## 2013-10-03 NOTE — Telephone Encounter (Signed)
Refilled

## 2013-10-06 NOTE — Telephone Encounter (Signed)
Patient advised to pick up prescription.

## 2013-10-07 NOTE — Telephone Encounter (Signed)
Patient picked up prescription.

## 2013-11-11 ENCOUNTER — Other Ambulatory Visit: Payer: Self-pay | Admitting: Orthopedic Surgery

## 2013-11-11 ENCOUNTER — Telehealth: Payer: Self-pay | Admitting: Orthopedic Surgery

## 2013-11-11 DIAGNOSIS — M542 Cervicalgia: Secondary | ICD-10-CM

## 2013-11-11 DIAGNOSIS — M4802 Spinal stenosis, cervical region: Secondary | ICD-10-CM

## 2013-11-11 MED ORDER — HYDROCODONE-ACETAMINOPHEN 7.5-325 MG PO TABS: 1.0000 | ORAL_TABLET | Freq: Four times a day (QID) | ORAL | Status: DC | PRN

## 2013-11-11 NOTE — Telephone Encounter (Signed)
Called patient to advise she could pick up prescription Dr. Aline Brochure refilled.

## 2013-11-11 NOTE — Telephone Encounter (Signed)
Patient stopped in to office to request refill on Hydrodone prescription, from [place].  Patient is not currently scheduled for an appointment/ please advise.  Patient [phone].

## 2013-11-11 NOTE — Telephone Encounter (Signed)
Routing to Dr Harrison 

## 2013-11-17 NOTE — Telephone Encounter (Signed)
Patient picked up prescription noted, 11/11/13.

## 2013-12-22 ENCOUNTER — Telehealth: Payer: Self-pay | Admitting: Orthopedic Surgery

## 2013-12-22 ENCOUNTER — Other Ambulatory Visit: Payer: Self-pay | Admitting: *Deleted

## 2013-12-22 DIAGNOSIS — M4802 Spinal stenosis, cervical region: Secondary | ICD-10-CM

## 2013-12-22 DIAGNOSIS — M542 Cervicalgia: Secondary | ICD-10-CM

## 2013-12-22 MED ORDER — GABAPENTIN 100 MG PO CAPS: 300.0000 mg | ORAL_CAPSULE | Freq: Three times a day (TID) | ORAL | Status: DC

## 2013-12-22 NOTE — Telephone Encounter (Signed)
[name] wants a prescription for Hydrocodone

## 2013-12-22 NOTE — Telephone Encounter (Signed)
Patient is currently on Norco 7.5/325 mg. Routing to DR.Aline Brochure

## 2013-12-23 ENCOUNTER — Other Ambulatory Visit: Payer: Self-pay | Admitting: Orthopedic Surgery

## 2013-12-23 DIAGNOSIS — M542 Cervicalgia: Secondary | ICD-10-CM

## 2013-12-23 DIAGNOSIS — M4802 Spinal stenosis, cervical region: Secondary | ICD-10-CM

## 2013-12-23 MED ORDER — HYDROCODONE-ACETAMINOPHEN 7.5-325 MG PO TABS: 1.0000 | ORAL_TABLET | Freq: Four times a day (QID) | ORAL | Status: DC | PRN

## 2013-12-23 NOTE — Telephone Encounter (Signed)
Prescription picked up by the patient

## 2014-02-10 ENCOUNTER — Telehealth: Payer: Self-pay | Admitting: Orthopedic Surgery

## 2014-02-10 ENCOUNTER — Other Ambulatory Visit: Payer: Self-pay | Admitting: *Deleted

## 2014-02-10 DIAGNOSIS — M542 Cervicalgia: Secondary | ICD-10-CM

## 2014-02-10 DIAGNOSIS — M4802 Spinal stenosis, cervical region: Secondary | ICD-10-CM

## 2014-02-10 MED ORDER — HYDROCODONE-ACETAMINOPHEN 7.5-325 MG PO TABS: 1.0000 | ORAL_TABLET | Freq: Four times a day (QID) | ORAL | Status: DC | PRN

## 2014-02-10 NOTE — Telephone Encounter (Signed)
Routing to DR. Aline Brochure

## 2014-02-10 NOTE — Telephone Encounter (Signed)
Patient wants a prescriptoin for Hydrocodone 7.5/325

## 2014-02-10 NOTE — Telephone Encounter (Signed)
Refilled per Dr. Aline Brochure and advised patient that it was ready for pick up.

## 2014-02-10 NOTE — Telephone Encounter (Signed)
refill 

## 2014-03-12 ENCOUNTER — Ambulatory Visit: Payer: PRIVATE HEALTH INSURANCE | Admitting: Vascular Surgery

## 2014-03-12 ENCOUNTER — Encounter (HOSPITAL_COMMUNITY): Payer: PRIVATE HEALTH INSURANCE

## 2014-03-30 ENCOUNTER — Telehealth: Payer: Self-pay | Admitting: Orthopedic Surgery

## 2014-03-30 NOTE — Telephone Encounter (Signed)
Patient stopped into office, requests medication refill - Hydrocodone/Norco, 7.5-325; her phone # [phone].

## 2014-03-31 ENCOUNTER — Other Ambulatory Visit: Payer: Self-pay | Admitting: Orthopedic Surgery

## 2014-03-31 ENCOUNTER — Other Ambulatory Visit: Payer: Self-pay | Admitting: *Deleted

## 2014-03-31 DIAGNOSIS — M4802 Spinal stenosis, cervical region: Secondary | ICD-10-CM

## 2014-03-31 DIAGNOSIS — M542 Cervicalgia: Secondary | ICD-10-CM

## 2014-03-31 MED ORDER — HYDROCODONE-ACETAMINOPHEN 7.5-325 MG PO TABS: 1.0000 | ORAL_TABLET | Freq: Four times a day (QID) | ORAL | Status: DC | PRN

## 2014-03-31 MED ORDER — GABAPENTIN 100 MG PO CAPS: 300.0000 mg | ORAL_CAPSULE | Freq: Three times a day (TID) | ORAL | Status: DC

## 2014-03-31 NOTE — Telephone Encounter (Signed)
Routing to Dr Harrison 

## 2014-04-02 ENCOUNTER — Emergency Department (HOSPITAL_COMMUNITY): Payer: PRIVATE HEALTH INSURANCE

## 2014-04-02 ENCOUNTER — Emergency Department (HOSPITAL_COMMUNITY)
Admission: EM | Admit: 2014-04-02 | Discharge: 2014-04-02 | Disposition: A | Discharge status: Home / Self Care | Payer: PRIVATE HEALTH INSURANCE | Attending: Emergency Medicine | Admitting: Emergency Medicine

## 2014-04-02 ENCOUNTER — Encounter (HOSPITAL_COMMUNITY): Payer: Self-pay | Admitting: Emergency Medicine

## 2014-04-02 DIAGNOSIS — Z79899 Other long term (current) drug therapy: Secondary | ICD-10-CM | POA: Insufficient documentation

## 2014-04-02 DIAGNOSIS — M79609 Pain in unspecified limb: Secondary | ICD-10-CM | POA: Insufficient documentation

## 2014-04-02 DIAGNOSIS — M25519 Pain in unspecified shoulder: Secondary | ICD-10-CM | POA: Insufficient documentation

## 2014-04-02 DIAGNOSIS — R079 Chest pain, unspecified: Secondary | ICD-10-CM | POA: Insufficient documentation

## 2014-04-02 DIAGNOSIS — E039 Hypothyroidism, unspecified: Secondary | ICD-10-CM | POA: Insufficient documentation

## 2014-04-02 DIAGNOSIS — Z96619 Presence of unspecified artificial shoulder joint: Secondary | ICD-10-CM | POA: Insufficient documentation

## 2014-04-02 DIAGNOSIS — M25512 Pain in left shoulder: Secondary | ICD-10-CM

## 2014-04-02 DIAGNOSIS — Z8739 Personal history of other diseases of the musculoskeletal system and connective tissue: Secondary | ICD-10-CM | POA: Insufficient documentation

## 2014-04-02 DIAGNOSIS — I10 Essential (primary) hypertension: Secondary | ICD-10-CM | POA: Insufficient documentation

## 2014-04-02 DIAGNOSIS — M25511 Pain in right shoulder: Secondary | ICD-10-CM

## 2014-04-02 DIAGNOSIS — G8929 Other chronic pain: Secondary | ICD-10-CM | POA: Insufficient documentation

## 2014-04-02 LAB — I-STAT TROPONIN, ED: Troponin i, poc: 0 ng/mL (ref 0.00–0.08)

## 2014-04-02 MED ORDER — METHOCARBAMOL 500 MG PO TABS: 500.0000 mg | ORAL_TABLET | Freq: Three times a day (TID) | ORAL | Status: DC | PRN

## 2014-04-02 NOTE — Telephone Encounter (Signed)
Dr. Aline Brochure refilled.

## 2014-04-02 NOTE — Discharge Instructions (Signed)
Use heat on your shoulders. You need to get your pain medication filled so you can take it. Take he robaxin to relax your chest wall muscles.

## 2014-04-02 NOTE — ED Provider Notes (Signed)
CSN: [id number]     Arrival date & time 04/02/14  1237 History  This chart was scribed for [name], MD by Ludger Nutting, ED Scribe. This patient was seen in room APA08/[facility]08 and the patient's care was started 4:01 PM.    Chief Complaint  Patient presents with  . Shoulder Pain  . Chest Pain      The history is provided by the patient. No language interpreter was used.    HPI Comments: [name] is a 80 y.o. female with past medical history of HTN, HLD, rotator cuff insufficiency who presents to the Emergency Department complaining of a couple of days of constant, unchanged right chest. She states the chest pain is worsened with movement of upper extremities. She denies cough, SOB, fever. She also has about 1 year of bilateral shoulder and arm pain that has worsened a few days ago. She reports running out of prednisone and pain medication about 3-4 days ago. She has a rx for Norco but was unable to fill it due to financial reasons. She states she will be able to fill it tomorrow. She walks without assistance.   She denies smoking or alcohol use. She lives with her nephew.   PCP [facility] Dr Aline Brochure  Past Medical History  Diagnosis Date  . HTN (hypertension)   . Rotator cuff insufficiency 03/23/2011  . Arthritis   . Hyperlipidemia   . Thyroid disease   . Hypothyroidism    Past Surgical History  Procedure Laterality Date  . Vesicovaginal fistula closure w/ tah    . Carpal tunnel release      right side  . Tonsillectomy    . Cholecystectomy    . Left shoulder hemiarthroplasty    . Appendectomy    . Grafts of bilateral lower extremities    . Colon surgery      sigmoid colectomy complicated by anastomotic leak  . Colostomy  transverse loop, 2006  . Abdominal hysterectomy    . Cataract extraction w/phaco Right 06/05/2013    Procedure: CATARACT EXTRACTION PHACO AND INTRAOCULAR LENS PLACEMENT (IOC);  Surgeon: Tonny Branch, MD;  Location: [facility];  Service:  Ophthalmology;  Laterality: Right;  CDE: 10.58  . Cataract extraction w/phaco Left 07/10/2013    Procedure: CATARACT EXTRACTION PHACO AND INTRAOCULAR LENS PLACEMENT (IOC);  Surgeon: Tonny Branch, MD;  Location: [facility];  Service: Ophthalmology;  Laterality: Left;  CDE: 17.35   Family History  Problem Relation Age of Onset  . Stroke Mother   . Heart disease Father    History  Substance Use Topics  . Smoking status: Never Smoker   . Smokeless tobacco: Never Used  . Alcohol Use: No   Lives at home Lives with husband and nephew   OB History   Grav Para Term Preterm Abortions TAB SAB Ect Mult Living                 Review of Systems  Constitutional: Negative for fever.  Respiratory: Negative for cough and shortness of breath.   Cardiovascular: Positive for chest pain.  Musculoskeletal: Positive for arthralgias (bilateral shoulder and arm pain).  All other systems reviewed and are negative.     Allergies  Review of patient's allergies indicates no known allergies.  Home Medications   Prior to Admission medications   Medication Sig Start Date End Date Taking? Authorizing Provider  amLODipine (NORVASC) 10 MG tablet Take 10 mg by mouth daily.    Historical Provider, MD  ferrous sulfate 325 (65 FE) MG tablet Take 325 mg by mouth daily with breakfast.    Historical Provider, MD  gabapentin (NEURONTIN) 100 MG capsule Take 3 capsules (300 mg total) by mouth 3 (three) times daily. 03/31/14 03/31/15  Elam Dutch, MD  HYDROcodone-acetaminophen (NORCO) 7.5-325 MG per tablet Take 1 tablet by mouth every 6 (six) hours as needed. 03/31/14   Carole Civil, MD  levothyroxine (SYNTHROID, LEVOTHROID) 75 MCG tablet Take 75 mcg by mouth daily.      Historical Provider, MD  Multiple Vitamin (MULTIVITAMIN WITH MINERALS) TABS Take 1 tablet by mouth daily.    Historical Provider, MD   BP 160/79  Pulse 88  Temp(Src) 97.8 F (36.6 C) (Oral)  Resp 18  Ht 5' (1.524 m)  Wt 143 lb (64.864 kg)   BMI 27.93 kg/m2  SpO2 100%  Vital signs normal     Physical Exam  Nursing note and vitals reviewed. Constitutional: She is oriented to person, place, and time. She appears well-developed and well-nourished.  Non-toxic appearance. She does not appear ill. No distress.  HENT:  Head: Normocephalic and atraumatic.  Right Ear: External ear normal.  Left Ear: External ear normal.  Nose: Nose normal. No mucosal edema or rhinorrhea.  Mouth/Throat: Oropharynx is clear and moist and mucous membranes are normal. No dental abscesses or uvula swelling.  Eyes: Conjunctivae and EOM are normal. Pupils are equal, round, and reactive to light.  Neck: Normal range of motion and full passive range of motion without pain. Neck supple.  Cardiovascular: Normal rate, regular rhythm and normal heart sounds.  Exam reveals no gallop and no friction rub.   No murmur heard. Pulmonary/Chest: Effort normal and breath sounds normal. No respiratory distress. She has no wheezes. She has no rhonchi. She has no rales. She exhibits tenderness (Tenderness to palpation over superior chest, medially under the clavicle. ). She exhibits no crepitus.    Abdominal: Soft. Normal appearance and bowel sounds are normal. She exhibits no distension. There is no tenderness. There is no rebound and no guarding.  Musculoskeletal: Normal range of motion. She exhibits no edema and no tenderness.  Moves all extremities well.   Neurological: She is alert and oriented to person, place, and time. She has normal strength. No cranial nerve deficit.  Skin: Skin is warm, dry and intact. No rash noted. No erythema. No pallor.  Psychiatric: She has a normal mood and affect. Her speech is normal and behavior is normal. Her mood appears not anxious.    ED Course  Procedures (including critical care time)  Medications - No data to display   DIAGNOSTIC STUDIES: Oxygen Saturation is 100% on RA, normal by my interpretation.    COORDINATION OF  CARE: 4:07 PM Discussed treatment plan with pt at bedside and pt agreed to plan which including pain pill, chest xray and troponin to check for heart attack.  I had ordered her tests including norco pill while in the ED, however the order was lost and when I rewrote the orders I over looked rewriting the norco. Pt however has norco at the pharmacy, she just needs to pick it up.    Labs Review Results for orders placed during the hospital encounter of 04/02/14  I-STAT TROPOININ, ED      Result Value Ref Range   Troponin i, poc 0.00  0.00 - 0.08 ng/mL   Comment 3             Laboratory interpretation all normal,  with constant right upper chest pain for 3 days it should be positive if cardiac.   Imaging Review Dg Chest 2 View  04/02/2014   CLINICAL DATA:  Chest pain and anterior left shoulder pain.  EXAM: CHEST  2 VIEW  COMPARISON:  Chest x-rays dated 01/07/2009 and 08/30/2006  FINDINGS: The heart size and pulmonary vascularity are normal and the lungs are clear. No acute osseous abnormality. Proximal left humeral prosthesis. Severe arthritic changes of the right shoulder. Flowing osteophytes fuse the entire thoracic spine.  IMPRESSION: No acute abnormalities.   Electronically Signed   By: Rozetta Nunnery M.D.   On: 04/02/2014 17:56     EKG Interpretation   Date/Time:  Thursday April 02 2014 12:45:47 EDT Ventricular Rate:  86 PR Interval:  184 QRS Duration: 82 QT Interval:  360 QTC Calculation: 430 R Axis:   63 Text Interpretation:  Normal sinus rhythm Possible Left atrial enlargement  Nonspecific T wave abnormality Since last tracing rate faster Confirmed by  Matthias Bogus  MD-I, Sandeep Delagarza ([id number]) on 04/02/2014 5:01:12 PM        Date: 04/02/2014  Rate: 86  Rhythm: normal sinus rhythm  QRS Axis: normal  Intervals: normal  ST/T Wave abnormalities: nonspecific ST/T changes  Conduction Disutrbances:none  Narrative Interpretation: LAE  Old EKG Reviewed: changes noted heart rate faster    MDM    Final diagnoses:  Chronic pain of both shoulders    I personally performed the services described in this documentation, which was scribed in my presence. The recorded information has been reviewed and considered.  Rolland Porter, MD, FACEP   [name], MD 04/02/14 [place]

## 2014-04-02 NOTE — ED Notes (Signed)
bil shoulder pain. Denies any injury.

## 2014-04-24 ENCOUNTER — Telehealth: Payer: Self-pay

## 2014-04-24 NOTE — Telephone Encounter (Signed)
Spoke with pt to schedule, dpm °

## 2014-04-24 NOTE — Telephone Encounter (Signed)
Discussed with Dr. Bridgett Larsson.  Recommended okay to schedule pt. Within next week for evaluation.  Will contact pt. to schedule appt.

## 2014-04-24 NOTE — Telephone Encounter (Addendum)
Pt. Called to report increase leg pain in bilateral thigh and down to lower legs over past 2 days.  Reported pain is constant; with activity and @ rest. Denies open sores, numbness/tingling, no temperature change, and no increase discomfort with walking up incline.  Stated the lower left leg appears a little darker over the past month. Advised will discuss with doctor in the office, and to call office or go to ER if symptoms worsen, prior to appt.  Verb. Understanding.

## 2014-04-28 ENCOUNTER — Encounter: Payer: Self-pay | Admitting: Family

## 2014-04-29 ENCOUNTER — Ambulatory Visit (INDEPENDENT_AMBULATORY_CARE_PROVIDER_SITE_OTHER)
Admission: RE | Admit: 2014-04-29 | Discharge: 2014-04-29 | Disposition: A | Discharge status: Home / Self Care | Payer: PRIVATE HEALTH INSURANCE | Source: Ambulatory Visit | Attending: Vascular Surgery | Admitting: Vascular Surgery

## 2014-04-29 ENCOUNTER — Ambulatory Visit (INDEPENDENT_AMBULATORY_CARE_PROVIDER_SITE_OTHER): Payer: PRIVATE HEALTH INSURANCE | Admitting: Family

## 2014-04-29 ENCOUNTER — Encounter: Payer: Self-pay | Admitting: Family

## 2014-04-29 ENCOUNTER — Ambulatory Visit (HOSPITAL_COMMUNITY)
Admission: RE | Admit: 2014-04-29 | Discharge: 2014-04-29 | Disposition: A | Discharge status: Home / Self Care | Payer: PRIVATE HEALTH INSURANCE | Source: Ambulatory Visit | Attending: Vascular Surgery | Admitting: Vascular Surgery

## 2014-04-29 VITALS — BP 114/61 | HR 65 | Resp 16 | Ht 60.0 in | Wt 146.0 lb

## 2014-04-29 DIAGNOSIS — Z48812 Encounter for surgical aftercare following surgery on the circulatory system: Secondary | ICD-10-CM

## 2014-04-29 DIAGNOSIS — I739 Peripheral vascular disease, unspecified: Secondary | ICD-10-CM

## 2014-04-29 DIAGNOSIS — I70219 Atherosclerosis of native arteries of extremities with intermittent claudication, unspecified extremity: Secondary | ICD-10-CM

## 2014-04-29 DIAGNOSIS — M79609 Pain in unspecified limb: Secondary | ICD-10-CM | POA: Insufficient documentation

## 2014-04-29 NOTE — Progress Notes (Signed)
[facility] [place] HISTORY AND PHYSICAL -PAD  History of Present Illness [name] is a 80 y.o. female patient of Dr. Oneida Alar who is s/p rRight femoral to above knee popliteal bypass graft 2005; left femoral to below knee popliteal bypass graft 01/2009 with thrombectomy of the iliac artery. She also had an angioplasty of this in May of 2010 by Dr Trula Slade.  She returns today for follow up.  She is on Plavix/Aspirin for antiplatelet therapy. Her atherosclerotic risk factors remain elevated cholesterol, hypertension. These are all currently stable and followed by her primary care physician. She does walk but is not walking much more than around the house due to the fact she states she just gets tired. She had a recent venous duplex exam at [facility] at which time they noted some thrombus in her left femoropopliteal bypass graft. She was referred here for further followup regarding this. Last visit:  Cramping in feet is most likely secondary to degenerative arthritis or some type of neuropathic symptoms. No symptoms of claudication rest pain or nonhealing wounds. She has well-perfused feet with patent bypass at that time.  Her biggest concern today is the pain in her right knee. The gabapentin does not seem to be helping the right knee pain, has little pain in her left leg. She has generalized stiffness every morning that resolves within 30 minutes. She does not seem to have claudication pain in her legs with walking, denies non healing wounds, denies history of stroke or TIA.  The patient denies New Medical or Surgical History.  Pt Diabetic: Yes, diet controlled Pt smoker: non-smoker, never  Pt meds include: Statin :No  ASA: No, sees a nephrologist Other anticoagulants/antiplatelets: no  Past Medical History  Diagnosis Date  . HTN (hypertension)   . Rotator cuff insufficiency 03/23/2011  . Arthritis   . Hyperlipidemia   . Thyroid disease   .  Hypothyroidism   . Diabetes mellitus without complication     Social History History  Substance Use Topics  . Smoking status: Never Smoker   . Smokeless tobacco: Never Used  . Alcohol Use: No    Family History Family History  Problem Relation Age of Onset  . Stroke Mother   . Diabetes Mother     Leg amputation  . Hypertension Mother   . Heart disease Father   . Heart attack Father   . Cancer Daughter     Past Surgical History  Procedure Laterality Date  . Vesicovaginal fistula closure w/ tah    . Carpal tunnel release      right side  . Tonsillectomy    . Cholecystectomy    . Left shoulder hemiarthroplasty    . Appendectomy    . Grafts of bilateral lower extremities    . Colon surgery      sigmoid colectomy complicated by anastomotic leak  . Colostomy  transverse loop, 2006  . Abdominal hysterectomy    . Cataract extraction w/phaco Right 06/05/2013    Procedure: CATARACT EXTRACTION PHACO AND INTRAOCULAR LENS PLACEMENT (IOC);  Surgeon: Tonny Branch, MD;  Location: [facility];  Service: Ophthalmology;  Laterality: Right;  CDE: 10.58  . Cataract extraction w/phaco Left 07/10/2013    Procedure: CATARACT EXTRACTION PHACO AND INTRAOCULAR LENS PLACEMENT (IOC);  Surgeon: Tonny Branch, MD;  Location: [facility];  Service: Ophthalmology;  Laterality: Left;  CDE: 17.35  . Eye surgery      No Known Allergies  Current Outpatient Prescriptions  Medication Sig  Dispense Refill  . amLODipine (NORVASC) 10 MG tablet Take 10 mg by mouth daily.      . ferrous sulfate 325 (65 FE) MG tablet Take 325 mg by mouth daily with breakfast.      . gabapentin (NEURONTIN) 100 MG capsule Take 3 capsules (300 mg total) by mouth 3 (three) times daily.  90 capsule  5  . HYDROcodone-acetaminophen (NORCO) 7.5-325 MG per tablet Take 1 tablet by mouth every 6 (six) hours as needed.  120 tablet  0  . levothyroxine (SYNTHROID, LEVOTHROID) 75 MCG tablet Take 75 mcg by mouth daily.        . methocarbamol (ROBAXIN) 500 MG  tablet Take 1 tablet (500 mg total) by mouth every 8 (eight) hours as needed for muscle spasms.  30 tablet  0  . Multiple Vitamin (MULTIVITAMIN WITH MINERALS) TABS Take 1 tablet by mouth daily.      . predniSONE (DELTASONE) 10 MG tablet Take 10 mg by mouth 2 (two) times daily.       No current facility-administered medications for this visit.    ROS: See HPI for pertinent positives and negatives.   Physical Examination  Filed Vitals:   04/29/14 1008  BP: 114/61  Pulse: 65  Resp: 16  Height: 5' (1.524 m)  Weight: 146 lb (66.225 kg)  SpO2: 99%   Body mass index is 28.51 kg/(m^2).  General: A&O x 3, WDWN. Gait: normal Eyes: PERRLA. Pulmonary: CTAB, without wheezes , rales or rhonchi. Cardiac: regular Rythm , without detected murmur.         Carotid Bruits Left Right   Negative Negative  Aorta is not palpable. Radial pulses: are 2+ palpable and =                           VASCULAR EXAM: Extremities without ischemic changes  without Gangrene; without open wounds.                                                                                                          LE Pulses LEFT RIGHT       FEMORAL  3+ palpable  3+ palpable        POPLITEAL  not palpable   not palpable       POSTERIOR TIBIAL  not palpable   not palpable        DORSALIS PEDIS      ANTERIOR TIBIAL 1+ palpable  not palpable    Abdomen: soft, NT, no masses, colostomy bag in place. Skin: no rashes, no ulcers noted. Musculoskeletal: no muscle wasting or atrophy.  Neurologic: A&O X 3; Appropriate Affect ; SENSATION: normal; MOTOR FUNCTION:  moving all extremities equally, motor strength 4/5 throughout. Speech is fluent/normal. CN 2-12 intact.    Non-Invasive Vascular Imaging: DATE: 04/29/2014 LOWER EXTREMITY ARTERIAL DUPLEX EVALUATION    INDICATION: Follow up bypass grafts    PREVIOUS INTERVENTION(S): Right femoral to above knee popliteal bypass graft 2005; Left femoral to below knee popliteal  bypass graft 01/2009 with thrombectomy  of the iliac artery.    DUPLEX EXAM: Bilateral lower extremity arterial duplex    RIGHT  LEFT   Peak Systolic Velocity (cm/s) Ratio (if abnormal) Waveform  Peak Systolic Velocity (cm/s) Ratio (if abnormal) Waveform  175  T Inflow Artery 156  B  160  T Proximal Anastomosis 217  B  61  T Proximal Graft 118  B  56  T Mid Graft 46  B  51  T  Distal Graft 37  B  50  T Distal Anastomosis 96  B  81  B Outflow Artery 99  B  0.98 Today's ABI / TBI 1.01  0.73 Previous ABI / TBI ( 09/04/13 ) 0.91    Waveform:    M - Monophasic       B - Biphasic       T - Triphasic  If Ankle Brachial Index (ABI) or Toe Brachial Index (TBI) performed, please see complete report     ADDITIONAL FINDINGS:     IMPRESSION: 1. Patent bilateral femoral to popliteal bypass grafts with no stenosis visualized.    Compared to the previous exam:  None    ASSESSMENT: [name] is a 80 y.o. female who is s/p right femoral to above knee popliteal bypass graft 2005; left femoral to below knee popliteal bypass graft 01/2009 with thrombectomy of the iliac artery. ABI's improved to normal, pain in legs is not related to peripheral artery disease, most likely OA related, fluid at posterior aspect of right knee may be a Baker's cyst. Patent bilateral femoral to popliteal bypass grafts with no stenosis visualized.   PLAN:  I discussed in depth with the patient the nature of atherosclerosis, and emphasized the importance of maximal medical management including strict control of blood pressure, blood glucose, and lipid levels, obtaining regular exercise, and continued cessation of smoking.  The patient is aware that without maximal medical management the underlying atherosclerotic disease process will progress, limiting the benefit of any interventions.  Based on the patient's vascular studies and examination, pt will return to clinic in 1 year for ABI's and bilateral LE arterial  Duplex.  The patient was given information about PAD including signs, symptoms, treatment, what symptoms should prompt the patient to seek immediate medical care, and risk reduction measures to take.  Clemon Chambers, RN, MSN, FNP-C [facility] Phone: [phone]  Clinic MD: Scot Dock  04/29/2014 10:12 AM

## 2014-04-29 NOTE — Patient Instructions (Signed)
Peripheral Vascular Disease Peripheral Vascular Disease (PVD), also called Peripheral Arterial Disease (PAD), is a circulation problem caused by cholesterol (atherosclerotic plaque) deposits in the arteries. PVD commonly occurs in the lower extremities (legs) but it can occur in other areas of the body, such as your arms. The cholesterol buildup in the arteries reduces blood flow which can cause pain and other serious problems. The presence of PVD can place a person at risk for Coronary Artery Disease (CAD).  CAUSES  Causes of PVD can be many. It is usually associated with more than one risk factor such as:   High Cholesterol.  Smoking.  Diabetes.  Lack of exercise or inactivity.  High blood pressure (hypertension).  Obesity.  Family history. SYMPTOMS   When the lower extremities are affected, patients with PVD may experience:  Leg pain with exertion or physical activity. This is called INTERMITTENT CLAUDICATION. This may present as cramping or numbness with physical activity. The location of the pain is associated with the level of blockage. For example, blockage at the abdominal level (distal abdominal aorta) may result in buttock or hip pain. Lower leg arterial blockage may result in calf pain.  As PVD becomes more severe, pain can develop with less physical activity.  In people with severe PVD, leg pain may occur at rest.  Other PVD signs and symptoms:  Leg numbness or weakness.  Coldness in the affected leg or foot, especially when compared to the other leg.  A change in leg color.  Patients with significant PVD are more prone to ulcers or sores on toes, feet or legs. These may take longer to heal or may reoccur. The ulcers or sores can become infected.  If signs and symptoms of PVD are ignored, gangrene may occur. This can result in the loss of toes or loss of an entire limb.  Not all leg pain is related to PVD. Other medical conditions can cause leg pain such  as:  Blood clots (embolism) or Deep Vein Thrombosis.  Inflammation of the blood vessels (vasculitis).  Spinal stenosis. DIAGNOSIS  Diagnosis of PVD can involve several different types of tests. These can include:  Pulse Volume Recording Method (PVR). This test is simple, painless and does not involve the use of X-rays. PVR involves measuring and comparing the blood pressure in the arms and legs. An ABI (Ankle-Brachial Index) is calculated. The normal ratio of blood pressures is 1. As this number becomes smaller, it indicates more severe disease.  < 0.95  indicates significant narrowing in one or more leg vessels.  <0.8 there will usually be pain in the foot, leg or buttock with exercise.  <0.4 will usually have pain in the legs at rest.  <0.25  usually indicates limb threatening PVD.  Doppler detection of pulses in the legs. This test is painless and checks to see if you have a pulses in your legs/feet.  A dye or contrast material (a substance that highlights the blood vessels so they show up on x-ray) may be given to help your caregiver better see the arteries for the following tests. The dye is eliminated from your body by the kidney's. Your caregiver may order blood work to check your kidney function and other laboratory values before the following tests are performed:  Magnetic Resonance Angiography (MRA). An MRA is a picture study of the blood vessels and arteries. The MRA machine uses a large magnet to produce images of the blood vessels.  Computed Tomography Angiography (CTA). A CTA is a   specialized x-ray that looks at how the blood flows in your blood vessels. An IV may be inserted into your arm so contrast dye can be injected.  Angiogram. Is a procedure that uses x-rays to look at your blood vessels. This procedure is minimally invasive, meaning a small incision (cut) is made in your groin. A small tube (catheter) is then inserted into the artery of your groin. The catheter is  guided to the blood vessel or artery your caregiver wants to examine. Contrast dye is injected into the catheter. X-rays are then taken of the blood vessel or artery. After the images are obtained, the catheter is taken out. TREATMENT  Treatment of PVD involves many interventions which may include:  Lifestyle changes:  Quitting smoking.  Exercise.  Following a low fat, low cholesterol diet.  Control of diabetes.  Foot care is very important to the PVD patient. Good foot care can help prevent infection.  Medication:  Cholesterol-lowering medicine.  Blood pressure medicine.  Anti-platelet drugs.  Certain medicines may reduce symptoms of Intermittent Claudication.  Interventional/Surgical options:  Angioplasty. An Angioplasty is a procedure that inflates a balloon in the blocked artery. This opens the blocked artery to improve blood flow.  Stent Implant. A wire mesh tube (stent) is placed in the artery. The stent expands and stays in place, allowing the artery to remain open.  Peripheral Bypass Surgery. This is a surgical procedure that reroutes the blood around a blocked artery to help improve blood flow. This type of procedure may be performed if Angioplasty or stent implants are not an option. SEEK IMMEDIATE MEDICAL CARE IF:   You develop pain or numbness in your arms or legs.  Your arm or leg turns cold, becomes blue in color.  You develop redness, warmth, swelling and pain in your arms or legs. MAKE SURE YOU:   Understand these instructions.  Will watch your condition.  Will get help right away if you are not doing well or get worse. Document Released: 12/28/2004 Document Revised: 02/12/2012 Document Reviewed: 11/24/2008 [employer] Patient Information 2014 [employer].  

## 2014-05-01 ENCOUNTER — Encounter (HOSPITAL_COMMUNITY): Payer: PRIVATE HEALTH INSURANCE

## 2014-05-01 ENCOUNTER — Other Ambulatory Visit (HOSPITAL_COMMUNITY): Payer: PRIVATE HEALTH INSURANCE

## 2014-05-01 ENCOUNTER — Ambulatory Visit: Payer: PRIVATE HEALTH INSURANCE | Admitting: Family

## 2014-05-12 ENCOUNTER — Ambulatory Visit (INDEPENDENT_AMBULATORY_CARE_PROVIDER_SITE_OTHER): Payer: PRIVATE HEALTH INSURANCE

## 2014-05-12 ENCOUNTER — Ambulatory Visit (INDEPENDENT_AMBULATORY_CARE_PROVIDER_SITE_OTHER): Payer: PRIVATE HEALTH INSURANCE | Admitting: Orthopedic Surgery

## 2014-05-12 VITALS — Ht 60.0 in | Wt 146.0 lb

## 2014-05-12 DIAGNOSIS — M5410 Radiculopathy, site unspecified: Secondary | ICD-10-CM

## 2014-05-12 DIAGNOSIS — M419 Scoliosis, unspecified: Secondary | ICD-10-CM

## 2014-05-12 DIAGNOSIS — IMO0002 Reserved for concepts with insufficient information to code with codable children: Secondary | ICD-10-CM

## 2014-05-12 DIAGNOSIS — M5137 Other intervertebral disc degeneration, lumbosacral region: Secondary | ICD-10-CM

## 2014-05-12 DIAGNOSIS — M4120 Other idiopathic scoliosis, site unspecified: Secondary | ICD-10-CM

## 2014-05-12 MED ORDER — HYDROCODONE-ACETAMINOPHEN 7.5-325 MG PO TABS: 1.0000 | ORAL_TABLET | Freq: Four times a day (QID) | ORAL | Status: DC | PRN

## 2014-05-12 MED ORDER — GABAPENTIN 100 MG PO CAPS
100.0000 mg | ORAL_CAPSULE | Freq: Three times a day (TID) | ORAL | Status: DC
Start: 2014-05-12 — End: 2014-06-15

## 2014-05-12 NOTE — Progress Notes (Signed)
Patient ID: [name], female   DOB: [birth year], 80 y.o.   MRN: [id number] New problem established patient  Chief complaint pain right leg and both legs and feet  88 are female started having pain in her right hip radiating down the back of her right leg and difficulty walking. She sought medical care from her vascular surgeon to check her inflow and found no problems. She complained of right knee pain at that time and was sent here for further evaluation. Today she is complaining of bilateral foot pain with numbness and tingling in her feet and lower ankles and some discoloration in the skin around the lower leg. Her pain in the hip has for the most part resolved she still has a numbness and tingling in the ankles and legs  She has no giving way or weakness or instability in the right lower extremity at this time skin changes as stated numbness and tingling as stated no bruising no unexpected weight loss  She is awake alert and oriented x3 mood and affect are normal her overall body habitus is small in terms of BMI and height weight ratio. Ht 5' (1.524 m)  Wt 146 lb (66.225 kg)  BMI 28.51 kg/m2 She looks good she walks normally.  Just discoloration the skin of both legs consistent with venous stasis type disease. She has good strength in both lower extremities and no instability range of motion is normal at the hip joints slightly decreased at the knee joints no swelling tenderness or pain in either knee  Has good distal pulses no lymphadenopathy decreased sensation but normal sharp touch sensation no pathologic reflexes  X-rays show severe degenerative scoliosis and disc disease in the lumbar spine  Her right lower Jimmy the pain was probably radicular in nature this resolved  She still has venous stasis disease with skin changes consistent with that as well as probable neuropathy secondary to her borderline diabetes  Recommend gabapentin and followup with primary care doctor for  further evaluation of the neuropathy.

## 2014-05-12 NOTE — Patient Instructions (Signed)
See Dr. Criss Rosales for follow up

## 2014-06-15 ENCOUNTER — Other Ambulatory Visit: Payer: Self-pay | Admitting: *Deleted

## 2014-06-15 DIAGNOSIS — M5137 Other intervertebral disc degeneration, lumbosacral region: Secondary | ICD-10-CM

## 2014-06-15 DIAGNOSIS — M419 Scoliosis, unspecified: Secondary | ICD-10-CM

## 2014-06-15 MED ORDER — GABAPENTIN 100 MG PO CAPS: 100.0000 mg | ORAL_CAPSULE | Freq: Three times a day (TID) | ORAL | Status: DC

## 2014-06-30 ENCOUNTER — Telehealth: Payer: Self-pay | Admitting: Orthopedic Surgery

## 2014-06-30 DIAGNOSIS — M5137 Other intervertebral disc degeneration, lumbosacral region: Secondary | ICD-10-CM

## 2014-06-30 DIAGNOSIS — M419 Scoliosis, unspecified: Secondary | ICD-10-CM

## 2014-06-30 MED ORDER — HYDROCODONE-ACETAMINOPHEN 7.5-325 MG PO TABS: 1.0000 | ORAL_TABLET | Freq: Four times a day (QID) | ORAL | Status: DC | PRN

## 2014-06-30 NOTE — Telephone Encounter (Signed)
Routing to Dr Harrison 

## 2014-06-30 NOTE — Telephone Encounter (Signed)
Prescription available for pick up, patient aware

## 2014-06-30 NOTE — Telephone Encounter (Signed)
refill 

## 2014-06-30 NOTE — Telephone Encounter (Signed)
[name] wants a prescription for Hydrocodone 7.5/325

## 2014-07-01 NOTE — Telephone Encounter (Signed)
Prescription picked up by the patient

## 2014-08-11 ENCOUNTER — Other Ambulatory Visit: Payer: Self-pay | Admitting: *Deleted

## 2014-08-11 ENCOUNTER — Other Ambulatory Visit: Payer: Self-pay

## 2014-08-11 ENCOUNTER — Telehealth: Payer: Self-pay | Admitting: Orthopedic Surgery

## 2014-08-11 ENCOUNTER — Encounter: Payer: Self-pay | Admitting: Vascular Surgery

## 2014-08-11 DIAGNOSIS — I739 Peripheral vascular disease, unspecified: Secondary | ICD-10-CM

## 2014-08-11 DIAGNOSIS — M419 Scoliosis, unspecified: Secondary | ICD-10-CM

## 2014-08-11 DIAGNOSIS — M79606 Pain in leg, unspecified: Secondary | ICD-10-CM

## 2014-08-11 DIAGNOSIS — M5137 Other intervertebral disc degeneration, lumbosacral region: Secondary | ICD-10-CM

## 2014-08-11 DIAGNOSIS — Z48812 Encounter for surgical aftercare following surgery on the circulatory system: Secondary | ICD-10-CM

## 2014-08-11 MED ORDER — HYDROCODONE-ACETAMINOPHEN 7.5-325 MG PO TABS: 1.0000 | ORAL_TABLET | Freq: Four times a day (QID) | ORAL | Status: DC | PRN

## 2014-08-11 NOTE — Telephone Encounter (Signed)
Prescription available for pick up, called patient, no answer 

## 2014-08-11 NOTE — Telephone Encounter (Signed)
[name] wants a prescription for Hydrocodone 7.5/325

## 2014-08-25 ENCOUNTER — Encounter: Payer: Self-pay | Admitting: Vascular Surgery

## 2014-08-26 ENCOUNTER — Ambulatory Visit (HOSPITAL_COMMUNITY)
Admission: RE | Admit: 2014-08-26 | Discharge: 2014-08-26 | Disposition: A | Discharge status: Home / Self Care | Payer: PRIVATE HEALTH INSURANCE | Source: Ambulatory Visit | Attending: Vascular Surgery | Admitting: Vascular Surgery

## 2014-08-26 ENCOUNTER — Encounter: Payer: Self-pay | Admitting: Vascular Surgery

## 2014-08-26 ENCOUNTER — Ambulatory Visit (INDEPENDENT_AMBULATORY_CARE_PROVIDER_SITE_OTHER)
Admission: RE | Admit: 2014-08-26 | Discharge: 2014-08-26 | Disposition: A | Discharge status: Home / Self Care | Payer: PRIVATE HEALTH INSURANCE | Source: Ambulatory Visit | Attending: Vascular Surgery | Admitting: Vascular Surgery

## 2014-08-26 ENCOUNTER — Ambulatory Visit (INDEPENDENT_AMBULATORY_CARE_PROVIDER_SITE_OTHER): Payer: PRIVATE HEALTH INSURANCE | Admitting: Vascular Surgery

## 2014-08-26 VITALS — BP 145/72 | HR 80 | Temp 97.1°F | Resp 16 | Ht 60.0 in | Wt 143.0 lb

## 2014-08-26 DIAGNOSIS — M79609 Pain in unspecified limb: Secondary | ICD-10-CM | POA: Diagnosis not present

## 2014-08-26 DIAGNOSIS — I739 Peripheral vascular disease, unspecified: Secondary | ICD-10-CM

## 2014-08-26 DIAGNOSIS — M79606 Pain in leg, unspecified: Secondary | ICD-10-CM

## 2014-08-26 DIAGNOSIS — Z48812 Encounter for surgical aftercare following surgery on the circulatory system: Secondary | ICD-10-CM

## 2014-08-26 NOTE — Addendum Note (Signed)
Addended by: Mena Goes on: 08/26/2014 05:25 PM   Modules accepted: Orders

## 2014-08-26 NOTE — Progress Notes (Signed)
[facility] [place] HISTORY [facility]YSICAL   History of Present Illness:  Patient is a 80 y.o. year old female who presents for evaluation of right leg pain.  She is on Plavix/Aspirin for antiplatelet therapy. Her atherosclerotic risk factors remain elevated cholesterol, hypertension.  These are all currently stable and followed by her primary care physician.  The patient currently has no claudication symptoms.  The patient denies rest pain or ulcers on the feet.  She does complain of a cramping-type sensation occurs in the anterior tibial portion of her right leg at night time. She does not really describe any cramping in the calves. These pain occurs at rest not with activity. The patient has previously undergone multiple revascularizations of both lower extremities. She had a left femoropopliteal bypass done by Dr. Nils Pyle several years ago. She underwent thrombectomy of her left external artery and redo left femoral to below-knee popliteal bypass with a vein graft in February 2010 by me. She also had an angioplasty of this in May of 2010 by Dr Trula Slade. She's also previously had a right femoro above-knee popliteal bypass by Dr. Nils Pyle. This has one-vessel runoff via anterior tibial artery on her most recent arteriogram.  She does walk but is not walking much more than around the house due to the fact she states she just gets tired. She currently takes Vicodin intermittently for the pain.       Past Medical History   Diagnosis  Date   .  HTN (hypertension)     .  Rotator cuff insufficiency  03/23/2011   .  Arthritis     .  Hyperlipidemia     .  Thyroid disease     .  Hypothyroidism         Past Surgical History   Procedure  Laterality  Date   .  Vesicovaginal fistula closure w/ tah       .  Carpal tunnel release           right side   .  Tonsillectomy       .  Cholecystectomy       .  Left shoulder hemiarthroplasty       .  Appendectomy       .  Grafts of bilateral  lower extremities       .  Colon surgery           sigmoid colectomy complicated by anastomotic leak   .  Colostomy    transverse loop, 2006   .  Abdominal hysterectomy       .  Cataract extraction w/phaco  Right  06/05/2013       Procedure: CATARACT EXTRACTION PHACO AND INTRAOCULAR LENS PLACEMENT (IOC);  Surgeon: Tonny Branch, MD;  Location: [facility];  Service: Ophthalmology;  Laterality: Right;  CDE: 10.58   .  Cataract extraction w/phaco  Left  07/10/2013       Procedure: CATARACT EXTRACTION PHACO AND INTRAOCULAR LENS PLACEMENT (IOC);  Surgeon: Tonny Branch, MD;  Location: [facility];  Service: Ophthalmology;  Laterality: Left;  CDE: 17.35     Review of Systems:  Neurologic: sensation in the feet is intact Cardiac:denies shortness of breath or chest pain Pulmonary: denies cough or wheeze  Social History History    Substance Use Topics    .   Smoking status:   Never Smoker     .   Smokeless tobacco:   Never Used    .   Alcohol  Use:   No      Allergies  No Known Allergies    Current Outpatient Prescriptions on File Prior to Visit  Medication Sig Dispense Refill  . amLODipine (NORVASC) 10 MG tablet Take 10 mg by mouth daily.      . Amlodipine-Valsartan-HCTZ (EXFORGE HCT) 5-160-12.5 MG TABS Take 1 tablet by mouth daily.      Marland Kitchen aspirin 81 MG tablet Take 81 mg by mouth daily.      . diclofenac (FLECTOR) 1.3 % PTCH Place 1 patch onto the skin 2 (two) times daily.      Marland Kitchen escitalopram (LEXAPRO) 5 MG tablet Take 5 mg by mouth daily.      . ferrous sulfate 325 (65 FE) MG tablet Take 325 mg by mouth daily with breakfast.      . gabapentin (NEURONTIN) 100 MG capsule Take 1 capsule (100 mg total) by mouth 3 (three) times daily.  90 capsule  5  . HYDROcodone-acetaminophen (NORCO) 7.5-325 MG per tablet Take 1 tablet by mouth every 6 (six) hours as needed.  120 tablet  0  . levothyroxine (SYNTHROID, LEVOTHROID) 75 MCG tablet Take 75 mcg by mouth daily.        . methocarbamol (ROBAXIN) 500 MG tablet Take  1 tablet (500 mg total) by mouth every 8 (eight) hours as needed for muscle spasms.  30 tablet  0  . Multiple Vitamin (MULTIVITAMIN WITH MINERALS) TABS Take 1 tablet by mouth daily.      . mupirocin ointment (BACTROBAN) 2 % Place 1 application into the nose 3 (three) times daily.      . niacin (NIASPAN) 500 MG CR tablet Take 500 mg by mouth at bedtime.      . predniSONE (DELTASONE) 10 MG tablet Take 10 mg by mouth 2 (two) times daily.      . cefUROXime (CEFTIN) 250 MG tablet Take 250 mg by mouth 2 (two) times daily with a meal.       No current facility-administered medications on file prior to visit.    Physical Examination  Filed Vitals:   08/26/14 1550  BP: 145/72  Pulse: 80  Temp: 97.1 F (36.2 C)  TempSrc: Oral  Resp: 16  Height: 5' (1.524 m)  Weight: 143 lb (64.864 kg)  SpO2: 99%   General:  Alert and oriented, no acute distress HEENT: Normal Neurologic: Upper and lower extremity motor 5/5 and symmetric Extremities:  2+ femoral popliteal pulses 2+ popliteal pulse left leg absent right popliteal pulse o Skin: no ulcer or rash  DATA: She had bilateral graft duplex scans today which showed that both bypasses are patent. I reviewed and interpreted her study. ABIs today were 0.8 on the right 0.89 on the left biphasic to triphasic waveforms.  Prior ABIs were right 0.87 on the left 0.73 several years ago  ASSESSMENT: Patent bilateral lower extremity bypass grafts with no significant change in her lower extremity perfusion   PLAN:  Pain right anterior compartment muscles of leg. No symptoms of claudication rest pain or nonhealing wounds.  She has well-perfused feet with patent bypass at this time. She will have a followup graft duplex scan 3 months to make sure she has had no interval change. We will also repeat her ABIs at that time. Most likely this is neurologic or musculoskeletal in origin. She'll continue to take the Vicodin intermittently. I also told her she could try just  extract Tylenol if this was enough pain control. Of note she is not  interested in any intervention so much she had a limb threatening process which she certainly does not have currently.  Ruta Hinds, MD [facility] Office: [phone] Pager: [phone]

## 2014-09-10 ENCOUNTER — Ambulatory Visit: Payer: PRIVATE HEALTH INSURANCE | Admitting: Family

## 2014-09-10 ENCOUNTER — Encounter (HOSPITAL_COMMUNITY): Payer: PRIVATE HEALTH INSURANCE

## 2014-09-10 ENCOUNTER — Other Ambulatory Visit (HOSPITAL_COMMUNITY): Payer: PRIVATE HEALTH INSURANCE

## 2014-09-24 ENCOUNTER — Telehealth: Payer: Self-pay | Admitting: Orthopedic Surgery

## 2014-09-24 ENCOUNTER — Other Ambulatory Visit: Payer: Self-pay | Admitting: *Deleted

## 2014-09-24 DIAGNOSIS — M5137 Other intervertebral disc degeneration, lumbosacral region: Secondary | ICD-10-CM

## 2014-09-24 DIAGNOSIS — M419 Scoliosis, unspecified: Secondary | ICD-10-CM

## 2014-09-24 DIAGNOSIS — M51379 Other intervertebral disc degeneration, lumbosacral region without mention of lumbar back pain or lower extremity pain: Secondary | ICD-10-CM

## 2014-09-24 MED ORDER — HYDROCODONE-ACETAMINOPHEN 7.5-325 MG PO TABS: 1.0000 | ORAL_TABLET | Freq: Four times a day (QID) | ORAL | Status: DC | PRN

## 2014-09-24 NOTE — Telephone Encounter (Signed)
Prescription available for pick up , called patient, not available

## 2014-09-24 NOTE — Telephone Encounter (Signed)
Patient is asking for a refill on her pain medication HYDROcodone-acetaminophen (NORCO) 7.5-325 MG per tablet please advise?

## 2014-11-30 ENCOUNTER — Other Ambulatory Visit: Payer: Self-pay | Admitting: *Deleted

## 2014-11-30 ENCOUNTER — Telehealth: Payer: Self-pay | Admitting: Orthopedic Surgery

## 2014-11-30 DIAGNOSIS — M419 Scoliosis, unspecified: Secondary | ICD-10-CM

## 2014-11-30 DIAGNOSIS — M5137 Other intervertebral disc degeneration, lumbosacral region: Secondary | ICD-10-CM

## 2014-11-30 MED ORDER — HYDROCODONE-ACETAMINOPHEN 7.5-325 MG PO TABS: 1.0000 | ORAL_TABLET | Freq: Four times a day (QID) | ORAL | Status: DC | PRN

## 2014-11-30 NOTE — Telephone Encounter (Signed)
Patient picked up Rx

## 2014-11-30 NOTE — Telephone Encounter (Signed)
Prescription available, patient aware  

## 2014-11-30 NOTE — Telephone Encounter (Signed)
Patient is calling asking for a refill on pain medication HYDROcodone-acetaminophen (NORCO) 7.5-325 MG per tablet [id number] please advise?

## 2014-12-03 ENCOUNTER — Encounter (HOSPITAL_COMMUNITY): Payer: PRIVATE HEALTH INSURANCE

## 2014-12-03 ENCOUNTER — Ambulatory Visit: Payer: PRIVATE HEALTH INSURANCE | Admitting: Vascular Surgery

## 2014-12-03 ENCOUNTER — Other Ambulatory Visit (HOSPITAL_COMMUNITY): Payer: PRIVATE HEALTH INSURANCE

## 2014-12-10 ENCOUNTER — Ambulatory Visit: Payer: PRIVATE HEALTH INSURANCE | Admitting: Vascular Surgery

## 2014-12-10 ENCOUNTER — Ambulatory Visit (HOSPITAL_COMMUNITY)
Admission: RE | Admit: 2014-12-10 | Discharge: 2014-12-10 | Disposition: A | Discharge status: Home / Self Care | Payer: Medicare Other | Source: Ambulatory Visit | Attending: Vascular Surgery | Admitting: Vascular Surgery

## 2014-12-10 ENCOUNTER — Ambulatory Visit (INDEPENDENT_AMBULATORY_CARE_PROVIDER_SITE_OTHER)
Admission: RE | Admit: 2014-12-10 | Discharge: 2014-12-10 | Disposition: A | Discharge status: Home / Self Care | Payer: Medicare Other | Source: Ambulatory Visit | Attending: Vascular Surgery | Admitting: Vascular Surgery

## 2014-12-10 DIAGNOSIS — I739 Peripheral vascular disease, unspecified: Secondary | ICD-10-CM

## 2014-12-10 DIAGNOSIS — Z48812 Encounter for surgical aftercare following surgery on the circulatory system: Secondary | ICD-10-CM

## 2014-12-23 ENCOUNTER — Encounter: Payer: Self-pay | Admitting: Vascular Surgery

## 2014-12-24 ENCOUNTER — Encounter: Payer: Self-pay | Admitting: Vascular Surgery

## 2014-12-24 ENCOUNTER — Ambulatory Visit (INDEPENDENT_AMBULATORY_CARE_PROVIDER_SITE_OTHER): Payer: Medicare Other | Admitting: Vascular Surgery

## 2014-12-24 VITALS — BP 111/77 | HR 93 | Ht 60.0 in | Wt 137.8 lb

## 2014-12-24 DIAGNOSIS — I739 Peripheral vascular disease, unspecified: Secondary | ICD-10-CM

## 2014-12-24 NOTE — Progress Notes (Signed)
[facility] [place] HISTORY AND PHYSICAL   History of Present Illness:  Patient is a 81 y.o. year old female who presents for evaluation of right leg pain.  She is on Plavix/Aspirin for antiplatelet therapy. Her atherosclerotic risk factors remain elevated cholesterol, hypertension.  These are all currently stable and followed by her primary care physician.  The patient currently has no claudication symptoms.  The patient denies rest pain or ulcers on the feet.  She does complain of a cramping-type sensation occurs in the anterior tibial portion of her right leg at night time. She does not really describe any cramping in the calves. The pain occurs at rest not with activity. The patient has previously undergone multiple revascularizations of both lower extremities. She had a left femoropopliteal bypass done by Dr. Nils Pyle several years ago. She underwent thrombectomy of her left external artery and redo left femoral to below-knee popliteal bypass with a vein graft in February 2010 by me. She also had an angioplasty of this in May of 2010 by Dr Trula Slade. She's also previously had a right femoro above-knee popliteal bypass by Dr. Nils Pyle. This has one-vessel runoff via anterior tibial artery on her most recent arteriogram.  She does walk but is not walking much more than around the house due to the fact she states she just gets tired. She currently takes Vicodin intermittently for the pain.       Past Medical History    Diagnosis   Date    .   HTN (hypertension)       .   Rotator cuff insufficiency   03/23/2011    .   Arthritis       .   Hyperlipidemia       .   Thyroid disease       .   Hypothyroidism           Past Surgical History    Procedure   Laterality   Date    .   Vesicovaginal fistula closure w/ tah          .   Carpal tunnel release                right side    .   Tonsillectomy          .   Cholecystectomy          .   Left shoulder hemiarthroplasty          .    Appendectomy          .   Grafts of bilateral lower extremities          .   Colon surgery                sigmoid colectomy complicated by anastomotic leak    .   Colostomy      transverse loop, 2006    .   Abdominal hysterectomy          .   Cataract extraction w/phaco   Right   06/05/2013          Procedure: CATARACT EXTRACTION PHACO AND INTRAOCULAR LENS PLACEMENT (IOC);  Surgeon: Tonny Branch, MD;  Location: [facility];  Service: Ophthalmology;  Laterality: Right;  CDE: 10.58    .   Cataract extraction w/phaco   Left   07/10/2013          Procedure: CATARACT EXTRACTION PHACO AND INTRAOCULAR LENS PLACEMENT (IOC);  Surgeon: Levada Dy  Geoffry Paradise, MD;  Location: [facility];  Service: Ophthalmology;  Laterality: Left;  CDE: 17.35      Review of Systems:  Neurologic: sensation in the feet is intact Cardiac:denies shortness of breath or chest pain Pulmonary: denies cough or wheeze  Social History History     Substance Use Topics     .    Smoking status:    Never Smoker      .    Smokeless tobacco:    Never Used     .    Alcohol Use:    No       Allergies  No Known Allergies    Current Outpatient Prescriptions on File Prior to Visit   Medication  Sig  Dispense  Refill   .  amLODipine (NORVASC) 10 MG tablet  Take 10 mg by mouth daily.         .  Amlodipine-Valsartan-HCTZ (EXFORGE HCT) 5-160-12.5 MG TABS  Take 1 tablet by mouth daily.         Marland Kitchen  aspirin 81 MG tablet  Take 81 mg by mouth daily.         .  diclofenac (FLECTOR) 1.3 % PTCH  Place 1 patch onto the skin 2 (two) times daily.         Marland Kitchen  escitalopram (LEXAPRO) 5 MG tablet  Take 5 mg by mouth daily.         .  ferrous sulfate 325 (65 FE) MG tablet  Take 325 mg by mouth daily with breakfast.         .  gabapentin (NEURONTIN) 100 MG capsule  Take 1 capsule (100 mg total) by mouth 3 (three) times daily.   90 capsule   5   .  HYDROcodone-acetaminophen (NORCO) 7.5-325 MG per tablet  Take 1 tablet by mouth every 6 (six) hours as needed.   120 tablet   0    .  levothyroxine (SYNTHROID, LEVOTHROID) 75 MCG tablet  Take 75 mcg by mouth daily.           .  methocarbamol (ROBAXIN) 500 MG tablet  Take 1 tablet (500 mg total) by mouth every 8 (eight) hours as needed for muscle spasms.   30 tablet   0   .  Multiple Vitamin (MULTIVITAMIN WITH MINERALS) TABS  Take 1 tablet by mouth daily.         .  mupirocin ointment (BACTROBAN) 2 %  Place 1 application into the nose 3 (three) times daily.         .  niacin (NIASPAN) 500 MG CR tablet  Take 500 mg by mouth at bedtime.         .  predniSONE (DELTASONE) 10 MG tablet  Take 10 mg by mouth 2 (two) times daily.         .  cefUROXime (CEFTIN) 250 MG tablet  Take 250 mg by mouth 2 (two) times daily with a meal.            No current facility-administered medications on file prior to visit.     Physical Examination    Filed Vitals:   12/24/14 1548  BP: 111/77  Pulse: 93  Height: 5' (1.524 m)  Weight: 137 lb 12.8 oz (62.506 kg)  SpO2: 100%    General:  Alert and oriented, no acute distress HEENT: Normal Neurologic: Upper and lower extremity motor 5/5 and symmetric Extremities:  2+ femoral popliteal pulses 2+ popliteal pulse left leg absent right  popliteal pulse absent pedal pulses Skin: no ulcer or rash  DATA: She had bilateral graft duplex scans today which showed that both bypasses are patent. I reviewed and interpreted her study. ABIs today were 0.8 on the right 0.93 on the left biphasic to triphasic waveforms.  Prior ABIs were right 0.87 on the left 0.73 several years ago  ASSESSMENT: Patent bilateral lower extremity bypass grafts with no significant change in her lower extremity perfusion   PLAN:  Pain right anterior compartment muscles of leg. No symptoms of claudication rest pain or nonhealing wounds.  She has well-perfused feet with patent bypass at this time. Most likely this is neurologic or musculoskeletal in origin. She has had no significant change in her ABIs over the last several years.  She has patent bypasses on duplex exam. She'll continue to take the Vicodin intermittently. I also told her she could try just extra strength Tylenol if this was enough pain control. Of note she is not interested in any intervention so much she had a limb threatening process which she certainly does not have currently.  Ruta Hinds, MD [facility] [place] Office: [phone] Pager: [phone]

## 2014-12-24 NOTE — Addendum Note (Signed)
Addended by: Mena Goes on: 12/24/2014 04:33 PM   Modules accepted: Orders

## 2015-01-05 DIAGNOSIS — Z0000 Encounter for general adult medical examination without abnormal findings: Secondary | ICD-10-CM | POA: Diagnosis not present

## 2015-01-06 DIAGNOSIS — Z933 Colostomy status: Secondary | ICD-10-CM | POA: Diagnosis not present

## 2015-01-19 ENCOUNTER — Telehealth: Payer: Self-pay | Admitting: Orthopedic Surgery

## 2015-01-19 ENCOUNTER — Other Ambulatory Visit: Payer: Self-pay | Admitting: *Deleted

## 2015-01-19 DIAGNOSIS — M419 Scoliosis, unspecified: Secondary | ICD-10-CM

## 2015-01-19 DIAGNOSIS — M5137 Other intervertebral disc degeneration, lumbosacral region: Secondary | ICD-10-CM

## 2015-01-19 MED ORDER — HYDROCODONE-ACETAMINOPHEN 7.5-325 MG PO TABS: 1.0000 | ORAL_TABLET | Freq: Four times a day (QID) | ORAL | Status: DC | PRN

## 2015-01-19 NOTE — Telephone Encounter (Signed)
Prescription available, called patient, no answer

## 2015-01-19 NOTE — Telephone Encounter (Signed)
Patient is calling requesting a refill on pain medication HYDROcodone-acetaminophen (NORCO) 7.5-325 MG per tablet [id number] please advise

## 2015-01-21 NOTE — Telephone Encounter (Signed)
Patient picked up Rx

## 2015-03-09 ENCOUNTER — Telehealth: Payer: Self-pay | Admitting: Orthopedic Surgery

## 2015-03-09 NOTE — Telephone Encounter (Signed)
Patient called and requested a refill on pain medication HYDROcodone-acetaminophen (NORCO) 7.5-325 MG per tablet  Please advise?

## 2015-03-10 ENCOUNTER — Other Ambulatory Visit: Payer: Self-pay | Admitting: *Deleted

## 2015-03-10 DIAGNOSIS — M5137 Other intervertebral disc degeneration, lumbosacral region: Secondary | ICD-10-CM

## 2015-03-10 DIAGNOSIS — Z933 Colostomy status: Secondary | ICD-10-CM | POA: Diagnosis not present

## 2015-03-10 DIAGNOSIS — M419 Scoliosis, unspecified: Secondary | ICD-10-CM

## 2015-03-10 MED ORDER — HYDROCODONE-ACETAMINOPHEN 7.5-325 MG PO TABS: 1.0000 | ORAL_TABLET | Freq: Four times a day (QID) | ORAL | Status: DC | PRN

## 2015-03-10 NOTE — Telephone Encounter (Signed)
Prescription is available, no answer for the patient

## 2015-03-11 DIAGNOSIS — R7309 Other abnormal glucose: Secondary | ICD-10-CM | POA: Diagnosis not present

## 2015-03-11 DIAGNOSIS — I10 Essential (primary) hypertension: Secondary | ICD-10-CM | POA: Diagnosis not present

## 2015-03-11 DIAGNOSIS — N289 Disorder of kidney and ureter, unspecified: Secondary | ICD-10-CM | POA: Diagnosis not present

## 2015-03-11 DIAGNOSIS — M159 Polyosteoarthritis, unspecified: Secondary | ICD-10-CM | POA: Diagnosis not present

## 2015-03-11 DIAGNOSIS — K9400 Colostomy complication, unspecified: Secondary | ICD-10-CM | POA: Diagnosis not present

## 2015-03-11 DIAGNOSIS — E1140 Type 2 diabetes mellitus with diabetic neuropathy, unspecified: Secondary | ICD-10-CM | POA: Diagnosis not present

## 2015-03-11 NOTE — Telephone Encounter (Signed)
Patient picked up Rx

## 2015-03-15 DIAGNOSIS — Z433 Encounter for attention to colostomy: Secondary | ICD-10-CM | POA: Diagnosis not present

## 2015-04-21 ENCOUNTER — Telehealth: Payer: Self-pay | Admitting: Orthopedic Surgery

## 2015-04-21 ENCOUNTER — Other Ambulatory Visit: Payer: Self-pay | Admitting: *Deleted

## 2015-04-21 DIAGNOSIS — M419 Scoliosis, unspecified: Secondary | ICD-10-CM

## 2015-04-21 DIAGNOSIS — M5137 Other intervertebral disc degeneration, lumbosacral region: Secondary | ICD-10-CM

## 2015-04-21 MED ORDER — HYDROCODONE-ACETAMINOPHEN 7.5-325 MG PO TABS: 1.0000 | ORAL_TABLET | Freq: Four times a day (QID) | ORAL | Status: DC | PRN

## 2015-04-21 NOTE — Telephone Encounter (Signed)
Patient called for refill of pain medication:  HYDROcodone-acetaminophen (NORCO) 7.5-325 MG per tablet [id number]  - her ph# is [phone]

## 2015-04-22 NOTE — Telephone Encounter (Signed)
Patient pick up Rx

## 2015-05-05 ENCOUNTER — Other Ambulatory Visit (HOSPITAL_COMMUNITY): Payer: PRIVATE HEALTH INSURANCE

## 2015-05-05 ENCOUNTER — Encounter (HOSPITAL_COMMUNITY): Payer: PRIVATE HEALTH INSURANCE

## 2015-05-05 ENCOUNTER — Ambulatory Visit: Payer: PRIVATE HEALTH INSURANCE | Admitting: Family

## 2015-05-06 ENCOUNTER — Other Ambulatory Visit: Payer: Self-pay | Admitting: *Deleted

## 2015-05-06 DIAGNOSIS — M5137 Other intervertebral disc degeneration, lumbosacral region: Secondary | ICD-10-CM

## 2015-05-06 DIAGNOSIS — M419 Scoliosis, unspecified: Secondary | ICD-10-CM

## 2015-05-06 MED ORDER — GABAPENTIN 100 MG PO CAPS: 100.0000 mg | ORAL_CAPSULE | Freq: Three times a day (TID) | ORAL | Status: DC

## 2015-05-18 DIAGNOSIS — E780 Pure hypercholesterolemia: Secondary | ICD-10-CM | POA: Diagnosis not present

## 2015-05-18 DIAGNOSIS — E1122 Type 2 diabetes mellitus with diabetic chronic kidney disease: Secondary | ICD-10-CM | POA: Diagnosis not present

## 2015-05-18 DIAGNOSIS — M159 Polyosteoarthritis, unspecified: Secondary | ICD-10-CM | POA: Diagnosis not present

## 2015-05-18 DIAGNOSIS — J398 Other specified diseases of upper respiratory tract: Secondary | ICD-10-CM | POA: Diagnosis not present

## 2015-05-18 DIAGNOSIS — R7309 Other abnormal glucose: Secondary | ICD-10-CM | POA: Diagnosis not present

## 2015-05-25 DIAGNOSIS — R0689 Other abnormalities of breathing: Secondary | ICD-10-CM | POA: Diagnosis not present

## 2015-06-14 ENCOUNTER — Telehealth: Payer: Self-pay | Admitting: Orthopedic Surgery

## 2015-06-14 ENCOUNTER — Other Ambulatory Visit: Payer: Self-pay | Admitting: *Deleted

## 2015-06-14 DIAGNOSIS — M5137 Other intervertebral disc degeneration, lumbosacral region: Secondary | ICD-10-CM

## 2015-06-14 DIAGNOSIS — M419 Scoliosis, unspecified: Secondary | ICD-10-CM

## 2015-06-14 DIAGNOSIS — Z933 Colostomy status: Secondary | ICD-10-CM | POA: Diagnosis not present

## 2015-06-14 MED ORDER — HYDROCODONE-ACETAMINOPHEN 7.5-325 MG PO TABS: 1.0000 | ORAL_TABLET | Freq: Four times a day (QID) | ORAL | Status: DC | PRN

## 2015-06-14 NOTE — Telephone Encounter (Signed)
Patient called to request a medication refill on HYDROcodone-acetaminophen (NORCO) 7.5-325 MG per tablet please advise?

## 2015-06-14 NOTE — Telephone Encounter (Signed)
Prescription available, patient aware to pick up at 11:30 tomorrow

## 2015-06-15 NOTE — Telephone Encounter (Signed)
Patient picked up Rx

## 2015-07-05 DIAGNOSIS — E1140 Type 2 diabetes mellitus with diabetic neuropathy, unspecified: Secondary | ICD-10-CM | POA: Diagnosis not present

## 2015-07-05 DIAGNOSIS — N189 Chronic kidney disease, unspecified: Secondary | ICD-10-CM | POA: Diagnosis not present

## 2015-07-05 DIAGNOSIS — E039 Hypothyroidism, unspecified: Secondary | ICD-10-CM | POA: Diagnosis not present

## 2015-07-05 DIAGNOSIS — R634 Abnormal weight loss: Secondary | ICD-10-CM | POA: Diagnosis not present

## 2015-07-05 DIAGNOSIS — M159 Polyosteoarthritis, unspecified: Secondary | ICD-10-CM | POA: Diagnosis not present

## 2015-07-21 DIAGNOSIS — I10 Essential (primary) hypertension: Secondary | ICD-10-CM | POA: Diagnosis not present

## 2015-07-21 DIAGNOSIS — R634 Abnormal weight loss: Secondary | ICD-10-CM | POA: Diagnosis not present

## 2015-08-19 ENCOUNTER — Telehealth: Payer: Self-pay | Admitting: Orthopedic Surgery

## 2015-08-19 NOTE — Telephone Encounter (Signed)
Patient called for refill of medication: HYDROcodone-acetaminophen (NORCO) 7.5-325 MG per tablet [id number]  - [phone]

## 2015-08-20 ENCOUNTER — Other Ambulatory Visit: Payer: Self-pay | Admitting: *Deleted

## 2015-08-20 DIAGNOSIS — Z933 Colostomy status: Secondary | ICD-10-CM | POA: Diagnosis not present

## 2015-08-20 DIAGNOSIS — I10 Essential (primary) hypertension: Secondary | ICD-10-CM | POA: Diagnosis not present

## 2015-08-20 DIAGNOSIS — R634 Abnormal weight loss: Secondary | ICD-10-CM | POA: Diagnosis not present

## 2015-08-20 DIAGNOSIS — M419 Scoliosis, unspecified: Secondary | ICD-10-CM

## 2015-08-20 DIAGNOSIS — F330 Major depressive disorder, recurrent, mild: Secondary | ICD-10-CM | POA: Diagnosis not present

## 2015-08-20 DIAGNOSIS — M5137 Other intervertebral disc degeneration, lumbosacral region: Secondary | ICD-10-CM

## 2015-08-20 MED ORDER — HYDROCODONE-ACETAMINOPHEN 7.5-325 MG PO TABS: 1.0000 | ORAL_TABLET | Freq: Four times a day (QID) | ORAL | Status: DC | PRN

## 2015-08-23 NOTE — Telephone Encounter (Signed)
Patient picked up Rx

## 2015-08-23 NOTE — Telephone Encounter (Signed)
Prescription available, called patient, no answer

## 2015-08-23 NOTE — Telephone Encounter (Signed)
08/23/15 patient picked up prescription.  States husband just passed this morning.

## 2015-09-06 DIAGNOSIS — N289 Disorder of kidney and ureter, unspecified: Secondary | ICD-10-CM | POA: Diagnosis not present

## 2015-09-06 DIAGNOSIS — Z23 Encounter for immunization: Secondary | ICD-10-CM | POA: Diagnosis not present

## 2015-09-06 DIAGNOSIS — I10 Essential (primary) hypertension: Secondary | ICD-10-CM | POA: Diagnosis not present

## 2015-09-06 DIAGNOSIS — F4325 Adjustment disorder with mixed disturbance of emotions and conduct: Secondary | ICD-10-CM | POA: Diagnosis not present

## 2015-09-21 DIAGNOSIS — Z933 Colostomy status: Secondary | ICD-10-CM | POA: Diagnosis not present

## 2015-10-04 ENCOUNTER — Telehealth: Payer: Self-pay | Admitting: Orthopedic Surgery

## 2015-10-04 ENCOUNTER — Other Ambulatory Visit: Payer: Self-pay | Admitting: *Deleted

## 2015-10-04 DIAGNOSIS — M5137 Other intervertebral disc degeneration, lumbosacral region: Secondary | ICD-10-CM

## 2015-10-04 DIAGNOSIS — M419 Scoliosis, unspecified: Secondary | ICD-10-CM

## 2015-10-04 MED ORDER — HYDROCODONE-ACETAMINOPHEN 7.5-325 MG PO TABS: 1.0000 | ORAL_TABLET | Freq: Four times a day (QID) | ORAL | Status: DC | PRN

## 2015-10-04 NOTE — Telephone Encounter (Signed)
Patient called for refill on medication:   HYDROcodone-acetaminophen ([place]) 7.5-325 MG per tablet [id number]      Ph# [phone] (Home)

## 2015-10-05 NOTE — Telephone Encounter (Signed)
Prescription available, patient aware  

## 2015-10-06 DIAGNOSIS — M940 Chondrocostal junction syndrome [Tietze]: Secondary | ICD-10-CM | POA: Diagnosis not present

## 2015-10-06 DIAGNOSIS — F4325 Adjustment disorder with mixed disturbance of emotions and conduct: Secondary | ICD-10-CM | POA: Diagnosis not present

## 2015-10-06 DIAGNOSIS — E118 Type 2 diabetes mellitus with unspecified complications: Secondary | ICD-10-CM | POA: Diagnosis not present

## 2015-10-14 DIAGNOSIS — M542 Cervicalgia: Secondary | ICD-10-CM | POA: Diagnosis not present

## 2015-10-14 DIAGNOSIS — M25512 Pain in left shoulder: Secondary | ICD-10-CM | POA: Diagnosis not present

## 2015-10-14 DIAGNOSIS — M25612 Stiffness of left shoulder, not elsewhere classified: Secondary | ICD-10-CM | POA: Diagnosis not present

## 2015-10-14 DIAGNOSIS — M25511 Pain in right shoulder: Secondary | ICD-10-CM | POA: Diagnosis not present

## 2015-10-19 DIAGNOSIS — M25511 Pain in right shoulder: Secondary | ICD-10-CM | POA: Diagnosis not present

## 2015-10-19 DIAGNOSIS — M25512 Pain in left shoulder: Secondary | ICD-10-CM | POA: Diagnosis not present

## 2015-10-19 DIAGNOSIS — M542 Cervicalgia: Secondary | ICD-10-CM | POA: Diagnosis not present

## 2015-10-19 DIAGNOSIS — M25612 Stiffness of left shoulder, not elsewhere classified: Secondary | ICD-10-CM | POA: Diagnosis not present

## 2015-10-26 DIAGNOSIS — M25511 Pain in right shoulder: Secondary | ICD-10-CM | POA: Diagnosis not present

## 2015-10-26 DIAGNOSIS — M542 Cervicalgia: Secondary | ICD-10-CM | POA: Diagnosis not present

## 2015-10-26 DIAGNOSIS — M25612 Stiffness of left shoulder, not elsewhere classified: Secondary | ICD-10-CM | POA: Diagnosis not present

## 2015-10-26 DIAGNOSIS — M25512 Pain in left shoulder: Secondary | ICD-10-CM | POA: Diagnosis not present

## 2015-11-03 DIAGNOSIS — M25512 Pain in left shoulder: Secondary | ICD-10-CM | POA: Diagnosis not present

## 2015-11-03 DIAGNOSIS — M25511 Pain in right shoulder: Secondary | ICD-10-CM | POA: Diagnosis not present

## 2015-11-03 DIAGNOSIS — M542 Cervicalgia: Secondary | ICD-10-CM | POA: Diagnosis not present

## 2015-11-03 DIAGNOSIS — M25612 Stiffness of left shoulder, not elsewhere classified: Secondary | ICD-10-CM | POA: Diagnosis not present

## 2015-11-05 DIAGNOSIS — Z933 Colostomy status: Secondary | ICD-10-CM | POA: Diagnosis not present

## 2015-11-10 DIAGNOSIS — M25512 Pain in left shoulder: Secondary | ICD-10-CM | POA: Diagnosis not present

## 2015-11-10 DIAGNOSIS — M25511 Pain in right shoulder: Secondary | ICD-10-CM | POA: Diagnosis not present

## 2015-11-10 DIAGNOSIS — M542 Cervicalgia: Secondary | ICD-10-CM | POA: Diagnosis not present

## 2015-11-10 DIAGNOSIS — M25612 Stiffness of left shoulder, not elsewhere classified: Secondary | ICD-10-CM | POA: Diagnosis not present

## 2015-11-16 DIAGNOSIS — W182XXA Fall in (into) shower or empty bathtub, initial encounter: Secondary | ICD-10-CM | POA: Diagnosis not present

## 2015-11-17 ENCOUNTER — Other Ambulatory Visit: Payer: Self-pay | Admitting: *Deleted

## 2015-11-17 ENCOUNTER — Telehealth: Payer: Self-pay | Admitting: *Deleted

## 2015-11-17 DIAGNOSIS — M5137 Other intervertebral disc degeneration, lumbosacral region: Secondary | ICD-10-CM

## 2015-11-17 DIAGNOSIS — M419 Scoliosis, unspecified: Secondary | ICD-10-CM

## 2015-11-17 MED ORDER — HYDROCODONE-ACETAMINOPHEN 7.5-325 MG PO TABS: 1.0000 | ORAL_TABLET | Freq: Four times a day (QID) | ORAL | Status: DC | PRN

## 2015-11-17 NOTE — Telephone Encounter (Signed)
Patient's nephew Richardson Landry came to pick up patients rx. FYI form given to him for further prescription pick ups.

## 2015-11-17 NOTE — Telephone Encounter (Signed)
Prescription available, patient aware  

## 2015-11-17 NOTE — Telephone Encounter (Signed)
Patient called requesting her hydrocodone to be refilled. Please advise [phone]

## 2015-11-22 DIAGNOSIS — E118 Type 2 diabetes mellitus with unspecified complications: Secondary | ICD-10-CM | POA: Diagnosis not present

## 2015-11-22 DIAGNOSIS — R1031 Right lower quadrant pain: Secondary | ICD-10-CM | POA: Diagnosis not present

## 2015-11-23 ENCOUNTER — Other Ambulatory Visit (HOSPITAL_COMMUNITY): Payer: Self-pay | Admitting: Family Medicine

## 2015-11-23 ENCOUNTER — Ambulatory Visit (HOSPITAL_COMMUNITY)
Admission: RE | Admit: 2015-11-23 | Discharge: 2015-11-23 | Disposition: A | Discharge status: Home / Self Care | Payer: Medicare Other | Source: Ambulatory Visit | Attending: Family Medicine | Admitting: Family Medicine

## 2015-11-23 DIAGNOSIS — R1031 Right lower quadrant pain: Secondary | ICD-10-CM | POA: Insufficient documentation

## 2015-11-23 DIAGNOSIS — M25551 Pain in right hip: Secondary | ICD-10-CM | POA: Insufficient documentation

## 2015-11-23 DIAGNOSIS — S3993XA Unspecified injury of pelvis, initial encounter: Secondary | ICD-10-CM | POA: Diagnosis not present

## 2015-11-24 DIAGNOSIS — M542 Cervicalgia: Secondary | ICD-10-CM | POA: Diagnosis not present

## 2015-11-24 DIAGNOSIS — M25511 Pain in right shoulder: Secondary | ICD-10-CM | POA: Diagnosis not present

## 2015-11-24 DIAGNOSIS — M25612 Stiffness of left shoulder, not elsewhere classified: Secondary | ICD-10-CM | POA: Diagnosis not present

## 2015-11-24 DIAGNOSIS — M25512 Pain in left shoulder: Secondary | ICD-10-CM | POA: Diagnosis not present

## 2015-12-02 DIAGNOSIS — M25511 Pain in right shoulder: Secondary | ICD-10-CM | POA: Diagnosis not present

## 2015-12-02 DIAGNOSIS — M25612 Stiffness of left shoulder, not elsewhere classified: Secondary | ICD-10-CM | POA: Diagnosis not present

## 2015-12-02 DIAGNOSIS — M25512 Pain in left shoulder: Secondary | ICD-10-CM | POA: Diagnosis not present

## 2015-12-02 DIAGNOSIS — M542 Cervicalgia: Secondary | ICD-10-CM | POA: Diagnosis not present

## 2015-12-15 DIAGNOSIS — N183 Chronic kidney disease, stage 3 (moderate): Secondary | ICD-10-CM | POA: Diagnosis not present

## 2015-12-16 DIAGNOSIS — M25512 Pain in left shoulder: Secondary | ICD-10-CM | POA: Diagnosis not present

## 2015-12-16 DIAGNOSIS — M25511 Pain in right shoulder: Secondary | ICD-10-CM | POA: Diagnosis not present

## 2015-12-16 DIAGNOSIS — M25612 Stiffness of left shoulder, not elsewhere classified: Secondary | ICD-10-CM | POA: Diagnosis not present

## 2015-12-16 DIAGNOSIS — M542 Cervicalgia: Secondary | ICD-10-CM | POA: Diagnosis not present

## 2015-12-20 ENCOUNTER — Other Ambulatory Visit: Payer: Self-pay | Admitting: *Deleted

## 2015-12-20 ENCOUNTER — Telehealth: Payer: Self-pay | Admitting: *Deleted

## 2015-12-20 DIAGNOSIS — M5137 Other intervertebral disc degeneration, lumbosacral region: Secondary | ICD-10-CM

## 2015-12-20 DIAGNOSIS — M419 Scoliosis, unspecified: Secondary | ICD-10-CM

## 2015-12-20 DIAGNOSIS — Z933 Colostomy status: Secondary | ICD-10-CM | POA: Diagnosis not present

## 2015-12-20 MED ORDER — HYDROCODONE-ACETAMINOPHEN 7.5-325 MG PO TABS: 1.0000 | ORAL_TABLET | Freq: Four times a day (QID) | ORAL | Status: DC | PRN

## 2015-12-20 NOTE — Telephone Encounter (Signed)
Patient's nephew called requesting hydrocodone to be refilled. Please advise

## 2015-12-20 NOTE — Telephone Encounter (Signed)
Prescription available, patient aware  

## 2015-12-21 NOTE — Telephone Encounter (Signed)
Patient's granson collected rx

## 2015-12-22 ENCOUNTER — Encounter: Payer: Self-pay | Admitting: Family

## 2015-12-23 DIAGNOSIS — M25512 Pain in left shoulder: Secondary | ICD-10-CM | POA: Diagnosis not present

## 2015-12-23 DIAGNOSIS — M25612 Stiffness of left shoulder, not elsewhere classified: Secondary | ICD-10-CM | POA: Diagnosis not present

## 2015-12-23 DIAGNOSIS — M25511 Pain in right shoulder: Secondary | ICD-10-CM | POA: Diagnosis not present

## 2015-12-23 DIAGNOSIS — M542 Cervicalgia: Secondary | ICD-10-CM | POA: Diagnosis not present

## 2015-12-28 DIAGNOSIS — M25512 Pain in left shoulder: Secondary | ICD-10-CM | POA: Diagnosis not present

## 2015-12-28 DIAGNOSIS — M25511 Pain in right shoulder: Secondary | ICD-10-CM | POA: Diagnosis not present

## 2015-12-28 DIAGNOSIS — M25612 Stiffness of left shoulder, not elsewhere classified: Secondary | ICD-10-CM | POA: Diagnosis not present

## 2015-12-28 DIAGNOSIS — M542 Cervicalgia: Secondary | ICD-10-CM | POA: Diagnosis not present

## 2015-12-30 ENCOUNTER — Ambulatory Visit (INDEPENDENT_AMBULATORY_CARE_PROVIDER_SITE_OTHER): Payer: Medicare Other | Admitting: Family

## 2015-12-30 ENCOUNTER — Other Ambulatory Visit: Payer: Self-pay | Admitting: Vascular Surgery

## 2015-12-30 ENCOUNTER — Ambulatory Visit (INDEPENDENT_AMBULATORY_CARE_PROVIDER_SITE_OTHER)
Admission: RE | Admit: 2015-12-30 | Discharge: 2015-12-30 | Disposition: A | Discharge status: Home / Self Care | Payer: Medicare Other | Source: Ambulatory Visit | Attending: Vascular Surgery | Admitting: Vascular Surgery

## 2015-12-30 ENCOUNTER — Encounter: Payer: Self-pay | Admitting: Family

## 2015-12-30 ENCOUNTER — Ambulatory Visit (HOSPITAL_COMMUNITY)
Admission: RE | Admit: 2015-12-30 | Discharge: 2015-12-30 | Disposition: A | Discharge status: Home / Self Care | Payer: Medicare Other | Source: Ambulatory Visit | Attending: Vascular Surgery | Admitting: Vascular Surgery

## 2015-12-30 VITALS — BP 129/70 | HR 75 | Temp 97.5°F | Resp 16 | Ht 60.0 in | Wt 122.0 lb

## 2015-12-30 DIAGNOSIS — E785 Hyperlipidemia, unspecified: Secondary | ICD-10-CM | POA: Insufficient documentation

## 2015-12-30 DIAGNOSIS — Z48812 Encounter for surgical aftercare following surgery on the circulatory system: Secondary | ICD-10-CM

## 2015-12-30 DIAGNOSIS — Z95828 Presence of other vascular implants and grafts: Secondary | ICD-10-CM | POA: Diagnosis not present

## 2015-12-30 DIAGNOSIS — Z4889 Encounter for other specified surgical aftercare: Secondary | ICD-10-CM | POA: Diagnosis not present

## 2015-12-30 DIAGNOSIS — I739 Peripheral vascular disease, unspecified: Secondary | ICD-10-CM

## 2015-12-30 DIAGNOSIS — I10 Essential (primary) hypertension: Secondary | ICD-10-CM | POA: Diagnosis not present

## 2015-12-30 DIAGNOSIS — Z9862 Peripheral vascular angioplasty status: Secondary | ICD-10-CM | POA: Insufficient documentation

## 2015-12-30 DIAGNOSIS — E119 Type 2 diabetes mellitus without complications: Secondary | ICD-10-CM | POA: Insufficient documentation

## 2015-12-30 NOTE — Patient Instructions (Signed)
Peripheral Vascular Disease Peripheral vascular disease (PVD) is a disease of the blood vessels that are not part of your heart and brain. A simple term for PVD is poor circulation. In most cases, PVD narrows the blood vessels that carry blood from your heart to the rest of your body. This can result in a decreased supply of blood to your arms, legs, and internal organs, like your stomach or kidneys. However, it most often affects a person's lower legs and feet. There are two types of PVD.  Organic PVD. This is the more common type. It is caused by damage to the structure of blood vessels.  Functional PVD. This is caused by conditions that make blood vessels contract and tighten (spasm). Without treatment, PVD tends to get worse over time. PVD can also lead to acute ischemic limb. This is when an arm or limb suddenly has trouble getting enough blood. This is a medical emergency. CAUSES Each type of PVD has many different causes. The most common cause of PVD is buildup of a fatty material (plaque) inside of your arteries (atherosclerosis). Small amounts of plaque can break off from the walls of the blood vessels and become lodged in a smaller artery. This blocks blood flow and can cause acute ischemic limb. Other common causes of PVD include:  Blood clots that form inside of blood vessels.  Injuries to blood vessels.  Diseases that cause inflammation of blood vessels or cause blood vessel spasms.  Health behaviors and health history that increase your risk of developing PVD. RISK FACTORS  You may have a greater risk of PVD if you:  Have a family history of PVD.  Have certain medical conditions, including:  High cholesterol.  Diabetes.  High blood pressure (hypertension).  Coronary heart disease.  Past problems with blood clots.  Past injury, such as burns or a broken bone. These may have damaged blood vessels in your limbs.  Buerger disease. This is caused by inflamed blood  vessels in your hands and feet.  Some forms of arthritis.  Rare birth defects that affect the arteries in your legs.  Use tobacco.  Do not get enough exercise.  Are obese.  Are age 50 or older. SIGNS AND SYMPTOMS  PVD may cause many different symptoms. Your symptoms depend on what part of your body is not getting enough blood. Some common signs and symptoms include:  Cramps in your lower legs. This may be a symptom of poor leg circulation (claudication).  Pain and weakness in your legs while you are physically active that goes away when you rest (intermittent claudication).  Leg pain when at rest.  Leg numbness, tingling, or weakness.  Coldness in a leg or foot, especially when compared with the other leg.  Skin or hair changes. These can include:  Hair loss.  Shiny skin.  Pale or bluish skin.  Thick toenails.  Inability to get or maintain an erection (erectile dysfunction). People with PVD are more prone to developing ulcers and sores on their toes, feet, or legs. These may take longer than normal to heal. DIAGNOSIS Your health care provider may diagnose PVD from your signs and symptoms. The health care provider will also do a physical exam. You may have tests to find out what is causing your PVD and determine its severity. Tests may include:  Blood pressure recordings from your arms and legs and measurements of the strength of your pulses (pulse volume recordings).  Imaging studies using sound waves to take pictures of   the blood flow through your blood vessels (Doppler ultrasound).  Injecting a dye into your blood vessels before having imaging studies using:  X-rays (angiogram or arteriogram).  Computer-generated X-rays (CT angiogram).  A powerful electromagnetic field and a computer (magnetic resonance angiogram or MRA). TREATMENT Treatment for PVD depends on the cause of your condition and the severity of your symptoms. It also depends on your age. Underlying  causes need to be treated and controlled. These include long-lasting (chronic) conditions, such as diabetes, high cholesterol, and high blood pressure. You may need to first try making lifestyle changes and taking medicines. Surgery may be needed if these do not work. Lifestyle changes may include:  Quitting smoking.  Exercising regularly.  Following a low-fat, low-cholesterol diet. Medicines may include:  Blood thinners to prevent blood clots.  Medicines to improve blood flow.  Medicines to improve your blood cholesterol levels. Surgical procedures may include:  A procedure that uses an inflated balloon to open a blocked artery and improve blood flow (angioplasty).  A procedure to put in a tube (stent) to keep a blocked artery open (stent implant).  Surgery to reroute blood flow around a blocked artery (peripheral bypass surgery).  Surgery to remove dead tissue from an infected wound on the affected limb.  Amputation. This is surgical removal of the affected limb. This may be necessary in cases of acute ischemic limb that are not improved through medical or surgical treatments. HOME CARE INSTRUCTIONS  Take medicines only as directed by your health care provider.  Do not use any tobacco products, including cigarettes, chewing tobacco, or electronic cigarettes. If you need help quitting, ask your health care provider.  Lose weight if you are overweight, and maintain a healthy weight as directed by your health care provider.  Eat a diet that is low in fat and cholesterol. If you need help, ask your health care provider.  Exercise regularly. Ask your health care provider to suggest some good activities for you.  Use compression stockings or other mechanical devices as directed by your health care provider.  Take good care of your feet.  Wear comfortable shoes that fit well.  Check your feet often for any cuts or sores. SEEK MEDICAL CARE IF:  You have cramps in your legs  while walking.  You have leg pain when you are at rest.  You have coldness in a leg or foot.  Your skin changes.  You have erectile dysfunction.  You have cuts or sores on your feet that are not healing. SEEK IMMEDIATE MEDICAL CARE IF:  Your arm or leg turns cold and blue.  Your arms or legs become red, warm, swollen, painful, or numb.  You have chest pain or trouble breathing.  You suddenly have weakness in your face, arm, or leg.  You become very confused or lose the ability to speak.  You suddenly have a very bad headache or lose your vision.   This information is not intended to replace advice given to you by your health care provider. Make sure you discuss any questions you have with your health care provider.   Document Released: 12/28/2004 Document Revised: 12/11/2014 Document Reviewed: 04/30/2014 Elsevier Interactive Patient Education 2016 [employer]  

## 2015-12-30 NOTE — Progress Notes (Signed)
Filed Vitals:   12/30/15 1432 12/30/15 1436  BP: 142/72 129/70  Pulse: 77 75  Temp: 97.5 F (36.4 C)   TempSrc: Oral   Resp: 16   Height: 5' (1.524 m)   Weight: 122 lb (55.339 kg)   SpO2: 100%

## 2015-12-30 NOTE — Progress Notes (Signed)
[facility] [place] HISTORY AND PHYSICAL -PAD  History of Present Illness [name] is a 82 y.o. female patient of Dr. Oneida Alar who is s/p right femoral to above knee popliteal bypass graft 2005; left femoral to below knee popliteal bypass graft 01/2009 with thrombectomy of the iliac artery. She also had an angioplasty of this in May of 2010 by Dr Trula Slade.  She returns today for follow up.  She had a venous duplex exam at [facility] at which time they noted some thrombus in her left femoropopliteal bypass graft. She was referred here for further followup regarding this.  The gabapentin helps "the funny feeling" in her legs at night. She has generalized stiffness every morning that resolves within 30 minutes. She does not seem to have claudication pain in her legs with walking, denies non healing wounds, denies history of stroke or TIA.  She lost 15 pounds in a year, unintentional, states her appetite is good, denies post prandial abdominal pain. Pt states her PCP is aware of her weight loss. She has a colostomy for unknown reason, states she did not have cancer.  Pt Diabetic: Yes, diet controlled Pt smoker: non-smoker, never  Pt meds include: Statin :No ASA: No, sees a nephrologist Other anticoagulants/antiplatelets: no      Past Medical History  Diagnosis Date  . HTN (hypertension)   . Rotator cuff insufficiency 03/23/2011  . Arthritis   . Hyperlipidemia   . Thyroid disease   . Hypothyroidism   . Diabetes mellitus without complication   . Cancer   . Depression     Social History Social History  Substance Use Topics  . Smoking status: Never Smoker   . Smokeless tobacco: Never Used  . Alcohol Use: No    Family History Family History  Problem Relation Age of Onset  . Stroke Mother   . Diabetes Mother     Leg amputation  . Hypertension Mother   . Heart disease Father   . Heart attack Father   . Cancer Daughter     Past  Surgical History  Procedure Laterality Date  . Vesicovaginal fistula closure w/ tah    . Carpal tunnel release      right side  . Tonsillectomy    . Cholecystectomy    . Left shoulder hemiarthroplasty    . Appendectomy    . Grafts of bilateral lower extremities    . Colon surgery      sigmoid colectomy complicated by anastomotic leak  . Colostomy  transverse loop, 2006  . Abdominal hysterectomy    . Cataract extraction w/phaco Right 06/05/2013    Procedure: CATARACT EXTRACTION PHACO AND INTRAOCULAR LENS PLACEMENT (IOC);  Surgeon: Tonny Branch, MD;  Location: [facility];  Service: Ophthalmology;  Laterality: Right;  CDE: 10.58  . Cataract extraction w/phaco Left 07/10/2013    Procedure: CATARACT EXTRACTION PHACO AND INTRAOCULAR LENS PLACEMENT (IOC);  Surgeon: Tonny Branch, MD;  Location: [facility];  Service: Ophthalmology;  Laterality: Left;  CDE: 17.35  . Eye surgery      No Known Allergies  Current Outpatient Prescriptions  Medication Sig Dispense Refill  . amLODipine (NORVASC) 10 MG tablet Take 10 mg by mouth daily.    . Amlodipine-Valsartan-HCTZ (EXFORGE HCT) 5-160-12.5 MG TABS Take 1 tablet by mouth daily.    Marland Kitchen aspirin 81 MG tablet Take 81 mg by mouth daily.    . cefUROXime (CEFTIN) 250 MG tablet Take 250 mg by mouth 2 (two) times daily  with a meal.    . diclofenac (FLECTOR) 1.3 % PTCH Place 1 patch onto the skin 2 (two) times daily.    Marland Kitchen escitalopram (LEXAPRO) 5 MG tablet Take 5 mg by mouth daily.    . ferrous sulfate 325 (65 FE) MG tablet Take 325 mg by mouth daily with breakfast.    . gabapentin (NEURONTIN) 100 MG capsule Take 1 capsule (100 mg total) by mouth 3 (three) times daily. 90 capsule 5  . HYDROcodone-acetaminophen (NORCO) 7.5-325 MG tablet Take 1 tablet by mouth every 6 (six) hours as needed. 120 tablet 0  . levothyroxine (SYNTHROID, LEVOTHROID) 75 MCG tablet Take 75 mcg by mouth daily.      . methocarbamol (ROBAXIN) 500 MG tablet Take 1 tablet (500 mg total) by mouth every 8  (eight) hours as needed for muscle spasms. 30 tablet 0  . Multiple Vitamin (MULTIVITAMIN WITH MINERALS) TABS Take 1 tablet by mouth daily.    . mupirocin ointment (BACTROBAN) 2 % Place 1 application into the nose 3 (three) times daily.    . niacin (NIASPAN) 500 MG CR tablet Take 500 mg by mouth at bedtime.    . predniSONE (DELTASONE) 10 MG tablet Take 10 mg by mouth 2 (two) times daily.     No current facility-administered medications for this visit.    ROS: See HPI for pertinent positives and negatives.   Physical Examination  Filed Vitals:   12/30/15 1432 12/30/15 1436  BP: 142/72 129/70  Pulse: 77 75  Temp: 97.5 F (36.4 C)   TempSrc: Oral   Resp: 16   Height: 5' (1.524 m)   Weight: 122 lb (55.339 kg)   SpO2: 100%    Body mass index is 23.83 kg/(m^2).   General: A&O x 3, WDWN. Gait: normal Eyes: PERRLA. Pulmonary: CTAB, without wheezes , rales or rhonchi. Cardiac: regular Rythm , without detected murmur.     Carotid Bruits Left Right   Negative Negative  Aorta is not palpable. Radial pulses: are 2+ palpable and =   VASCULAR EXAM: Extremities without ischemic changes  without Gangrene; without open wounds.     LE Pulses LEFT RIGHT   FEMORAL 3+ palpable 3+ palpable    POPLITEAL 2+ palpable  2+ palpable   POSTERIOR TIBIAL not palpable  not palpable    DORSALIS PEDIS  ANTERIOR TIBIAL 1+ palpable  1+ palpable    Abdomen: soft, NT, no masses, colostomy in RLQ with appliance in place. Skin: no rashes, no ulcers. Musculoskeletal: no muscle wasting or atrophy. Neurologic: A&O X 3; Appropriate Affect ; SENSATION: normal; MOTOR FUNCTION: moving all extremities equally, motor strength 4/5 throughout. Speech is fluent/normal. CN 2-12 intact.          Non-Invasive Vascular Imaging: DATE: 12/30/2015 LOWER EXTREMITY ARTERIAL DUPLEX EVALUATION    INDICATION: Follow-up bilateral lower extremity bypass graft     PREVIOUS INTERVENTION(S): Right femoropopliteal arterial bypass graft placed 2005 Left femoropopliteal arterial bypass graft revision 01/07/2009 with angioplasty 04/20/2009    DUPLEX EXAM:     RIGHT  LEFT   Peak Systolic Velocity (cm/s) Ratio (if abnormal) Waveform  Peak Systolic Velocity (cm/s) Ratio (if abnormal) Waveform  149  T Inflow Artery 216  T  186  T Proximal Anastomosis 221  T  56  T Proximal Graft 65  T  43  T Mid Graft 66  T  48  T  Distal Graft 56  T  92  T Distal Anastomosis 82  T  79  T Outflow Artery 114  T  1.02 Today's ABI / TBI 1.07  0.80 Previous ABI / TBI (12/10/2014 ) 0.93    Waveform:    M - Monophasic       B - Biphasic       T - Triphasic  If Ankle Brachial Index (ABI) or Toe Brachial Index (TBI) performed, please see complete report  ADDITIONAL FINDINGS:     IMPRESSION: Widely patent bilateral femoropopliteal arterial bypass graft without evidence of restenosis or hyperplasia.     Compared to the previous exam:  No significant change compared to prior exam.      ASSESSMENT: [name] is a 82 y.o. female who is s/p right femoral to above knee popliteal bypass graft 2005; left femoral to below knee popliteal bypass graft 01/2009 with thrombectomy of the iliac artery.  She has no claudication symptoms, no signs of ischemia in her feet/legs. Today's bilateral LE arterial duplex suggests widely patent bilateral femoropopliteal arterial bypass graft without evidence of restenosis or hyperplasia. No significant change compared to prior exam. ABI's have improved to normal in 6 months with tri and biphasic waveforms.  She reports not walking much, no barriers to walking other than she gets tired of she walks too much. Nevertheless, her ABI's have improved to normal with no evidence of  vessel calcification.    PLAN:  Based on the patient's vascular studies and examination, pt will return to clinic in 1 year for ABI's and bilateral LE arterial duplex and evaluation by me.  I discussed in depth with the patient the nature of atherosclerosis, and emphasized the importance of maximal medical management including strict control of blood pressure, blood glucose, and lipid levels, obtaining regular exercise, and continued cessation of smoking.  The patient is aware that without maximal medical management the underlying atherosclerotic disease process will progress, limiting the benefit of any interventions.  The patient was given information about PAD including signs, symptoms, treatment, what symptoms should prompt the patient to seek immediate medical care, and risk reduction measures to take.  Clemon Chambers, RN, MSN, FNP-C [facility] Phone: [phone]  Clinic MD: Oneida Alar  12/30/2015 2:18 PM

## 2015-12-31 NOTE — Addendum Note (Signed)
Addended by: Dorthula Rue L on: 12/31/2015 11:01 AM   Modules accepted: Orders

## 2016-01-03 DIAGNOSIS — M25612 Stiffness of left shoulder, not elsewhere classified: Secondary | ICD-10-CM | POA: Diagnosis not present

## 2016-01-03 DIAGNOSIS — M25512 Pain in left shoulder: Secondary | ICD-10-CM | POA: Diagnosis not present

## 2016-01-03 DIAGNOSIS — M25511 Pain in right shoulder: Secondary | ICD-10-CM | POA: Diagnosis not present

## 2016-01-03 DIAGNOSIS — M542 Cervicalgia: Secondary | ICD-10-CM | POA: Diagnosis not present

## 2016-01-05 DIAGNOSIS — M25612 Stiffness of left shoulder, not elsewhere classified: Secondary | ICD-10-CM | POA: Diagnosis not present

## 2016-01-05 DIAGNOSIS — M25511 Pain in right shoulder: Secondary | ICD-10-CM | POA: Diagnosis not present

## 2016-01-05 DIAGNOSIS — M25512 Pain in left shoulder: Secondary | ICD-10-CM | POA: Diagnosis not present

## 2016-01-05 DIAGNOSIS — M542 Cervicalgia: Secondary | ICD-10-CM | POA: Diagnosis not present

## 2016-01-07 DIAGNOSIS — M79603 Pain in arm, unspecified: Secondary | ICD-10-CM | POA: Diagnosis not present

## 2016-01-24 ENCOUNTER — Other Ambulatory Visit: Payer: Self-pay | Admitting: *Deleted

## 2016-01-24 ENCOUNTER — Telehealth: Payer: Self-pay | Admitting: *Deleted

## 2016-01-24 ENCOUNTER — Telehealth: Payer: Self-pay | Admitting: Orthopedic Surgery

## 2016-01-24 DIAGNOSIS — M5137 Other intervertebral disc degeneration, lumbosacral region: Secondary | ICD-10-CM

## 2016-01-24 DIAGNOSIS — M419 Scoliosis, unspecified: Secondary | ICD-10-CM

## 2016-01-24 MED ORDER — HYDROCODONE-ACETAMINOPHEN 7.5-325 MG PO TABS: 1.0000 | ORAL_TABLET | Freq: Four times a day (QID) | ORAL | Status: DC | PRN

## 2016-01-24 NOTE — Telephone Encounter (Signed)
Prescription available, patient aware  

## 2016-01-24 NOTE — Telephone Encounter (Signed)
Patient is requesting her hydrocodone 7.5 mg to be refilled. Please advise [phone]

## 2016-01-24 NOTE — Telephone Encounter (Signed)
Call received from patient/nephew who stays with her - relayed that a new designated party release form is needed to be signed and scanned.  Medication is: HYDROcodone-acetaminophen (NORCO) 7.5-325 MG tablet [id number] - also, patient was last seen here 05/12/14.  Okay to also schedule a follow up appointment?  Patient's home [phone]

## 2016-01-24 NOTE — Telephone Encounter (Signed)
Prescription picked up by nephew w/proof of identification, per front office personnel.

## 2016-01-25 DIAGNOSIS — Z933 Colostomy status: Secondary | ICD-10-CM | POA: Diagnosis not present

## 2016-02-08 DIAGNOSIS — N189 Chronic kidney disease, unspecified: Secondary | ICD-10-CM | POA: Diagnosis not present

## 2016-02-08 DIAGNOSIS — E118 Type 2 diabetes mellitus with unspecified complications: Secondary | ICD-10-CM | POA: Diagnosis not present

## 2016-02-08 DIAGNOSIS — Z0000 Encounter for general adult medical examination without abnormal findings: Secondary | ICD-10-CM | POA: Diagnosis not present

## 2016-02-15 ENCOUNTER — Telehealth: Payer: Self-pay | Admitting: Orthopedic Surgery

## 2016-02-15 ENCOUNTER — Other Ambulatory Visit: Payer: Self-pay | Admitting: *Deleted

## 2016-02-15 NOTE — Telephone Encounter (Signed)
Call from patient and patient nephew, one of her designated party contacts, Alva Garnet - request for refill of HYDROcodone-acetaminophen (NORCO) 7.5-325 MG tablet [id number] - please advise. We have scheduled an appointment for patient since her last visit was in June of 2015.  Patient ph# [phone]

## 2016-02-15 NOTE — Telephone Encounter (Signed)
Not due until Monday of next week

## 2016-02-17 ENCOUNTER — Telehealth: Payer: Self-pay

## 2016-02-17 NOTE — Telephone Encounter (Signed)
Phone call from pt. With c/o "having problems with feet and right leg.  C/o feet feeling cold for approx. 1 week.   Also c/o constant feeling that both feet feel like they are asleep.  Reported she has right leg discomfort in calf, with walking.  Denied any rest pain.  Denied any nonhealing sores of lower extremities.  Stated she is able to move her toes and feet well.  Denied any swelling of feet or legs.  Discussed with S. Nickel, NP.  Medical record reviewed, including last ABI's and office exam 12/30/15.  Per recommendation of Nurse Practitioner, the pt. should contact her PCP, as her symptoms are likely not related to vascular.  If the PCP recommends to f/u with Vascular, then an appt. will be made.  Attempted to contact the pt. with above recommendation.  No answer at this time.

## 2016-02-18 ENCOUNTER — Emergency Department (HOSPITAL_COMMUNITY): Payer: Medicare Other

## 2016-02-18 ENCOUNTER — Encounter (HOSPITAL_COMMUNITY): Payer: Self-pay | Admitting: *Deleted

## 2016-02-18 ENCOUNTER — Emergency Department (HOSPITAL_COMMUNITY)
Admission: EM | Admit: 2016-02-18 | Discharge: 2016-02-18 | Disposition: A | Discharge status: Home / Self Care | Payer: Medicare Other | Attending: Emergency Medicine | Admitting: Emergency Medicine

## 2016-02-18 DIAGNOSIS — E785 Hyperlipidemia, unspecified: Secondary | ICD-10-CM | POA: Diagnosis not present

## 2016-02-18 DIAGNOSIS — M79604 Pain in right leg: Secondary | ICD-10-CM

## 2016-02-18 DIAGNOSIS — M1990 Unspecified osteoarthritis, unspecified site: Secondary | ICD-10-CM | POA: Insufficient documentation

## 2016-02-18 DIAGNOSIS — R200 Anesthesia of skin: Secondary | ICD-10-CM | POA: Diagnosis present

## 2016-02-18 DIAGNOSIS — E039 Hypothyroidism, unspecified: Secondary | ICD-10-CM | POA: Diagnosis not present

## 2016-02-18 DIAGNOSIS — E119 Type 2 diabetes mellitus without complications: Secondary | ICD-10-CM | POA: Diagnosis not present

## 2016-02-18 DIAGNOSIS — F329 Major depressive disorder, single episode, unspecified: Secondary | ICD-10-CM | POA: Diagnosis not present

## 2016-02-18 DIAGNOSIS — I739 Peripheral vascular disease, unspecified: Secondary | ICD-10-CM | POA: Insufficient documentation

## 2016-02-18 DIAGNOSIS — I10 Essential (primary) hypertension: Secondary | ICD-10-CM | POA: Insufficient documentation

## 2016-02-18 DIAGNOSIS — Z7982 Long term (current) use of aspirin: Secondary | ICD-10-CM | POA: Diagnosis not present

## 2016-02-18 DIAGNOSIS — R209 Unspecified disturbances of skin sensation: Secondary | ICD-10-CM | POA: Diagnosis not present

## 2016-02-18 DIAGNOSIS — Z79899 Other long term (current) drug therapy: Secondary | ICD-10-CM | POA: Insufficient documentation

## 2016-02-18 HISTORY — DX: Thrombosis due to vascular prosthetic devices, implants and grafts, initial encounter: T82.868A

## 2016-02-18 LAB — CBC
HCT: 34.3 % — ABNORMAL LOW (ref 36.0–46.0)
Hemoglobin: 10.5 g/dL — ABNORMAL LOW (ref 12.0–15.0)
MCH: 26.6 pg (ref 26.0–34.0)
MCHC: 30.6 g/dL (ref 30.0–36.0)
MCV: 87.1 fL (ref 78.0–100.0)
Platelets: 289 10*3/uL (ref 150–400)
RBC: 3.94 MIL/uL (ref 3.87–5.11)
RDW: 15.5 % (ref 11.5–15.5)
WBC: 6.1 10*3/uL (ref 4.0–10.5)

## 2016-02-18 LAB — I-STAT CHEM 8, ED
BUN: 19 mg/dL (ref 6–20)
Calcium, Ion: 1.27 mmol/L (ref 1.13–1.30)
Chloride: 112 mmol/L — ABNORMAL HIGH (ref 101–111)
Creatinine, Ser: 1.9 mg/dL — ABNORMAL HIGH (ref 0.44–1.00)
Glucose, Bld: 94 mg/dL (ref 65–99)
HCT: 34 % — ABNORMAL LOW (ref 36.0–46.0)
Hemoglobin: 11.6 g/dL — ABNORMAL LOW (ref 12.0–15.0)
Potassium: 5 mmol/L (ref 3.5–5.1)
Sodium: 144 mmol/L (ref 135–145)
TCO2: 22 mmol/L (ref 0–100)

## 2016-02-18 LAB — BASIC METABOLIC PANEL
Anion gap: 5 (ref 5–15)
BUN: 20 mg/dL (ref 6–20)
CO2: 25 mmol/L (ref 22–32)
Calcium: 8.7 mg/dL — ABNORMAL LOW (ref 8.9–10.3)
Chloride: 111 mmol/L (ref 101–111)
Creatinine, Ser: 1.99 mg/dL — ABNORMAL HIGH (ref 0.44–1.00)
GFR calc Af Amer: 25 mL/min — ABNORMAL LOW (ref >60)
GFR calc non Af Amer: 21 mL/min — ABNORMAL LOW (ref >60)
Glucose, Bld: 98 mg/dL (ref 65–99)
Potassium: 5 mmol/L (ref 3.5–5.1)
Sodium: 141 mmol/L (ref 135–145)

## 2016-02-18 LAB — CBG MONITORING, ED: Glucose-Capillary: 114 mg/dL — ABNORMAL HIGH (ref 65–99)

## 2016-02-18 MED ORDER — ASPIRIN 81 MG PO CHEW
324.0000 mg | CHEWABLE_TABLET | Freq: Once | ORAL | Status: AC
  Administered 2016-02-18: 324 mg via ORAL
  Filled 2016-02-18: qty 4

## 2016-02-18 NOTE — Telephone Encounter (Signed)
Called pt. back to give recommendation by Nurse Practitioner.  Pt. stated she is going to the ER as soon as her niece arrives.  Reported her feet still feel like they are asleep, and not getting better.  Denied pain or cold sensation in feet this AM.

## 2016-02-18 NOTE — ED Notes (Signed)
Pt states bilateral feet feel numb, started this Monday. Denies swelling. Ambulatory to triage.

## 2016-02-18 NOTE — ED Provider Notes (Signed)
CSN: [id number]     Arrival date & time 02/18/16  1236 History   First MD Initiated Contact with Patient 02/18/16 1502     Chief Complaint  Patient presents with  . Numbness     (Consider location/radiation/quality/duration/timing/severity/associated sxs/prior Treatment) HPI Comments: 82 year old female with arthritis, vascular disease with bypass history follows with fascicles surgery, cervical stenosis, rotator cuff injury in the right presents with worsening numbness bilateral feet and cold sensation in her toes for almost a week. Patient had a workup by vascular surgery approximately 2 months ago which was unremarkable. Patient is on aspirin. Patient denies any other neurologic or vascular symptoms. Patient feels well otherwise. Patient has been taking gabapentin and feels her symptoms are worsening. Symptoms not specifically worse with walking however patient has difficulty at baseline with walking. No fevers or infectious symptoms, no recent surgery no blood clot history. No active cancer treatment  The history is provided by the patient and medical records.    Past Medical History  Diagnosis Date  . HTN (hypertension)   . Rotator cuff insufficiency 03/23/2011  . Arthritis   . Hyperlipidemia   . Diabetes mellitus without complication ([place])   . Depression   . Peripheral vascular disease ([place])   . Thyroid disease   . Hypothyroidism   . Cancer ([place])   . Femoral-femoral bypass graft thrombosis, left [facility])    Past Surgical History  Procedure Laterality Date  . Vesicovaginal fistula closure w/ tah    . Carpal tunnel release      right side  . Tonsillectomy    . Cholecystectomy    . Left shoulder hemiarthroplasty    . Appendectomy    . Grafts of bilateral lower extremities    . Colon surgery      sigmoid colectomy complicated by anastomotic leak  . Colostomy  transverse loop, 2006  . Abdominal hysterectomy    . Cataract extraction w/phaco Right 06/05/2013    Procedure:  CATARACT EXTRACTION PHACO AND INTRAOCULAR LENS PLACEMENT (IOC);  Surgeon: Tonny Branch, MD;  Location: [facility];  Service: Ophthalmology;  Laterality: Right;  CDE: 10.58  . Cataract extraction w/phaco Left 07/10/2013    Procedure: CATARACT EXTRACTION PHACO AND INTRAOCULAR LENS PLACEMENT (IOC);  Surgeon: Tonny Branch, MD;  Location: [facility];  Service: Ophthalmology;  Laterality: Left;  CDE: 17.35  . Eye surgery     Family History  Problem Relation Age of Onset  . Stroke Mother   . Diabetes Mother     Leg amputation  . Hypertension Mother   . Heart disease Father   . Heart attack Father   . Cancer Daughter    Social History  Substance Use Topics  . Smoking status: Never Smoker   . Smokeless tobacco: Never Used  . Alcohol Use: No   OB History    No data available     Review of Systems  Constitutional: Negative for fever and chills.  HENT: Negative for congestion.   Eyes: Negative for visual disturbance.  Respiratory: Negative for shortness of breath.   Cardiovascular: Negative for chest pain.  Gastrointestinal: Negative for vomiting and abdominal pain.  Genitourinary: Negative for dysuria and flank pain.  Musculoskeletal: Negative for back pain, neck pain and neck stiffness.  Skin: Negative for rash.  Neurological: Positive for numbness. Negative for weakness, light-headedness and headaches.      Allergies  Review of patient's allergies indicates no known allergies.  Home Medications   Prior to Admission medications   Medication Sig  Start Date End Date Taking? Authorizing Provider  acetaminophen-codeine (TYLENOL #3) 300-30 MG tablet Take 1 tablet by mouth every 8 (eight) hours as needed for moderate pain.   Yes Historical Provider, MD  amLODipine (NORVASC) 10 MG tablet Take 10 mg by mouth daily.   Yes Historical Provider, MD  aspirin 81 MG tablet Take 81 mg by mouth daily.   Yes Historical Provider, MD  Dextromethorphan-Quinidine (NUEDEXTA) 20-10 MG CAPS Take 1 capsule by mouth  2 (two) times daily.   Yes Historical Provider, MD  ferrous sulfate 325 (65 FE) MG tablet Take 325 mg by mouth daily with breakfast.   Yes Historical Provider, MD  gabapentin (NEURONTIN) 100 MG capsule Take 1 capsule (100 mg total) by mouth 3 (three) times daily. 05/06/15 05/05/16 Yes Elam Dutch, MD  HYDROcodone-acetaminophen (NORCO) 7.5-325 MG tablet Take 1 tablet by mouth every 6 (six) hours as needed. 01/24/16  Yes Carole Civil, MD  levothyroxine (SYNTHROID, LEVOTHROID) 75 MCG tablet Take 75 mcg by mouth daily.     Yes Historical Provider, MD  Multiple Vitamin (MULTIVITAMIN WITH MINERALS) TABS Take 1 tablet by mouth daily.   Yes Historical Provider, MD   BP 140/63 mmHg  Pulse 80  Temp(Src) 98.4 F (36.9 C) (Oral)  Resp 16  Ht 5' (1.524 m)  Wt 127 lb (57.607 kg)  BMI 24.80 kg/m2  SpO2 98% Physical Exam  Constitutional: She is oriented to person, place, and time. She appears well-developed and well-nourished.  HENT:  Head: Normocephalic and atraumatic.  Eyes: Conjunctivae are normal. Right eye exhibits no discharge. Left eye exhibits no discharge.  Neck: Normal range of motion. Neck supple. No tracheal deviation present.  Cardiovascular: Normal rate and regular rhythm.   Unable to palpate dorsalis pedis pulse bilateral. Weak posterior tibial on the left and unable to palpate posterior tibial on the right. Unable to palpate popliteal on the right. Warm feet bilateral.   Pulmonary/Chest: Effort normal and breath sounds normal.  Abdominal: Soft. She exhibits no distension. There is no tenderness. There is no guarding.  Musculoskeletal: She exhibits no edema.  Neurological: She is alert and oriented to person, place, and time.  Patient has 5+ strength with flexion-extension of knees and great toes and ankles. Patient has sensation to palpation in major nerves in lower extremities.  Skin: Skin is warm. No rash noted.  Psychiatric: She has a normal mood and affect.  Nursing note and  vitals reviewed.   ED Course  Procedures (including critical care time) Labs Review Labs Reviewed  CBG MONITORING, ED    Imaging Review No results found. I have personally reviewed and evaluated these images and lab results as part of my medical decision-making.   EKG Interpretation None      MDM   Final diagnoses:  Numbness   Patient presents with numbness and mild toe cool sensation in her feet for proximal me one week. Patient has known vascular disease. Plan for ultrasound to compare to previous to look for any changes. Feet are warm to palpation and patient well-appearing otherwise. No other neurologic concerns. No new neck or back pain.  Fup with vascular surgery.  Discussed with Dr. Scot Dock, worsened ABI, mild sxs for one week.  He recommends close outpt fup on Monday, he will have secretary contact her.  Continue ASA.  Results and differential diagnosis were discussed with the patient/parent/guardian. Xrays were independently reviewed by myself.  Close follow up outpatient was discussed, comfortable with the plan.   Medications -  No data to display  Filed Vitals:   02/18/16 1530 02/18/16 1600 02/18/16 1709 02/18/16 1730  BP: 120/63 130/68 162/75 146/78  Pulse: 76 65 87 92  Temp:      TempSrc:      Resp:   15   Height:      Weight:      SpO2: 96% 97% 100% 100%    Final diagnoses:  Numbness  Pain of right lower extremity  Peripheral vascular disease, unspecified (HCC)       Elnora Morrison, MD 02/18/16 [id number]

## 2016-02-18 NOTE — ED Notes (Signed)
Resident assisted to [facility]

## 2016-02-18 NOTE — Discharge Instructions (Signed)
Continue to take your aspirin. Very important to follow-up with vascular surgery on Monday. If you develop a weakness in your lower extremity, persistent cold sensation go to the emergency department or call your vascular surgeon.  If you were given medicines take as directed.  If you are on coumadin or contraceptives realize their levels and effectiveness is altered by many different medicines.  If you have any reaction (rash, tongues swelling, other) to the medicines stop taking and see a physician.    If your blood pressure was elevated in the ER make sure you follow up for management with a primary doctor or return for chest pain, shortness of breath or stroke symptoms.  Please follow up as directed and return to the ER or see a physician for new or worsening symptoms.  Thank you. Filed Vitals:   02/18/16 1530 02/18/16 1600 02/18/16 1709 02/18/16 1730  BP: 120/63 130/68 162/75 146/78  Pulse: 76 65 87 92  Temp:      TempSrc:      Resp:   15   Height:      Weight:      SpO2: 96% 97% 100% 100%

## 2016-02-21 ENCOUNTER — Encounter: Payer: Self-pay | Admitting: Family

## 2016-02-21 ENCOUNTER — Other Ambulatory Visit: Payer: Self-pay

## 2016-02-21 ENCOUNTER — Ambulatory Visit (INDEPENDENT_AMBULATORY_CARE_PROVIDER_SITE_OTHER): Payer: Medicare Other | Admitting: Family

## 2016-02-21 ENCOUNTER — Other Ambulatory Visit: Payer: Self-pay | Admitting: *Deleted

## 2016-02-21 VITALS — BP 100/58 | HR 100 | Temp 97.3°F | Resp 16 | Ht 60.0 in | Wt 121.0 lb

## 2016-02-21 DIAGNOSIS — Z95828 Presence of other vascular implants and grafts: Secondary | ICD-10-CM

## 2016-02-21 DIAGNOSIS — M5137 Other intervertebral disc degeneration, lumbosacral region: Secondary | ICD-10-CM

## 2016-02-21 DIAGNOSIS — S90811A Abrasion, right foot, initial encounter: Secondary | ICD-10-CM

## 2016-02-21 DIAGNOSIS — I739 Peripheral vascular disease, unspecified: Secondary | ICD-10-CM

## 2016-02-21 DIAGNOSIS — M419 Scoliosis, unspecified: Secondary | ICD-10-CM

## 2016-02-21 DIAGNOSIS — L089 Local infection of the skin and subcutaneous tissue, unspecified: Secondary | ICD-10-CM | POA: Diagnosis not present

## 2016-02-21 MED ORDER — HYDROCODONE-ACETAMINOPHEN 7.5-325 MG PO TABS: 1.0000 | ORAL_TABLET | Freq: Four times a day (QID) | ORAL | Status: DC | PRN

## 2016-02-21 MED ORDER — CEPHALEXIN 500 MG PO CAPS: 500.0000 mg | ORAL_CAPSULE | Freq: Two times a day (BID) | ORAL | Status: DC

## 2016-02-21 NOTE — Progress Notes (Signed)
[facility] [place] [facility]ORY [facility]ICAL -PAD  History of Present Illness [name] is a 82 y.o. female patient of Dr. Oneida Alar who is s/p right femoral to above knee popliteal bypass graft 2005; left femoral to below knee popliteal bypass graft 01/2009 with thrombectomy of the iliac artery. She also had an angioplasty of this in May of 2010 by Dr Trula Slade.   She returns today as follow up to ED visit at [facility] on 02/18/16 with c/o worsening numbness in both feet and cold sensation in her toes since about Wednesday 02/16/16. ABI's on 02/18/16 indicated decrease bilaterally: 0.51 in right (was 1.02) and 0.81 in left (was 1.07). Pt states the above sx's have not been getting worse.  She noticed an small lesion at the dorsal aspect of her right foot started hurting a few days ago.  She noticed pain in her right calf last night that kept her awake.  Creatinine on 02/18/16 was 1.99.   I last saw her on 12/30/15. At that time she had no claudication symptoms, no signs of ischemia in her feet/legs. That day's bilateral LE arterial duplex suggested widely patent bilateral femoropopliteal arterial bypass graft without evidence of restenosis or hyperplasia. No significant change compared to prior exam. ABI's had improved to normal in 6 months with tri and biphasic waveforms. She reported not walking much, no barriers to walking other than she gets tired of she walks too much. Nevertheless, her ABI's had improved to normal with no evidence of vessel calcification.   She had a venous duplex exam at [facility] at which time they noted some thrombus in her left femoropopliteal bypass graft. She was referred here for further followup regarding this.  The gabapentin helps "the funny feeling" in her legs at night. She has generalized stiffness every morning that resolves within 30 minutes. She does not seem to have claudication pain in her legs with walking, denies non healing  wounds, denies history of stroke or TIA.  She lost 15 pounds in a year, unintentional, states her appetite is good, denies post prandial abdominal pain. Pt states her PCP is aware of her weight loss. She has a colostomy for unknown reason, states she did not have cancer.  Pt Diabetic: Yes, diet controlled Pt smoker: non-smoker, never  Pt meds include: Statin :No ASA: No, sees a nephrologist Other anticoagulants/antiplatelets: no     Past Medical History  Diagnosis Date  . HTN (hypertension)   . Rotator cuff insufficiency 03/23/2011  . Arthritis   . Hyperlipidemia   . Diabetes mellitus without complication ([place])   . Depression   . Peripheral vascular disease ([place])   . Thyroid disease   . Hypothyroidism   . Cancer ([place])   . Femoral-femoral bypass graft thrombosis, left [facility])     Social History Social History  Substance Use Topics  . Smoking status: Never Smoker   . Smokeless tobacco: Never Used  . Alcohol Use: No    Family History Family History  Problem Relation Age of Onset  . Stroke Mother   . Diabetes Mother     Leg amputation  . Hypertension Mother   . Heart disease Father   . Heart attack Father   . Cancer Daughter     Past Surgical History  Procedure Laterality Date  . Vesicovaginal fistula closure w/ tah    . Carpal tunnel release      right side  . Tonsillectomy    . Cholecystectomy    .  Left shoulder hemiarthroplasty    . Appendectomy    . Grafts of bilateral lower extremities    . Colon surgery      sigmoid colectomy complicated by anastomotic leak  . Colostomy  transverse loop, 2006  . Abdominal hysterectomy    . Cataract extraction w/phaco Right 06/05/2013    Procedure: CATARACT EXTRACTION PHACO AND INTRAOCULAR LENS PLACEMENT (IOC);  Surgeon: Tonny Branch, MD;  Location: [facility];  Service: Ophthalmology;  Laterality: Right;  CDE: 10.58  . Cataract extraction w/phaco Left 07/10/2013    Procedure: CATARACT EXTRACTION PHACO AND INTRAOCULAR LENS  PLACEMENT (IOC);  Surgeon: Tonny Branch, MD;  Location: [facility];  Service: Ophthalmology;  Laterality: Left;  CDE: 17.35  . Eye surgery      No Known Allergies  Current Outpatient Prescriptions  Medication Sig Dispense Refill  . acetaminophen-codeine (TYLENOL #3) 300-30 MG tablet Take 1 tablet by mouth every 8 (eight) hours as needed for moderate pain.    Marland Kitchen amLODipine (NORVASC) 10 MG tablet Take 10 mg by mouth daily.    Marland Kitchen Dextromethorphan-Quinidine (NUEDEXTA) 20-10 MG CAPS Take 1 capsule by mouth 2 (two) times daily.    . ferrous sulfate 325 (65 FE) MG tablet Take 325 mg by mouth daily with breakfast.    . gabapentin (NEURONTIN) 100 MG capsule Take 1 capsule (100 mg total) by mouth 3 (three) times daily. 90 capsule 5  . HYDROcodone-acetaminophen (NORCO) 7.5-325 MG tablet Take 1 tablet by mouth every 6 (six) hours as needed. 120 tablet 0  . levothyroxine (SYNTHROID, LEVOTHROID) 75 MCG tablet Take 75 mcg by mouth daily.      . Multiple Vitamin (MULTIVITAMIN WITH MINERALS) TABS Take 1 tablet by mouth daily.    Marland Kitchen aspirin 81 MG tablet Take 81 mg by mouth daily. Reported on 02/21/2016     No current facility-administered medications for this visit.    ROS: See HPI for pertinent positives and negatives.   Physical Examination  Filed Vitals:   02/21/16 1047  BP: 100/58  Pulse: 100  Temp: 97.3 F (36.3 C)  TempSrc: Oral  Resp: 16  Height: 5' (1.524 m)  Weight: 121 lb (54.885 kg)  SpO2: 99%   Body mass index is 23.63 kg/(m^2).  General: A&O x 3, WDWN. Gait: normal Eyes: PERRLA. Pulmonary: CTAB, without wheezes , rales or rhonchi. Cardiac: regular rythm, no detected murmur.     Carotid Bruits Left Right   Negative Negative  Aorta is not palpable. Radial pulses: are 2+ palpable and =   VASCULAR EXAM: Extremities with ischemic changes: mild erythema surrounding small abrasion at dorsal aspect of metartasal arch. Feet and toes are  cool, without Gangrene; no other open wounds.     LE Pulses LEFT RIGHT   FEMORAL 3+ palpable 3+ palpable    POPLITEAL 2+ palpable  2+ palpable   POSTERIOR TIBIAL not palpable, no Doppler signal  not palpable,  + Doppler signal    DORSALIS PEDIS  ANTERIOR TIBIAL Not  Palpable,  + Doppler signal  not palpable, + Doppler signal        PERONEAL + Doppler signal    Abdomen: soft, NT, no masses, colostomy in RLQ with appliance in place. Skin: no rashes, no ulcers. Musculoskeletal: no muscle wasting or atrophy. Neurologic: A&O X 3; Appropriate Affect ; SENSATION: normal; MOTOR FUNCTION: moving all extremities equally, motor strength 4/5 throughout. Speech is fluent/normal. CN 2-12 intact.  ASSESSMENT: [name] is a 82 y.o. female who is s/p right femoral to above knee popliteal bypass graft 2005; left femoral to below knee popliteal bypass graft 01/2009 with thrombectomy of the iliac artery.   She is following up today after visit to [facility] ED on 02/18/16 for  worsening numbness in both feet and cold sensation in her toes since about Wednesday 02/16/16. ABI's on 02/18/16 indicated decrease bilaterally: 0.51 in right (was 1.02 in January 2017) and 0.81 in left (was 1.07). Pt states the above sx's have not been getting worse.  She noticed an small abrasion at the dorsal aspect of her right foot started hurting a few days ago, has mild/moderate surrounding erythema.  She noticed pain in her right calf last night that kept her awake.  She is already taking Norco for arthritis pain and states this helped ease her right calf and foot pain.  Creatinine on 02/18/16 was 1.99.      PLAN:  Based on the patient's vascular studies and examination, and after  discussing with Dr. Trula Slade, pt will be scheduled for CO2 angiogram either 3/22/or 02/24/16, with bilateral run off, possible right lower extremity intervention.   Keflex 500 mg bid x 7 days, disp #14 I discussed in depth with the patient the nature of atherosclerosis, and emphasized the importance of maximal medical management including strict control of blood pressure, blood glucose, and lipid levels, obtaining regular exercise, and continued cessation of smoking.  The patient is aware that without maximal medical management the underlying atherosclerotic disease process will progress, limiting the benefit of any interventions.  The patient was given information about PAD including signs, symptoms, treatment, what symptoms should prompt the patient to seek immediate medical care, and risk reduction measures to take.  Clemon Chambers, RN, MSN, FNP-C [facility] Phone: [phone]  Clinic MD: Trula Slade  02/21/2016 11:00 AM

## 2016-02-21 NOTE — Telephone Encounter (Signed)
Prescription available

## 2016-02-22 ENCOUNTER — Telehealth: Payer: Self-pay | Admitting: Vascular Surgery

## 2016-02-22 NOTE — Telephone Encounter (Signed)
-----   Message from Viann Fish, NP sent at 02/22/2016  2:19 PM EDT ----- Regarding: cancel angiogram I spoke with Dr. Oneida Alar. Pt's comorbidities pose signficant risk for any procedure.  He would like to cancel the angiogram and instead see her in the office in a couple of weeks.  Thank you, Vinnie Level

## 2016-02-22 NOTE — Telephone Encounter (Signed)
Arbie Cookey is calling pt to cancel angiogram. She will notify pt of appt on 04/05 @ 1:30, dpm

## 2016-02-23 ENCOUNTER — Ambulatory Visit (HOSPITAL_COMMUNITY): Admission: RE | Admit: 2016-02-23 | Payer: Medicare Other | Source: Ambulatory Visit | Admitting: Vascular Surgery

## 2016-02-23 ENCOUNTER — Telehealth: Payer: Self-pay | Admitting: Vascular Surgery

## 2016-02-23 ENCOUNTER — Encounter (HOSPITAL_COMMUNITY): Admission: RE | Payer: Self-pay | Source: Ambulatory Visit

## 2016-02-23 SURGERY — ABDOMINAL AORTOGRAM
Anesthesia: LOCAL

## 2016-02-23 NOTE — Telephone Encounter (Signed)
-----   Message from Denman George, RN sent at 02/22/2016  5:44 PM EDT ----- Regarding: cancellation list request Please place pt. on cancellation list for poss. Appt. 03/02/16 with Dr. Oneida Alar, per the request of her niece [place] @ [phone]. (she is currently scheduled to f/u with him on Wed., 03/08/16)

## 2016-02-28 ENCOUNTER — Encounter: Payer: Self-pay | Admitting: Orthopedic Surgery

## 2016-02-28 ENCOUNTER — Ambulatory Visit (INDEPENDENT_AMBULATORY_CARE_PROVIDER_SITE_OTHER): Payer: Medicare Other

## 2016-02-28 ENCOUNTER — Ambulatory Visit (INDEPENDENT_AMBULATORY_CARE_PROVIDER_SITE_OTHER): Payer: Medicare Other | Admitting: Orthopedic Surgery

## 2016-02-28 VITALS — BP 126/58 | Ht 60.0 in | Wt 121.0 lb

## 2016-02-28 DIAGNOSIS — G894 Chronic pain syndrome: Secondary | ICD-10-CM | POA: Diagnosis not present

## 2016-02-28 DIAGNOSIS — M129 Arthropathy, unspecified: Secondary | ICD-10-CM

## 2016-02-28 DIAGNOSIS — M19019 Primary osteoarthritis, unspecified shoulder: Secondary | ICD-10-CM

## 2016-02-28 NOTE — Progress Notes (Signed)
Patient ID: [name], female   DOB: January 17, 1926, 82 y.o.   MRN: [id number]  Chief Complaint  Patient presents with  . Follow-up    follow up + xray left partial shoulder replacement, 2005    HPI the patient is now 12 years after partial shoulder replacement for rotator cuff tear induced arthropathy. She's doing reasonably well on hydrocodone 7.5 mg with bilateral shoulder pain she is undergoing physical therapy  Review of Systems  Constitutional: Negative for fever and chills.  Musculoskeletal: Positive for neck pain.  Neurological: Negative for tingling.   Past Medical History  Diagnosis Date  . HTN (hypertension)   . Rotator cuff insufficiency 03/23/2011  . Arthritis   . Hyperlipidemia   . Diabetes mellitus without complication ([place])   . Depression   . Peripheral vascular disease ([place])   . Thyroid disease   . Hypothyroidism   . Cancer ([place])   . Femoral-femoral bypass graft thrombosis, left (HCC)     BP 126/58 mmHg  Ht 5' (1.524 m)  Wt 121 lb (54.885 kg)  BMI 23.63 kg/m2  Physical Exam  Constitutional: She is oriented to person, place, and time. She appears well-developed and well-nourished. No distress.  Cardiovascular: Normal rate and intact distal pulses.   Neurological: She is alert and oriented to person, place, and time. She has normal reflexes. She exhibits normal muscle tone. Coordination normal.  Skin: Skin is warm and dry. No rash noted. She is not diaphoretic. No erythema. No pallor.  Psychiatric: She has a normal mood and affect. Her behavior is normal. Judgment and thought content normal.    Ortho Exam  Active elevation of the left shoulder is only about 70 she has pseudoparalysis there. Passive range of motion is painful to 120. She has no strength in this arm in terms of her rotator cuff except with internal and external rotation. Her scar healed well.There is no tenderness around the shoulder. The shoulder is stable to the inferior subluxation test  were communicated to abduction external rotation position there is no surrounding tenderness or swelling  Right shoulder painful tenderness on the anterior joint line decreased range of motion but no instability. Strength is noted for normal internal and external rotation strength weak abduction scans intact sensations normal pulses are excellent.  ASSESSMENT AND PLAN   Today's plain films show no abnormality in the prosthesis  The patient also has chronic pain  I gave her appropriate precautions for opioids. We tried to get a urine sample but she cannot give one we will get one on the next visit. She has not indicated any signs and symptoms of abuse of the medication. I did advise that we increase the medication to every 4 hours instead of every 6 as the every 6 hour dosing is not holding her in terms of her overall pain relief which is interfering with her function in terms of her activities of daily living.

## 2016-02-29 ENCOUNTER — Encounter: Payer: Self-pay | Admitting: Vascular Surgery

## 2016-03-08 ENCOUNTER — Ambulatory Visit (INDEPENDENT_AMBULATORY_CARE_PROVIDER_SITE_OTHER): Payer: Medicare Other | Admitting: Vascular Surgery

## 2016-03-08 ENCOUNTER — Encounter: Payer: Self-pay | Admitting: Vascular Surgery

## 2016-03-08 VITALS — BP 134/62 | HR 58 | Temp 96.9°F | Resp 16 | Ht 60.0 in | Wt 121.0 lb

## 2016-03-08 DIAGNOSIS — Z48812 Encounter for surgical aftercare following surgery on the circulatory system: Secondary | ICD-10-CM

## 2016-03-08 DIAGNOSIS — I70239 Atherosclerosis of native arteries of right leg with ulceration of unspecified site: Secondary | ICD-10-CM | POA: Diagnosis not present

## 2016-03-08 DIAGNOSIS — I70249 Atherosclerosis of native arteries of left leg with ulceration of unspecified site: Secondary | ICD-10-CM

## 2016-03-08 NOTE — Progress Notes (Signed)
Patient is an 82 year old female who returns today for follow-up. She has previously undergone left femoral popliteal bypass and revision to the below-knee popliteal artery several years ago. These were all want him by Dr. Nils Pyle. She has also previously had a right femoral to above-knee popliteal bypass by Dr. Nils Pyle in the past. We have followed her for several years and she has remained asymptomatic. She recently had an arterial duplex exam performed for numbness and tingling in both feet. She was noted to have occlusion of her right femoropopliteal bypass graft. ABIs that time were 0.5 on the right 0.8 on the left. She continues to describe numbness and tingling in both feet. She occasionally complains of some cramping in her right calf when she walks. However she walks minimally around the house really only to get from one room to the other and does not really ambulate a significant amount. Her primary complaint is of numbness and tingling in the feet. She has no history of nonhealing wounds. She does not describe rest pain in the right foot. She does complain of pain in both feet at night time but they are similar no difference between right and left. Chronic medical problems include hyperlipidemia, diabetes, renal insufficiency. Her most recent serum creatinine was now 2.0 range. She is on aspirin 81 mg daily.  Review of systems: She has shortness of breath with exertion. She denies chest pain.  Past Medical History  Diagnosis Date  . HTN (hypertension)   . Rotator cuff insufficiency 03/23/2011  . Arthritis   . Hyperlipidemia   . Diabetes mellitus without complication ([place])   . Depression   . Peripheral vascular disease ([place])   . Thyroid disease   . Hypothyroidism   . Cancer ([place])   . Femoral-femoral bypass graft thrombosis, left [facility])    Past Surgical History  Procedure Laterality Date  . Vesicovaginal fistula closure w/ tah    . Carpal tunnel release      right side  . Tonsillectomy     . Cholecystectomy    . Left shoulder hemiarthroplasty    . Appendectomy    . Grafts of bilateral lower extremities    . Colon surgery      sigmoid colectomy complicated by anastomotic leak  . Colostomy  transverse loop, 2006  . Abdominal hysterectomy    . Cataract extraction w/phaco Right 06/05/2013    Procedure: CATARACT EXTRACTION PHACO AND INTRAOCULAR LENS PLACEMENT (IOC);  Surgeon: Tonny Branch, MD;  Location: [facility];  Service: Ophthalmology;  Laterality: Right;  CDE: 10.58  . Cataract extraction w/phaco Left 07/10/2013    Procedure: CATARACT EXTRACTION PHACO AND INTRAOCULAR LENS PLACEMENT (IOC);  Surgeon: Tonny Branch, MD;  Location: [facility];  Service: Ophthalmology;  Laterality: Left;  CDE: 17.35  . Eye surgery     Current Outpatient Prescriptions on File Prior to Visit  Medication Sig Dispense Refill  . acetaminophen-codeine (TYLENOL #3) 300-30 MG tablet Take 1 tablet by mouth every 8 (eight) hours as needed for moderate pain.    Marland Kitchen amLODipine (NORVASC) 10 MG tablet Take 10 mg by mouth daily.    Marland Kitchen aspirin 81 MG tablet Take 81 mg by mouth daily. Reported on 02/21/2016    . Dextromethorphan-Quinidine (NUEDEXTA) 20-10 MG CAPS Take 1 capsule by mouth 2 (two) times daily.    . ferrous sulfate 325 (65 FE) MG tablet Take 325 mg by mouth daily with breakfast.    . gabapentin (NEURONTIN) 100 MG capsule Take 1 capsule (100 mg total)  by mouth 3 (three) times daily. 90 capsule 5  . HYDROcodone-acetaminophen (NORCO) 7.5-325 MG tablet Take 1 tablet by mouth every 6 (six) hours as needed. 120 tablet 0  . levothyroxine (SYNTHROID, LEVOTHROID) 75 MCG tablet Take 75 mcg by mouth daily.      . Multiple Vitamin (MULTIVITAMIN WITH MINERALS) TABS Take 1 tablet by mouth daily.     No current facility-administered medications on file prior to visit.   Physical exam:  Filed Vitals:   03/08/16 1350  BP: 134/62  Pulse: 58  Temp: 96.9 F (36.1 C)  TempSrc: Oral  Resp: 16  Height: 5' (1.524 m)  Weight: 121  lb (54.885 kg)  SpO2: 98%    Extremities: 2+ femoral pulses bilaterally absent pedal pulses bilaterally  Skin: No open wounds ulcers  Assessment: Patient with what may be some mild symptoms of claudication in her right lower extremity. However her ABIs 0.5 which should be reasonable enough circulation to prevent her from having limb loss. She primarily complains of numbness and tingling which is present in both feet which is more consistent with neuropathy. Her left ABIs in the 0.8 range which again should be adequate perfusion. The patient is 82 years old and I do not believe a very good candidate for general anesthetic and left she were at risk of limb loss. I have encouraged her to try to walk for 30 minutes daily. If the cramping sensation in her right calf gets worse we could consider further evaluation with an arteriogram. However she will not be a candidate for percutaneous intervention and if we decide to pursue this further the only thing she would be a candidate for would be a redo bypass which I think would be very hard on her at her age and her current condition. She also would be high risk for contrast nephropathy and possible dialysis with her elevated creatinine. I also discussed this with the patient and her friend today. She is not significantly disabled by her current symptoms. Unless the patient worsens to the point where she is at risk of limb loss we will continue on entering her to make sure she does not backslide any she will follow-up with [place] in 4-6 weeks with repeat ABIs.  Plan: See above  Ruta Hinds, MD [facility] [place] Office: [phone] Pager: [phone]

## 2016-03-24 DIAGNOSIS — Z933 Colostomy status: Secondary | ICD-10-CM | POA: Diagnosis not present

## 2016-03-31 ENCOUNTER — Other Ambulatory Visit: Payer: Self-pay | Admitting: *Deleted

## 2016-03-31 ENCOUNTER — Telehealth: Payer: Self-pay | Admitting: Orthopedic Surgery

## 2016-03-31 DIAGNOSIS — M419 Scoliosis, unspecified: Secondary | ICD-10-CM

## 2016-03-31 DIAGNOSIS — M5137 Other intervertebral disc degeneration, lumbosacral region: Secondary | ICD-10-CM

## 2016-03-31 MED ORDER — HYDROCODONE-ACETAMINOPHEN 7.5-325 MG PO TABS: 1.0000 | ORAL_TABLET | Freq: Four times a day (QID) | ORAL | Status: DC | PRN

## 2016-03-31 NOTE — Telephone Encounter (Signed)
DONE

## 2016-03-31 NOTE — Telephone Encounter (Signed)
Hydrocodone-Acetaminophen 7.5/325mg   Qty 120 Tablets  Take 1 tablet by mouth every 6 (six) hours as needed for pain

## 2016-04-26 ENCOUNTER — Encounter: Payer: Self-pay | Admitting: Vascular Surgery

## 2016-04-28 ENCOUNTER — Ambulatory Visit (HOSPITAL_COMMUNITY)
Admission: RE | Admit: 2016-04-28 | Discharge: 2016-04-28 | Disposition: A | Discharge status: Home / Self Care | Payer: Medicare Other | Source: Ambulatory Visit | Attending: Vascular Surgery | Admitting: Vascular Surgery

## 2016-04-28 DIAGNOSIS — R938 Abnormal findings on diagnostic imaging of other specified body structures: Secondary | ICD-10-CM | POA: Diagnosis not present

## 2016-04-28 DIAGNOSIS — R0989 Other specified symptoms and signs involving the circulatory and respiratory systems: Secondary | ICD-10-CM | POA: Diagnosis present

## 2016-04-28 DIAGNOSIS — I70249 Atherosclerosis of native arteries of left leg with ulceration of unspecified site: Secondary | ICD-10-CM | POA: Diagnosis not present

## 2016-04-28 DIAGNOSIS — E119 Type 2 diabetes mellitus without complications: Secondary | ICD-10-CM | POA: Diagnosis not present

## 2016-04-28 DIAGNOSIS — Z48812 Encounter for surgical aftercare following surgery on the circulatory system: Secondary | ICD-10-CM | POA: Insufficient documentation

## 2016-04-28 DIAGNOSIS — I70239 Atherosclerosis of native arteries of right leg with ulceration of unspecified site: Secondary | ICD-10-CM | POA: Insufficient documentation

## 2016-04-28 DIAGNOSIS — I10 Essential (primary) hypertension: Secondary | ICD-10-CM | POA: Diagnosis not present

## 2016-04-28 DIAGNOSIS — E785 Hyperlipidemia, unspecified: Secondary | ICD-10-CM | POA: Insufficient documentation

## 2016-04-28 DIAGNOSIS — I739 Peripheral vascular disease, unspecified: Secondary | ICD-10-CM | POA: Insufficient documentation

## 2016-05-02 ENCOUNTER — Telehealth: Payer: Self-pay | Admitting: Orthopedic Surgery

## 2016-05-02 NOTE — Telephone Encounter (Signed)
Patient's nephew, designated contact Darden Amber, called to request refill on patient's medication: HYDROcodone-acetaminophen ([place]) 7.5-325 MG tablet [id number] - please advise.

## 2016-05-03 ENCOUNTER — Other Ambulatory Visit: Payer: Self-pay | Admitting: *Deleted

## 2016-05-03 DIAGNOSIS — M419 Scoliosis, unspecified: Secondary | ICD-10-CM

## 2016-05-03 DIAGNOSIS — M5137 Other intervertebral disc degeneration, lumbosacral region: Secondary | ICD-10-CM

## 2016-05-03 MED ORDER — HYDROCODONE-ACETAMINOPHEN 7.5-325 MG PO TABS: 1.0000 | ORAL_TABLET | Freq: Four times a day (QID) | ORAL | Status: DC | PRN

## 2016-05-04 ENCOUNTER — Ambulatory Visit (INDEPENDENT_AMBULATORY_CARE_PROVIDER_SITE_OTHER): Payer: Medicare Other | Admitting: Vascular Surgery

## 2016-05-04 ENCOUNTER — Telehealth: Payer: Self-pay | Admitting: Vascular Surgery

## 2016-05-04 ENCOUNTER — Encounter: Payer: Self-pay | Admitting: Vascular Surgery

## 2016-05-04 VITALS — BP 126/68 | HR 84 | Temp 97.3°F | Resp 14 | Ht 60.0 in | Wt 117.0 lb

## 2016-05-04 DIAGNOSIS — I70211 Atherosclerosis of native arteries of extremities with intermittent claudication, right leg: Secondary | ICD-10-CM | POA: Diagnosis not present

## 2016-05-04 NOTE — Progress Notes (Signed)
[facility] [place]  Patient name: [name] MRN: [id number] DOB: 12/23/1925 Sex: female  REASON FOR VISIT: Follow-up, repeat ABIs  HPI: [name] is a 82 y.o. female who presents for follow-up today. She was last seen in the office on 03/08/2016. She has a history of left femoral popliteal bypass with revision several years ago. She also previously had a right femoral to above-knee popliteal bypass in the past. These both were done by Dr. Nils Pyle. She had a recent arterial duplex performed secondary to numbness and tingling in both of her feet. This revealed an occluded right femoral-popliteal bypass. She also complained of right calf claudication.  The patient was advised to start walking 30 minutes daily and return in 1 month for repeat ABIs. Today, the patient states that she still has numbness of her toes bilaterally. She continues to have right calf claudication and has not been ambulating secondarily to this. She is able to walk about a block before having cramping in her right calf. She is afraid that her gabapentin contributed to the occlusion of her right femoropopliteal bypass. She also reports some scabs that are healing on her right second and third toes and left third toe. This occurred 2-3 weeks ago. She does not know what she did to cause this.   She is not a smoker.  Past Medical History  Diagnosis Date  . HTN (hypertension)   . Rotator cuff insufficiency 03/23/2011  . Arthritis   . Hyperlipidemia   . Diabetes mellitus without complication ([place])   . Depression   . Peripheral vascular disease ([place])   . Thyroid disease   . Hypothyroidism   . Cancer ([place])   . Femoral-femoral bypass graft thrombosis, left ([facility])     Family History  Problem Relation Age of Onset  . Stroke Mother   . Diabetes Mother     Leg amputation  . Hypertension Mother   . Heart disease Father   . Heart attack Father   . Cancer Daughter     SOCIAL  HISTORY: Social History  Substance Use Topics  . Smoking status: Never Smoker   . Smokeless tobacco: Never Used  . Alcohol Use: No    No Known Allergies  Current Outpatient Prescriptions  Medication Sig Dispense Refill  . acetaminophen-codeine (TYLENOL #3) 300-30 MG tablet Take 1 tablet by mouth every 8 (eight) hours as needed for moderate pain.    Marland Kitchen amLODipine (NORVASC) 10 MG tablet Take 10 mg by mouth daily.    Marland Kitchen aspirin 81 MG tablet Take 81 mg by mouth daily. Reported on 02/21/2016    . Dextromethorphan-Quinidine (NUEDEXTA) 20-10 MG CAPS Take 1 capsule by mouth 2 (two) times daily.    . ferrous sulfate 325 (65 FE) MG tablet Take 325 mg by mouth daily with breakfast.    . gabapentin (NEURONTIN) 100 MG capsule Take 1 capsule (100 mg total) by mouth 3 (three) times daily. 90 capsule 5  . HYDROcodone-acetaminophen (NORCO) 7.5-325 MG tablet Take 1 tablet by mouth every 6 (six) hours as needed. 120 tablet 0  . levothyroxine (SYNTHROID, LEVOTHROID) 75 MCG tablet Take 75 mcg by mouth daily.      . Multiple Vitamin (MULTIVITAMIN WITH MINERALS) TABS Take 1 tablet by mouth daily.     No current facility-administered medications for this visit.    REVIEW OF SYSTEMS:  [X]  denotes positive finding, [ ]  denotes negative finding Cardiac  Comments:  Chest pain or chest pressure:  Shortness of breath upon exertion:    Short of breath when lying flat:    Irregular heart rhythm:        Vascular    Pain in calf, thigh, or hip brought on by ambulation: x   Pain in feet at night that wakes you up from your sleep:  x   Blood clot in your veins:    Leg swelling:         Pulmonary    Oxygen at home:    Productive cough:     Wheezing:         Neurologic    Sudden weakness in arms or legs:     Sudden numbness in arms or legs:     Sudden onset of difficulty speaking or slurred speech:    Temporary loss of vision in one eye:     Problems with dizziness:         Gastrointestinal    Blood in  stool:     Vomited blood:         Genitourinary    Burning when urinating:     Blood in urine:        Psychiatric    Major depression:         Hematologic    Bleeding problems:    Problems with blood clotting too easily:        Skin    Rashes or ulcers:        Constitutional    Fever or chills:      PHYSICAL EXAM: Filed Vitals:   05/04/16 1414  BP: 126/68  Pulse: 84  Temp: 97.3 F (36.3 C)  Resp: 14  Height: 5' (1.524 m)  Weight: 117 lb (53.071 kg)  SpO2: 100%    GENERAL: The patient is a well-nourished female, in no acute distress. The vital signs are documented above. CARDIAC: There is a regular rate and rhythm.  VASCULAR: Nonpalpable pedal pulses bilaterally. Feet are warm and pink bilaterally. Small 1 mm scabs on right second and third toes and left third toe healing. These appear to be from rubbing from ill fitting shoes. Ulcerations. PULMONARY: There is good air exchange bilaterally without wheezing or rales. MUSCULOSKELETAL: There are no major deformities or cyanosis. NEUROLOGIC: No focal weakness or paresthesias are detected. SKIN: There are no ulcers or rashes noted. PSYCHIATRIC: The patient has a normal affect.  DATA:  ABIs 04/28/2016  Right: 0.52 with monophasic posterior tibial and dorsalis pedis waveforms. TBI: 0.56. Left: 0.95 with biphasic posterior tibial and dorsalis pedis waveforms. Left TBI: 1.0.  MEDICAL ISSUES: Known occluded right femoral-popliteal bypass with mild right calf claudication Left femoral-popliteal bypass 2010  The patient has not been ambulating secondary to right calf claudication. The patient is able to tolerate her pain. Continue conservative therapy. Encouraged her to continue 30 minutes of walking daily to increase collateralization.   She does have some scabs on her toes bilaterally. These appear to be rubbing from ill fitting shoes. These have been present for 2-3 weeks. Advised the patient to follow-up in 4-6 weeks if  her wounds do not heal. Otherwise, follow up in 6 months with repeat ABIs.  If her symptoms worsen, we'll need to consider arteriography. She would be at high risk for contrast nephropathy given her elevated creatinine.  Virgina Jock, PA-C [facility] [place]  History and exam findings as above. Patient has a very superficial abrasion on the dorsum of her right toe. This should heal  spontaneously. I told her to call me if it has not healed in one month and we will consider further evaluation. However the patient is 82 years old and has known underlying renal dysfunction And would be at high risk for contrast nephropathy. I would not consider revascularization in her for Claudication symptoms alone. If the wound continues to heal spontaneously she will follow-up with [place] in 6 months time.  Ruta Hinds, MD [facility] [place] Office: [phone] Pager: [phone]

## 2016-05-04 NOTE — Telephone Encounter (Signed)
Per blue dc sheet 05/04/16 pt will call in 2-3 weeks if wound not healing, if she calls, please scheduled for 4-6 weeks from 05/04/16 for follow up with Dr. Oneida Alar.

## 2016-05-16 DIAGNOSIS — Z933 Colostomy status: Secondary | ICD-10-CM | POA: Diagnosis not present

## 2016-05-22 ENCOUNTER — Other Ambulatory Visit: Payer: Self-pay | Admitting: *Deleted

## 2016-05-22 DIAGNOSIS — M419 Scoliosis, unspecified: Secondary | ICD-10-CM

## 2016-05-22 DIAGNOSIS — M5137 Other intervertebral disc degeneration, lumbosacral region: Secondary | ICD-10-CM

## 2016-05-22 MED ORDER — GABAPENTIN 100 MG PO CAPS: 100.0000 mg | ORAL_CAPSULE | Freq: Three times a day (TID) | ORAL | Status: DC

## 2016-05-22 NOTE — Telephone Encounter (Signed)
Gabapentin 100mg  refilled to [facility] per Dr. Oneida Alar.

## 2016-05-29 ENCOUNTER — Encounter: Payer: Self-pay | Admitting: Orthopedic Surgery

## 2016-05-29 ENCOUNTER — Ambulatory Visit (INDEPENDENT_AMBULATORY_CARE_PROVIDER_SITE_OTHER): Payer: Medicare Other | Admitting: Orthopedic Surgery

## 2016-05-29 VITALS — BP 130/60 | Ht 60.0 in | Wt 117.0 lb

## 2016-05-29 DIAGNOSIS — G894 Chronic pain syndrome: Secondary | ICD-10-CM

## 2016-05-29 MED ORDER — IBUPROFEN 400 MG PO TABS: 400.0000 mg | ORAL_TABLET | Freq: Four times a day (QID) | ORAL | Status: DC | PRN

## 2016-05-29 NOTE — Progress Notes (Signed)
Chief Complaint  Patient presents with  . Follow-up    Recheck for chronic pain.   The patient says her pain in her shoulders becoming worse and she's also having decreased range of motion with painful forward elevation in both shoulders  She's concerned she is 82 years old she had a major anesthetic issue last time she had surgery and eventually had to be transferred to [facility] and colostomy bag was placed  Review of systems multiple joint aches nothing specific legs still working well.  Past Medical History  Diagnosis Date  . HTN (hypertension)   . Rotator cuff insufficiency 03/23/2011  . Arthritis   . Hyperlipidemia   . Diabetes mellitus without complication ([place])   . Depression   . Peripheral vascular disease ([place])   . Thyroid disease   . Hypothyroidism   . Cancer ([place])   . Femoral-femoral bypass graft thrombosis, left ([facility])     Chronic pain bilateral shoulders Rotator cuff induced arthritis both shoulders  Recommended ibuprofen 4 mg every 6 continue hydrocodone 7.5 mg follow-up 3 months urine drug screen

## 2016-06-27 ENCOUNTER — Telehealth: Payer: Self-pay | Admitting: Orthopedic Surgery

## 2016-06-27 NOTE — Telephone Encounter (Signed)
ROUTING TO DR HARRISON FOR APPROVAL AND QUANTITY

## 2016-06-27 NOTE — Telephone Encounter (Signed)
Hydrocodone-Acetaminophen 7.5/325mg   Qty 120 Tablets  Take 1 tablet by mouth every 6 (six) hours as needed.

## 2016-06-29 ENCOUNTER — Other Ambulatory Visit: Payer: Self-pay | Admitting: Orthopedic Surgery

## 2016-06-29 DIAGNOSIS — M5137 Other intervertebral disc degeneration, lumbosacral region: Secondary | ICD-10-CM

## 2016-06-29 DIAGNOSIS — M419 Scoliosis, unspecified: Secondary | ICD-10-CM

## 2016-06-29 DIAGNOSIS — Z933 Colostomy status: Secondary | ICD-10-CM | POA: Diagnosis not present

## 2016-06-29 MED ORDER — HYDROCODONE-ACETAMINOPHEN 7.5-325 MG PO TABS
1.0000 | ORAL_TABLET | Freq: Four times a day (QID) | ORAL | 0 refills | Status: DC | PRN
Start: 2016-06-29 — End: 2016-07-31

## 2016-07-03 DIAGNOSIS — H40013 Open angle with borderline findings, low risk, bilateral: Secondary | ICD-10-CM | POA: Diagnosis not present

## 2016-07-03 DIAGNOSIS — H52223 Regular astigmatism, bilateral: Secondary | ICD-10-CM | POA: Diagnosis not present

## 2016-07-03 DIAGNOSIS — H5202 Hypermetropia, left eye: Secondary | ICD-10-CM | POA: Diagnosis not present

## 2016-07-31 ENCOUNTER — Telehealth: Payer: Self-pay | Admitting: Orthopedic Surgery

## 2016-07-31 ENCOUNTER — Other Ambulatory Visit: Payer: Self-pay | Admitting: *Deleted

## 2016-07-31 DIAGNOSIS — M419 Scoliosis, unspecified: Secondary | ICD-10-CM

## 2016-07-31 DIAGNOSIS — M5137 Other intervertebral disc degeneration, lumbosacral region: Secondary | ICD-10-CM

## 2016-07-31 MED ORDER — HYDROCODONE-ACETAMINOPHEN 7.5-325 MG PO TABS: 1.0000 | ORAL_TABLET | Freq: Four times a day (QID) | ORAL | 0 refills | Status: DC | PRN

## 2016-07-31 NOTE — Telephone Encounter (Signed)
ROUTING TO DR HARRISON FOR APPROVAL 

## 2016-07-31 NOTE — Telephone Encounter (Signed)
Patient called and requested a refill on Hydrocodone/Acetaminophen ([place])  7.5-325 mgs.  Qty 60       Sig: Take 1 tablet by mouth every 6 (six) hours as needed.

## 2016-07-31 NOTE — Telephone Encounter (Signed)
APPROVED

## 2016-08-14 DIAGNOSIS — Z933 Colostomy status: Secondary | ICD-10-CM | POA: Diagnosis not present

## 2016-08-29 ENCOUNTER — Ambulatory Visit (INDEPENDENT_AMBULATORY_CARE_PROVIDER_SITE_OTHER): Payer: Medicare Other | Admitting: Orthopedic Surgery

## 2016-08-29 VITALS — BP 155/69 | HR 79 | Ht 60.0 in | Wt 125.0 lb

## 2016-08-29 DIAGNOSIS — M5137 Other intervertebral disc degeneration, lumbosacral region: Secondary | ICD-10-CM | POA: Diagnosis not present

## 2016-08-29 DIAGNOSIS — M419 Scoliosis, unspecified: Secondary | ICD-10-CM

## 2016-08-29 DIAGNOSIS — Z7189 Other specified counseling: Secondary | ICD-10-CM | POA: Diagnosis not present

## 2016-08-29 DIAGNOSIS — G8929 Other chronic pain: Secondary | ICD-10-CM

## 2016-08-29 DIAGNOSIS — M4126 Other idiopathic scoliosis, lumbar region: Secondary | ICD-10-CM | POA: Diagnosis not present

## 2016-08-29 MED ORDER — HYDROCODONE-ACETAMINOPHEN 7.5-325 MG PO TABS: 1.0000 | ORAL_TABLET | ORAL | 0 refills | Status: DC | PRN

## 2016-08-29 NOTE — Progress Notes (Signed)
Patient ID: ISSABELLE MCRANEY, female   DOB: 04-30-1926, 82 y.o.   MRN: [id number]  Chief Complaint  Patient presents with  . Follow-up    3 month recheck on chronic pain.    HPI KIMANH TEMPLEMAN is a 82 y.o. female.   HPI  82 year old female with chronic pain nonoperative candidate for shoulders and history of lower back pain also nonoperative candidate.  Attempted decreasing her opioid 2 every 6 hours. Patient did not get good pain relief. She also takes ibuprofen.  Review of Systems Review of Systems  Musculoskeletal: Positive for back pain, gait problem, joint swelling and myalgias.    Physical Exam BP (!) 155/69   Pulse 79   Ht 5' (1.524 m)   Wt 125 lb (56.7 kg)   BMI 24.41 kg/m   Prescription for Norco 7.5 mg every 4  Patient will give urine drug screen  Databank has been checked  Follow-up in 3 months

## 2016-08-31 ENCOUNTER — Other Ambulatory Visit: Payer: Self-pay | Admitting: Orthopedic Surgery

## 2016-08-31 ENCOUNTER — Other Ambulatory Visit: Payer: Self-pay | Admitting: *Deleted

## 2016-08-31 DIAGNOSIS — G8929 Other chronic pain: Secondary | ICD-10-CM

## 2016-08-31 DIAGNOSIS — Z7189 Other specified counseling: Secondary | ICD-10-CM | POA: Diagnosis not present

## 2016-09-01 LAB — PAIN MGMT, PROFILE 1 W/O CONF, U
Amphetamines: NEGATIVE ng/mL (ref <500)
Barbiturates: NEGATIVE ng/mL (ref <300)
Benzodiazepines: NEGATIVE ng/mL (ref <100)
Cocaine Metabolite: NEGATIVE ng/mL (ref <150)
Creatinine: 122 mg/dL (ref >=20.0)
Marijuana Metabolite: NEGATIVE ng/mL (ref <20)
Methadone Metabolite: NEGATIVE ng/mL (ref <100)
Opiates: NEGATIVE ng/mL (ref <100)
Oxidant: NEGATIVE ug/mL (ref <200)
Oxycodone: NEGATIVE ng/mL (ref <100)
Phencyclidine: NEGATIVE ng/mL (ref <25)
pH: 6.57 (ref 4.5–9.0)

## 2016-10-02 ENCOUNTER — Telehealth: Payer: Self-pay | Admitting: Orthopedic Surgery

## 2016-10-02 ENCOUNTER — Other Ambulatory Visit: Payer: Self-pay | Admitting: Orthopedic Surgery

## 2016-10-02 DIAGNOSIS — M5137 Other intervertebral disc degeneration, lumbosacral region: Secondary | ICD-10-CM

## 2016-10-02 MED ORDER — HYDROCODONE-ACETAMINOPHEN 7.5-325 MG PO TABS: 1.0000 | ORAL_TABLET | ORAL | 0 refills | Status: DC | PRN

## 2016-10-02 NOTE — Telephone Encounter (Signed)
Patient requests refill:  HYDROcodone-acetaminophen (NORCO) 7.5-325 MG tablet 90 tablet

## 2016-10-02 NOTE — Telephone Encounter (Signed)
ROUTING TO DR HARRISON FOR APPROVAL 

## 2016-10-06 DIAGNOSIS — Z933 Colostomy status: Secondary | ICD-10-CM | POA: Diagnosis not present

## 2016-11-01 ENCOUNTER — Telehealth: Payer: Self-pay | Admitting: Orthopedic Surgery

## 2016-11-01 ENCOUNTER — Other Ambulatory Visit: Payer: Self-pay | Admitting: Orthopedic Surgery

## 2016-11-01 DIAGNOSIS — M5137 Other intervertebral disc degeneration, lumbosacral region: Secondary | ICD-10-CM

## 2016-11-01 MED ORDER — HYDROCODONE-ACETAMINOPHEN 7.5-325 MG PO TABS: 1.0000 | ORAL_TABLET | ORAL | 0 refills | Status: DC | PRN

## 2016-11-01 NOTE — Telephone Encounter (Signed)
ROUTING TO DR HARRISON FOR APPROVAL 

## 2016-11-01 NOTE — Telephone Encounter (Signed)
Refill request: HYDROcodone-acetaminophen (NORCO) 7.5-325 MG tablet 90 tablet

## 2016-11-01 NOTE — Progress Notes (Signed)
REFILL 

## 2016-11-06 ENCOUNTER — Encounter: Payer: Self-pay | Admitting: Vascular Surgery

## 2016-11-06 ENCOUNTER — Other Ambulatory Visit: Payer: Self-pay | Admitting: *Deleted

## 2016-11-06 DIAGNOSIS — M25579 Pain in unspecified ankle and joints of unspecified foot: Secondary | ICD-10-CM

## 2016-11-07 DIAGNOSIS — Z933 Colostomy status: Secondary | ICD-10-CM | POA: Diagnosis not present

## 2016-11-09 ENCOUNTER — Encounter: Payer: Self-pay | Admitting: Vascular Surgery

## 2016-11-09 ENCOUNTER — Ambulatory Visit (HOSPITAL_COMMUNITY)
Admission: RE | Admit: 2016-11-09 | Discharge: 2016-11-09 | Disposition: A | Discharge status: Home / Self Care | Payer: Medicare Other | Source: Ambulatory Visit | Attending: Vascular Surgery | Admitting: Vascular Surgery

## 2016-11-09 ENCOUNTER — Other Ambulatory Visit: Payer: Self-pay

## 2016-11-09 ENCOUNTER — Ambulatory Visit (INDEPENDENT_AMBULATORY_CARE_PROVIDER_SITE_OTHER): Payer: Medicare Other | Admitting: Vascular Surgery

## 2016-11-09 VITALS — BP 140/80 | HR 58 | Temp 96.8°F | Resp 16 | Ht 60.0 in | Wt 125.0 lb

## 2016-11-09 DIAGNOSIS — I10 Essential (primary) hypertension: Secondary | ICD-10-CM | POA: Insufficient documentation

## 2016-11-09 DIAGNOSIS — E119 Type 2 diabetes mellitus without complications: Secondary | ICD-10-CM | POA: Diagnosis not present

## 2016-11-09 DIAGNOSIS — E785 Hyperlipidemia, unspecified: Secondary | ICD-10-CM | POA: Insufficient documentation

## 2016-11-09 DIAGNOSIS — I739 Peripheral vascular disease, unspecified: Secondary | ICD-10-CM

## 2016-11-09 DIAGNOSIS — M25579 Pain in unspecified ankle and joints of unspecified foot: Secondary | ICD-10-CM | POA: Diagnosis not present

## 2016-11-09 DIAGNOSIS — M419 Scoliosis, unspecified: Secondary | ICD-10-CM

## 2016-11-09 DIAGNOSIS — R0989 Other specified symptoms and signs involving the circulatory and respiratory systems: Secondary | ICD-10-CM | POA: Diagnosis present

## 2016-11-09 DIAGNOSIS — M5137 Other intervertebral disc degeneration, lumbosacral region: Secondary | ICD-10-CM

## 2016-11-09 MED ORDER — GABAPENTIN 100 MG PO CAPS: 100.0000 mg | ORAL_CAPSULE | Freq: Three times a day (TID) | ORAL | 2 refills | Status: DC

## 2016-11-09 NOTE — Progress Notes (Signed)
[facility] [place]   Patient name: [name]   MRN: [id number]        DOB: Jun 23, 1926          Sex: female   REASON FOR VISIT: Follow-up PAD   HPI: [name] is a 82 y.o. female who presents for follow-up today. She was last seen in the office on 05/2016. She has a history of left femoral popliteal bypass with revision several years ago. She also previously had a right femoral to above-knee popliteal bypass in the past. These both were done by Dr. Nils Pyle. She had a recent arterial duplex performed secondary to numbness and tingling in both of her feet. This revealed an occluded right femoral-popliteal bypass. She also complained of right calf claudication.  She continues to complain of numbness and tingling from the calf down to the foot in both legs. Both legs are equal. She states that usually this occurs in the evenings. She is able to walk as much as she currently wishes. This mainly involves walking around the house and some down the street. She does not describe claudication.  At her previous office visit she's had several ulcerations scattered, tips of her toes which have all completely healed spontaneously.  Other chronic medical problems include hypertension hyperlipidemia diabetes and some element of renal insufficiency. Her most recent serum creatinine March 2017 was 1.99. All of these are currently stable.   She is not a smoker.       Past Medical History  Diagnosis Date  . HTN (hypertension)    . Rotator cuff insufficiency 03/23/2011  . Arthritis    . Hyperlipidemia    . Diabetes mellitus without complication ([place])    . Depression    . Peripheral vascular disease ([place])    . Thyroid disease    . Hypothyroidism    . Cancer ([place])    . Femoral-femoral bypass graft thrombosis, left ([facility])              Family History  Problem Relation Age of Onset  . Stroke Mother    . Diabetes Mother        Leg amputation  . Hypertension Mother    .  Heart disease Father    . Heart attack Father    . Cancer Daughter        SOCIAL HISTORY:     Social History  Substance Use Topics  . Smoking status: Never Smoker   . Smokeless tobacco: Never Used  . Alcohol Use: No      No Known Allergies         Current Outpatient Prescriptions  Medication Sig Dispense Refill  . acetaminophen-codeine (TYLENOL #3) 300-30 MG tablet Take 1 tablet by mouth every 8 (eight) hours as needed for moderate pain.      Marland Kitchen amLODipine (NORVASC) 10 MG tablet Take 10 mg by mouth daily.      Marland Kitchen aspirin 81 MG tablet Take 81 mg by mouth daily. Reported on 02/21/2016      . Dextromethorphan-Quinidine (NUEDEXTA) 20-10 MG CAPS Take 1 capsule by mouth 2 (two) times daily.      . ferrous sulfate 325 (65 FE) MG tablet Take 325 mg by mouth daily with breakfast.      . gabapentin (NEURONTIN) 100 MG capsule Take 1 capsule (100 mg total) by mouth 3 (three) times daily. 90 capsule 5  . HYDROcodone-acetaminophen (NORCO) 7.5-325 MG tablet Take 1 tablet by mouth every  6 (six) hours as needed. 120 tablet 0  . levothyroxine (SYNTHROID, LEVOTHROID) 75 MCG tablet Take 75 mcg by mouth daily.        . Multiple Vitamin (MULTIVITAMIN WITH MINERALS) TABS Take 1 tablet by mouth daily.        No current facility-administered medications for this visit.      REVIEW OF SYSTEMS:  [X]  denotes positive finding, [ ]  denotes negative finding Cardiac   Comments:  Chest pain or chest pressure:      Shortness of breath upon exertion:      Short of breath when lying flat:      Irregular heart rhythm:             Vascular      Pain in calf, thigh, or hip brought on by ambulation:     Pain in feet at night that wakes you up from your sleep:  x    Blood clot in your veins:      Leg swelling:              Pulmonary      Oxygen at home:      Productive cough:       Wheezing:              Neurologic      Sudden weakness in arms or legs:       Sudden numbness in arms or legs:       Sudden  onset of difficulty speaking or slurred speech:      Temporary loss of vision in one eye:       Problems with dizziness:              Gastrointestinal      Blood in stool:       Vomited blood:              Genitourinary      Burning when urinating:       Blood in urine:             Psychiatric      Major depression:              Hematologic      Bleeding problems:      Problems with blood clotting too easily:             Skin      Rashes or ulcers:             Constitutional      Fever or chills:          PHYSICAL EXAM:  Vitals:   11/09/16 1042  BP: 140/80  Pulse: (!) 58  Resp: 16  Temp: (!) 96.8 F (36 C)  TempSrc: Oral  SpO2: 99%  Weight: 125 lb (56.7 kg)  Height: 5' (1.524 m)     GENERAL: The patient is a well-nourished female, in no acute distress. The vital signs are documented above. CARDIAC: There is a regular rate and rhythm.  VASCULAR: Nonpalpable pedal pulses bilaterally. Feet are warm and pink bilaterally.  PULMONARY: There is good air exchange bilaterally without wheezing or rales. MUSCULOSKELETAL: There are no major deformities or cyanosis. NEUROLOGIC: No focal weakness or paresthesias are detected. SKIN: There are no ulcers or rashes noted. PSYCHIATRIC: The patient has a normal affect.   DATA:  ABIs 04/28/2016   Right: 0.52 with monophasic posterior tibial and dorsalis pedis waveforms. TBI: 0.56. Left: 0.95 with biphasic posterior tibial and  dorsalis pedis waveforms. Left TBI: 1.0.  ABIs today were 0.45 on the right 0.76 on the left   MEDICAL ISSUES: Known occluded right femoral-popliteal bypass with no real symptoms currently Left femoral-popliteal bypass 2010   Continue conservative therapy. Encouraged her to continue 30 minutes of walking daily to increase collateralization.     If her symptoms worsen, we'll need to consider arteriography. She would be at high risk for contrast nephropathy given her elevated creatinine.   I would not  consider revascularization in her for Claudication symptoms alone.   Most of her symptoms seem to be primarily neuropathic in nature. We renewed her Neurontin prescription today.   She will follow-up with her nurse practitioner with repeat ABIs in 6 months.   Ruta Hinds, MD [facility] [place] Office: [phone] Pager: [phone]

## 2016-11-10 NOTE — Addendum Note (Signed)
Addended by: Lianne Cure A on: 11/10/2016 08:44 AM   Modules accepted: Orders

## 2016-11-22 ENCOUNTER — Encounter: Payer: Self-pay | Admitting: Orthopedic Surgery

## 2016-11-22 ENCOUNTER — Ambulatory Visit (INDEPENDENT_AMBULATORY_CARE_PROVIDER_SITE_OTHER): Payer: Medicare Other | Admitting: Orthopedic Surgery

## 2016-11-22 DIAGNOSIS — G894 Chronic pain syndrome: Secondary | ICD-10-CM | POA: Diagnosis not present

## 2016-11-22 NOTE — Progress Notes (Signed)
Patient ID: [name], female   DOB: 1926-06-14, 82 y.o.   MRN: [id number]  Follow up visit/    Chief Complaint  Patient presents with  . Follow-up    CHRONIC PAIN    Routine follow-up for chronic pain. Medications are working somewhat she's having some soreness in both shoulders   Encounter Diagnosis  Name Primary?  . Chronic pain syndrome Yes    Routine follow-up chronic pain management  Follow-up 3 months  Starting 2018 appropriate blood tests or urine test will be done as well.  10:57 AM Arther Abbott, MD 11/22/2016

## 2016-12-01 ENCOUNTER — Telehealth: Payer: Self-pay | Admitting: Orthopedic Surgery

## 2016-12-01 DIAGNOSIS — M5137 Other intervertebral disc degeneration, lumbosacral region: Secondary | ICD-10-CM

## 2016-12-01 NOTE — Telephone Encounter (Signed)
Patient requests refill on Hydrocodone/Acetaminophen  7.5-325 mgs.       Sig: Take 1 tablet by mouth every 4 (four) hours as needed.

## 2016-12-01 NOTE — Telephone Encounter (Signed)
ROUTING TO DR HARRISON FOR APPROVAL 

## 2016-12-05 MED ORDER — HYDROCODONE-ACETAMINOPHEN 7.5-325 MG PO TABS: 1.0000 | ORAL_TABLET | ORAL | 0 refills | Status: DC | PRN

## 2016-12-11 ENCOUNTER — Other Ambulatory Visit: Payer: Self-pay | Admitting: Orthopedic Surgery

## 2016-12-11 DIAGNOSIS — M5137 Other intervertebral disc degeneration, lumbosacral region: Secondary | ICD-10-CM

## 2016-12-11 MED ORDER — HYDROCODONE-ACETAMINOPHEN 7.5-325 MG PO TABS: 1.0000 | ORAL_TABLET | ORAL | 0 refills | Status: DC | PRN

## 2016-12-11 NOTE — Progress Notes (Signed)
[place] controlled substance reporting system reviewed  

## 2016-12-12 DIAGNOSIS — Z933 Colostomy status: Secondary | ICD-10-CM | POA: Diagnosis not present

## 2016-12-26 DIAGNOSIS — I10 Essential (primary) hypertension: Secondary | ICD-10-CM | POA: Diagnosis not present

## 2016-12-26 DIAGNOSIS — N189 Chronic kidney disease, unspecified: Secondary | ICD-10-CM | POA: Diagnosis not present

## 2016-12-26 DIAGNOSIS — I129 Hypertensive chronic kidney disease with stage 1 through stage 4 chronic kidney disease, or unspecified chronic kidney disease: Secondary | ICD-10-CM | POA: Diagnosis not present

## 2016-12-26 DIAGNOSIS — N185 Chronic kidney disease, stage 5: Secondary | ICD-10-CM | POA: Diagnosis not present

## 2017-01-01 ENCOUNTER — Encounter: Payer: Self-pay | Admitting: Family

## 2017-01-04 ENCOUNTER — Ambulatory Visit: Payer: Medicare Other | Admitting: Family

## 2017-01-04 ENCOUNTER — Ambulatory Visit (HOSPITAL_COMMUNITY): Payer: Medicare Other

## 2017-01-11 DIAGNOSIS — I10 Essential (primary) hypertension: Secondary | ICD-10-CM | POA: Diagnosis not present

## 2017-01-11 DIAGNOSIS — N189 Chronic kidney disease, unspecified: Secondary | ICD-10-CM | POA: Diagnosis not present

## 2017-01-11 DIAGNOSIS — E1140 Type 2 diabetes mellitus with diabetic neuropathy, unspecified: Secondary | ICD-10-CM | POA: Diagnosis not present

## 2017-01-26 ENCOUNTER — Telehealth: Payer: Self-pay | Admitting: Orthopedic Surgery

## 2017-01-26 ENCOUNTER — Other Ambulatory Visit: Payer: Self-pay | Admitting: Orthopedic Surgery

## 2017-01-26 DIAGNOSIS — M5137 Other intervertebral disc degeneration, lumbosacral region: Secondary | ICD-10-CM

## 2017-01-26 MED ORDER — HYDROCODONE-ACETAMINOPHEN 7.5-325 MG PO TABS: 1.0000 | ORAL_TABLET | ORAL | 0 refills | Status: DC | PRN

## 2017-01-26 NOTE — Progress Notes (Unsigned)
[place] controlled substance reporting system reviewed  

## 2017-01-26 NOTE — Telephone Encounter (Signed)
Hydrocodone-Acetaminophen  7.5/325mg   Qty 90 Tablets  Take 1 tablet by mouth every 4 (four) hours as needed.

## 2017-01-26 NOTE — Telephone Encounter (Signed)
ROUTING TO DR HARRISON FOR APPROVAL 

## 2017-02-20 ENCOUNTER — Encounter: Payer: Self-pay | Admitting: Orthopedic Surgery

## 2017-02-20 ENCOUNTER — Ambulatory Visit (INDEPENDENT_AMBULATORY_CARE_PROVIDER_SITE_OTHER): Payer: Medicare Other | Admitting: Orthopedic Surgery

## 2017-02-20 VITALS — BP 133/67 | HR 91 | Ht 60.0 in | Wt 129.0 lb

## 2017-02-20 DIAGNOSIS — G894 Chronic pain syndrome: Secondary | ICD-10-CM | POA: Diagnosis not present

## 2017-02-20 DIAGNOSIS — M5137 Other intervertebral disc degeneration, lumbosacral region: Secondary | ICD-10-CM

## 2017-02-20 MED ORDER — HYDROCODONE-ACETAMINOPHEN 7.5-325 MG PO TABS: 1.0000 | ORAL_TABLET | ORAL | 0 refills | Status: DC | PRN

## 2017-02-20 NOTE — Progress Notes (Signed)
FOLLOW UP VISIT   Patient ID: ENIJAH FURR, female   DOB: 1926-11-12, 83 y.o.   MRN: [id number]  Chief Complaint  Patient presents with  . Follow-up    CHRONIC PAIN    HPI CEARA WRIGHTSON is a 83 y.o. female.   HPI  83 year old female with chronic pain nonoperable shoulder and back problems presents for routine follow-up.  Review of Systems Review of Systems   No drug related complications or nausea or vomiting  Physical Exam     MEDICAL DECISION MAKING  DATA   Procedure note the subacromial injection shoulder RIGHT  Verbal consent was obtained to inject the  RIGHT   Shoulder  Timeout was completed to confirm the injection site is a subacromial space of the  RIGHT  shoulder   Medication used Depo-Medrol 40 mg and lidocaine 1% 3 cc  Anesthesia was provided by ethyl chloride  The injection was performed in the RIGHT  posterior subacromial space. After pinning the skin with alcohol and anesthetized the skin with ethyl chloride the subacromial space was injected using a 20-gauge needle. There were no complications  Sterile dressing was applied.  Procedure note the subacromial injection shoulder left   Verbal consent was obtained to inject the  Left   Shoulder  Timeout was completed to confirm the injection site is a subacromial space of the  left  shoulder  Medication used Depo-Medrol 40 mg and lidocaine 1% 3 cc  Anesthesia was provided by ethyl chloride  The injection was performed in the left  posterior subacromial space. After pinning the skin with alcohol and anesthetized the skin with ethyl chloride the subacromial space was injected using a 20-gauge needle. There were no complications  Sterile dressing was applied.           Encounter Diagnoses  Name Primary?  . DDD (degenerative disc disease), lumbosacral   . Chronic pain syndrome Yes      PLAN(RISK)    3 months follow-up  Meds ordered this encounter  Medications  . diclofenac  sodium (VOLTAREN) 1 % GEL    Sig: Apply topically 4 (four) times daily.  Marland Kitchen HYDROcodone-acetaminophen (NORCO) 7.5-325 MG tablet    Sig: Take 1 tablet by mouth every 4 (four) hours as needed.    Dispense:  90 tablet    Refill:  0

## 2017-02-28 DIAGNOSIS — Z933 Colostomy status: Secondary | ICD-10-CM | POA: Diagnosis not present

## 2017-03-12 DIAGNOSIS — I10 Essential (primary) hypertension: Secondary | ICD-10-CM | POA: Diagnosis not present

## 2017-03-12 DIAGNOSIS — E039 Hypothyroidism, unspecified: Secondary | ICD-10-CM | POA: Diagnosis not present

## 2017-03-12 DIAGNOSIS — E1122 Type 2 diabetes mellitus with diabetic chronic kidney disease: Secondary | ICD-10-CM | POA: Diagnosis not present

## 2017-04-10 ENCOUNTER — Other Ambulatory Visit: Payer: Self-pay | Admitting: *Deleted

## 2017-04-10 DIAGNOSIS — M419 Scoliosis, unspecified: Secondary | ICD-10-CM

## 2017-04-10 DIAGNOSIS — M5137 Other intervertebral disc degeneration, lumbosacral region: Secondary | ICD-10-CM

## 2017-04-10 MED ORDER — GABAPENTIN 100 MG PO CAPS: 100.0000 mg | ORAL_CAPSULE | Freq: Three times a day (TID) | ORAL | 2 refills | Status: DC

## 2017-05-01 ENCOUNTER — Other Ambulatory Visit: Payer: Self-pay | Admitting: Orthopedic Surgery

## 2017-05-01 DIAGNOSIS — M5137 Other intervertebral disc degeneration, lumbosacral region: Secondary | ICD-10-CM

## 2017-05-01 NOTE — Telephone Encounter (Signed)
Patient requests refill on Hydrocodone/Acetaminophen 7.5-325  Mgs.    Qty  90   Sig: Take 1 tablet by mouth every 4 (four) hours as needed.

## 2017-05-04 DIAGNOSIS — Z933 Colostomy status: Secondary | ICD-10-CM | POA: Diagnosis not present

## 2017-05-10 ENCOUNTER — Ambulatory Visit: Payer: Medicare Other | Admitting: Family

## 2017-05-10 ENCOUNTER — Encounter (HOSPITAL_COMMUNITY): Payer: Medicare Other

## 2017-05-17 ENCOUNTER — Telehealth: Payer: Self-pay | Admitting: Orthopedic Surgery

## 2017-05-17 NOTE — Telephone Encounter (Signed)
Wants appt for left knee/NEW PROBLEM.  I called her back but did not get an answer.

## 2017-05-28 ENCOUNTER — Ambulatory Visit: Payer: Medicare Other | Admitting: Orthopedic Surgery

## 2017-06-07 ENCOUNTER — Telehealth: Payer: Self-pay | Admitting: Orthopedic Surgery

## 2017-06-07 NOTE — Telephone Encounter (Signed)
Routing to Dr Harrison for approval 

## 2017-06-07 NOTE — Telephone Encounter (Signed)
Patient requests refill on Hydrocodone/Acetaminophen 7.5-325  Mgs.   Qty  90   Sig: TAKE ONE TABLET BY MOUTH EVERY 4 HOURS AS NEEDED.

## 2017-06-08 ENCOUNTER — Other Ambulatory Visit: Payer: Self-pay | Admitting: Orthopedic Surgery

## 2017-06-08 DIAGNOSIS — M5137 Other intervertebral disc degeneration, lumbosacral region: Secondary | ICD-10-CM

## 2017-06-08 MED ORDER — HYDROCODONE-ACETAMINOPHEN 7.5-325 MG PO TABS: 1.0000 | ORAL_TABLET | ORAL | 0 refills | Status: DC | PRN

## 2017-06-26 ENCOUNTER — Ambulatory Visit (INDEPENDENT_AMBULATORY_CARE_PROVIDER_SITE_OTHER): Payer: Medicare Other | Admitting: Orthopedic Surgery

## 2017-06-26 DIAGNOSIS — G894 Chronic pain syndrome: Secondary | ICD-10-CM | POA: Diagnosis not present

## 2017-06-26 NOTE — Progress Notes (Signed)
83 year old female with chronic pain  Bilateral chronic rotator cuff insufficiency nonsurgical candidate  Also has cervical spine disease  Presents for 3 month routine chronic pain follow-up  She is doing well with hydrocodone 7.5 mg 2-3 per day  She has exhibited no abuse of medications.  She will continue with the same medications and see [place] in 3 months  Encounter Diagnosis  Name Primary?  . Chronic pain syndrome Yes

## 2017-06-28 ENCOUNTER — Other Ambulatory Visit: Payer: Self-pay | Admitting: *Deleted

## 2017-06-28 ENCOUNTER — Ambulatory Visit: Payer: Medicare Other | Admitting: Family

## 2017-06-28 ENCOUNTER — Encounter (HOSPITAL_COMMUNITY): Payer: Medicare Other

## 2017-06-28 DIAGNOSIS — M5137 Other intervertebral disc degeneration, lumbosacral region: Secondary | ICD-10-CM

## 2017-06-28 DIAGNOSIS — M419 Scoliosis, unspecified: Secondary | ICD-10-CM

## 2017-06-28 MED ORDER — GABAPENTIN 100 MG PO CAPS: 100.0000 mg | ORAL_CAPSULE | Freq: Three times a day (TID) | ORAL | 2 refills | Status: DC

## 2017-07-04 DIAGNOSIS — Z933 Colostomy status: Secondary | ICD-10-CM | POA: Diagnosis not present

## 2017-07-05 DIAGNOSIS — Z933 Colostomy status: Secondary | ICD-10-CM | POA: Diagnosis not present

## 2017-07-06 DIAGNOSIS — Z933 Colostomy status: Secondary | ICD-10-CM | POA: Diagnosis not present

## 2017-07-16 ENCOUNTER — Telehealth: Payer: Self-pay | Admitting: Orthopedic Surgery

## 2017-07-16 NOTE — Telephone Encounter (Signed)
Patient came by office for refill: HYDROcodone-acetaminophen ([place]) 7.5-325 MG tablet  -

## 2017-07-17 ENCOUNTER — Other Ambulatory Visit: Payer: Self-pay | Admitting: Orthopedic Surgery

## 2017-07-17 DIAGNOSIS — M5137 Other intervertebral disc degeneration, lumbosacral region: Secondary | ICD-10-CM

## 2017-07-17 MED ORDER — HYDROCODONE-ACETAMINOPHEN 7.5-325 MG PO TABS: 1.0000 | ORAL_TABLET | ORAL | 0 refills | Status: DC | PRN

## 2017-07-17 NOTE — Telephone Encounter (Signed)
Routing to dr Aline Brochure

## 2017-07-31 ENCOUNTER — Telehealth: Payer: Self-pay | Admitting: Orthopedic Surgery

## 2017-07-31 DIAGNOSIS — M5137 Other intervertebral disc degeneration, lumbosacral region: Secondary | ICD-10-CM

## 2017-07-31 NOTE — Telephone Encounter (Signed)
CAN YOU SEND THIS TO DR HARRISON THE WAY THEY GET SENT TO DR Luna Glasgow?

## 2017-07-31 NOTE — Telephone Encounter (Signed)
Patient requests refill on Hydrocodone/Acetaminophen 7.5-325 mgs.  Qty  90        Sig: Take 1 tablet by mouth every 4 (four) hours as needed.

## 2017-07-31 NOTE — Telephone Encounter (Signed)
[name] can you route this as a rx request

## 2017-08-01 MED ORDER — HYDROCODONE-ACETAMINOPHEN 7.5-325 MG PO TABS: 1.0000 | ORAL_TABLET | ORAL | 0 refills | Status: DC | PRN

## 2017-08-01 NOTE — Telephone Encounter (Signed)
HYDROcodone-acetaminophen (NORCO) 7.5-325 MG tablet 90 tablet   - Patient requests refill on this medication

## 2017-08-29 ENCOUNTER — Other Ambulatory Visit: Payer: Self-pay | Admitting: Orthopedic Surgery

## 2017-08-29 ENCOUNTER — Telehealth: Payer: Self-pay | Admitting: Orthopedic Surgery

## 2017-08-29 DIAGNOSIS — M5137 Other intervertebral disc degeneration, lumbosacral region: Secondary | ICD-10-CM

## 2017-08-29 MED ORDER — HYDROCODONE-ACETAMINOPHEN 7.5-325 MG PO TABS: 1.0000 | ORAL_TABLET | ORAL | 0 refills | Status: DC | PRN

## 2017-08-29 NOTE — Telephone Encounter (Signed)
Patient requests refill on Hydrocodone/Acetaminophen 7.5-325 mgs.   Qty  90       Sig: Take 1 tablet by mouth every 4 (four) hours as needed.

## 2017-08-30 ENCOUNTER — Encounter (HOSPITAL_COMMUNITY): Payer: Medicare Other

## 2017-08-30 ENCOUNTER — Ambulatory Visit: Payer: Medicare Other | Admitting: Family

## 2017-09-03 ENCOUNTER — Other Ambulatory Visit: Payer: Self-pay | Admitting: Vascular Surgery

## 2017-09-03 DIAGNOSIS — M5137 Other intervertebral disc degeneration, lumbosacral region: Secondary | ICD-10-CM

## 2017-09-03 DIAGNOSIS — M419 Scoliosis, unspecified: Secondary | ICD-10-CM

## 2017-09-03 MED ORDER — GABAPENTIN 100 MG PO CAPS: 100.0000 mg | ORAL_CAPSULE | Freq: Three times a day (TID) | ORAL | 2 refills | Status: DC

## 2017-09-25 ENCOUNTER — Ambulatory Visit: Payer: Medicare Other | Admitting: Orthopedic Surgery

## 2017-09-26 ENCOUNTER — Ambulatory Visit (INDEPENDENT_AMBULATORY_CARE_PROVIDER_SITE_OTHER): Payer: Medicare Other | Admitting: Orthopedic Surgery

## 2017-09-26 VITALS — BP 100/70 | HR 111 | Ht 60.0 in | Wt 131.0 lb

## 2017-09-26 DIAGNOSIS — M5137 Other intervertebral disc degeneration, lumbosacral region: Secondary | ICD-10-CM | POA: Diagnosis not present

## 2017-09-26 DIAGNOSIS — G894 Chronic pain syndrome: Secondary | ICD-10-CM

## 2017-09-26 MED ORDER — HYDROCODONE-ACETAMINOPHEN 7.5-325 MG PO TABS: 1.0000 | ORAL_TABLET | ORAL | 0 refills | Status: DC | PRN

## 2017-09-26 NOTE — Progress Notes (Signed)
Follow up   Chief Complaint  Patient presents with  . Follow-up    Recheck for chronic pain.   Overdose risk score is 050  Pain medication not causing any problems form a side effect standpoint  She is adamant about no surgery   The medication is working well    Meds ordered this encounter  Medications  . HYDROcodone-acetaminophen (NORCO) 7.5-325 MG tablet    Sig: Take 1 tablet by mouth every 4 (four) hours as needed.    Dispense:  90 tablet    Refill:  0    Encounter Diagnoses  Name Primary?  . Chronic pain syndrome Yes  . DDD (degenerative disc disease), lumbosacral     Fu 3 months

## 2017-10-04 DIAGNOSIS — Z23 Encounter for immunization: Secondary | ICD-10-CM | POA: Diagnosis not present

## 2017-10-05 DIAGNOSIS — Z933 Colostomy status: Secondary | ICD-10-CM | POA: Diagnosis not present

## 2017-10-08 ENCOUNTER — Telehealth: Payer: Self-pay | Admitting: Orthopedic Surgery

## 2017-10-08 NOTE — Telephone Encounter (Signed)
Patient's designated contact, niece, Blair Heys, PH# [phone], called to request Dr Aline Brochure to please call her directly, regarding patient Ms. [name]. No other information provided.

## 2017-10-18 ENCOUNTER — Encounter (HOSPITAL_COMMUNITY): Payer: Medicare Other

## 2017-10-18 ENCOUNTER — Ambulatory Visit: Payer: Medicare Other | Admitting: Family

## 2017-11-21 ENCOUNTER — Other Ambulatory Visit: Payer: Self-pay | Admitting: Orthopedic Surgery

## 2017-11-21 DIAGNOSIS — M5137 Other intervertebral disc degeneration, lumbosacral region: Secondary | ICD-10-CM

## 2017-11-21 MED ORDER — HYDROCODONE-ACETAMINOPHEN 7.5-325 MG PO TABS: 1.0000 | ORAL_TABLET | ORAL | 0 refills | Status: DC | PRN

## 2017-11-21 NOTE — Telephone Encounter (Signed)
[place]

## 2017-11-22 ENCOUNTER — Ambulatory Visit: Payer: Medicare Other | Admitting: Family

## 2017-11-22 ENCOUNTER — Ambulatory Visit (HOSPITAL_COMMUNITY)
Admission: RE | Admit: 2017-11-22 | Discharge: 2017-11-22 | Disposition: A | Discharge status: Home / Self Care | Payer: Medicare Other | Source: Ambulatory Visit | Attending: Vascular Surgery | Admitting: Vascular Surgery

## 2017-11-22 DIAGNOSIS — I739 Peripheral vascular disease, unspecified: Secondary | ICD-10-CM

## 2017-12-12 DIAGNOSIS — Z933 Colostomy status: Secondary | ICD-10-CM | POA: Diagnosis not present

## 2017-12-17 ENCOUNTER — Ambulatory Visit (HOSPITAL_COMMUNITY)
Admission: RE | Admit: 2017-12-17 | Discharge: 2017-12-17 | Disposition: A | Discharge status: Home / Self Care | Payer: Medicare Other | Source: Ambulatory Visit | Attending: Vascular Surgery | Admitting: Vascular Surgery

## 2017-12-17 ENCOUNTER — Encounter: Payer: Self-pay | Admitting: Family

## 2017-12-17 ENCOUNTER — Ambulatory Visit (INDEPENDENT_AMBULATORY_CARE_PROVIDER_SITE_OTHER): Payer: Medicare Other | Admitting: Family

## 2017-12-17 VITALS — BP 129/60 | HR 51 | Temp 97.0°F | Resp 16 | Ht 60.0 in | Wt 131.0 lb

## 2017-12-17 DIAGNOSIS — I779 Disorder of arteries and arterioles, unspecified: Secondary | ICD-10-CM

## 2017-12-17 DIAGNOSIS — Z95828 Presence of other vascular implants and grafts: Secondary | ICD-10-CM

## 2017-12-17 DIAGNOSIS — I739 Peripheral vascular disease, unspecified: Secondary | ICD-10-CM | POA: Diagnosis present

## 2017-12-17 NOTE — Patient Instructions (Signed)

## 2017-12-17 NOTE — Progress Notes (Signed)
[facility]   CC: Follow up peripheral artery occlusive disease  History of Present Illness [name] is a 84 y.o. female who presents for follow-up today. She has a history of left femoral popliteal bypass in February 2010 with revision in May 2010. She also previously had a right femoral to above-knee popliteal bypass in 2005. These both were done by Dr. Nils Pyle. She had a more recent arterial duplex performed secondary to numbness and tingling in both of her feet. This revealed an occluded right femoral-popliteal bypass. She also complained of right calf claudication at an earlier visit, but no claudication symptoms recently.  She is able to walk as much as she wishes. This mainly involves walking around the house and some down the street. She does not describe claudication.  At her previous office visit she's had several ulcerations at tips of her toes which have all completely healed spontaneously.  Dr. Oneida Alar last evaluated pt on 11-09-16. At that time she had a known occluded right femoral-popliteal bypass with no real symptoms. Continue conservative therapy. Encouraged her to continue 30 minutes of walking daily to increase collateralization.  If her symptoms worsen, we'll need to consider arteriography. She would be at high risk for contrast nephropathy given her elevated creatinine. Dr. Oneida Alar would not consider revascularization in her for claudication symptoms alone.  Most of her symptoms seem to be primarily neuropathic in nature. Dr. Oneida Alar renewed her Neurontin prescription that day.  She was to follow-up with [facility] nurse practitioner with repeat ABIs in 6 months.  Most recent serum creatinine and eGFR results on file were 1.99 and 25 on 02-18-16, stage 4 CKD.   Dr. Arther Abbott sees pt for chronic pain management for lumbosacral DDD, he prescribed norco 7-325, 1 tab every 4 hours prn pain.  She has a colostomy, states her colon surgery  was long ago, as an adult, does not know why she had colon surgery but states she did not have cancer.   Pt states she has no pain or weakness in her legs when she walks, but both legs hurt when she is supine.   She states she has no falling issues.     Diabetic: No Tobacco use: non-smoker  Pt meds include: Statin :No Betablocker: No ASA: no, pt states she is not taking but is on her medication list; I advised her to take daily.  Other anticoagulants/antiplatelets: no  Past Medical History:  Diagnosis Date  . Arthritis   . Cancer ([place])   . Depression   . Diabetes mellitus without complication ([place])   . Femoral-femoral bypass graft thrombosis, left ([place])   . HTN (hypertension)   . Hyperlipidemia   . Hypothyroidism   . Peripheral vascular disease ([place])   . Rotator cuff insufficiency 03/23/2011  . Thyroid disease     Social History Social History   Tobacco Use  . Smoking status: Never Smoker  . Smokeless tobacco: Never Used  Substance Use Topics  . Alcohol use: No    Alcohol/week: 0.0 oz  . Drug use: No    Family History Family History  Problem Relation Age of Onset  . Stroke Mother   . Diabetes Mother        Leg amputation  . Hypertension Mother   . Heart disease Father   . Heart attack Father   . Cancer Daughter     Past Surgical History:  Procedure Laterality Date  . ABDOMINAL HYSTERECTOMY    . APPENDECTOMY    .  CARPAL TUNNEL RELEASE     right side  . CATARACT EXTRACTION W/PHACO Right 06/05/2013   Procedure: CATARACT EXTRACTION PHACO AND INTRAOCULAR LENS PLACEMENT (IOC);  Surgeon: Tonny Branch, MD;  Location: [facility];  Service: Ophthalmology;  Laterality: Right;  CDE: 10.58  . CATARACT EXTRACTION W/PHACO Left 07/10/2013   Procedure: CATARACT EXTRACTION PHACO AND INTRAOCULAR LENS PLACEMENT (IOC);  Surgeon: Tonny Branch, MD;  Location: [facility];  Service: Ophthalmology;  Laterality: Left;  CDE: 17.35  . CHOLECYSTECTOMY    . COLON SURGERY     sigmoid colectomy  complicated by anastomotic leak  . COLOSTOMY  transverse loop, 2006  . EYE SURGERY    . grafts of bilateral lower extremities    . left shoulder hemiarthroplasty    . TONSILLECTOMY    . VESICOVAGINAL FISTULA CLOSURE W/ TAH      No Known Allergies  Current Outpatient Medications  Medication Sig Dispense Refill  . amLODipine (NORVASC) 10 MG tablet Take 10 mg by mouth daily.    Marland Kitchen aspirin 81 MG tablet Take 81 mg by mouth daily. Reported on 02/21/2016    . Dextromethorphan-Quinidine (NUEDEXTA) 20-10 MG CAPS Take 1 capsule by mouth 2 (two) times daily.    . diclofenac sodium (VOLTAREN) 1 % GEL Apply topically 4 (four) times daily.    . ferrous sulfate 325 (65 FE) MG tablet Take 325 mg by mouth daily with breakfast.    . gabapentin (NEURONTIN) 100 MG capsule Take 1 capsule (100 mg total) by mouth 3 (three) times daily. 90 capsule 2  . HYDROcodone-acetaminophen (NORCO) 7.5-325 MG tablet Take 1 tablet by mouth every 4 (four) hours as needed. 90 tablet 0  . ibuprofen (ADVIL,MOTRIN) 400 MG tablet Take 1 tablet (400 mg total) by mouth every 6 (six) hours as needed. 90 tablet 5  . levothyroxine (SYNTHROID, LEVOTHROID) 75 MCG tablet Take 75 mcg by mouth daily.      . Multiple Vitamin (MULTIVITAMIN WITH MINERALS) TABS Take 1 tablet by mouth daily.     No current facility-administered medications for this visit.     ROS: See HPI for pertinent positives and negatives.   Physical Examination  Vitals:   12/17/17 1446  BP: 129/60  Pulse: (!) 51  Resp: 16  Temp: (!) 97 F (36.1 C)  TempSrc: Oral  SpO2: 98%  Weight: 131 lb (59.4 kg)  Height: 5' (1.524 m)   Body mass index is 25.58 kg/m.  General: A&O x 3, WDWN, elderly female. Gait: normal HENT: No gross abnormalities  Eyes: PERRLA. Pulmonary: Respirations are non labored, CTAB, good air movement in all fields. Cardiac: regular rhythm, bradycardic (not on a beta blocker), no detected murmur.         Carotid Bruits Right Left    Negative Negative   Radial pulses are 1+ palpable bilaterally   Adominal aortic pulse is not palpable                         VASCULAR EXAM: Extremities without ischemic changes, without Gangrene; without open wounds.  LE Pulses Right Left       FEMORAL   palpable   palpable        POPLITEAL  not palpable   not palpable       POSTERIOR TIBIAL  not palpable   not palpable        DORSALIS PEDIS      ANTERIOR TIBIAL not palpable  not palpable    Abdomen: soft, NT, no masses. Colostomy with appliance RUQ.  Skin: no rashes, no ulcers noted. Musculoskeletal: no muscle wasting or atrophy.  Neurologic: A&O X 3; Appropriate Affect ; SENSATION: normal; MOTOR FUNCTION:  moving all extremities equally, motor strength 5/5 throughout. Speech is fluent/normal. CN 2-12 intact. Psychiatric: Normal thought content, mood appropriate for clinical situation    ASSESSMENT: [name] is a 84 y.o. female who is s/p left femoral popliteal bypass in February 2010 with revision in May 2010. She also previously had a right femoral to above-knee popliteal bypass in 2005. These both were done by Dr. Nils Pyle. She had a more recent arterial duplex performed secondary to numbness and tingling in both of her feet. This revealed an occluded right femoral-popliteal bypass.  She has not claudication sx's with walking and can walk as much as she wishes.  There are no signs of ischemia in her feet or legs.   She seems to stay active.  Fortunately she has never used tobacco and does not have DM.   DATA  ABI (Date: 12/17/2017):  R:   ABI: 0.56 (was 0.45 on 11-09-16),   PT: mono  DP: mono  TBI:  0.30 (was 0.31)  L:   ABI: 0.93 (was 0.76),   PT: mono  DP: mono  TBI: 0.43 (was 0.52)  Unable to obtain reliable ABI's due to lack of correlation to waveforms. All monophasic waveforms. Stable  right TBI, decline in left TBI.    PLAN:  I advised pt to walk a total of 30 minutes daily or more. Daily seated leg exercises also discussed.  Based on the patient's vascular studies and examination, pt will return to clinic in 9 months with ABI's.  I advised her to notify us if she develops concerns re the circulation in her feet or legs.   I discussed in depth with the patient the nature of atherosclerosis, and emphasized the importance of maximal medical management including strict control of blood pressure, blood glucose, and lipid levels, obtaining regular exercise, and continued cessation of smoking.  The patient is aware that without maximal medical management the underlying atherosclerotic disease process will progress, limiting the benefit of any interventions.  The patient was given information about PAD including signs, symptoms, treatment, what symptoms should prompt the patient to seek immediate medical care, and risk reduction measures to take.  Clemon Chambers, RN, MSN, FNP-C [facility] Phone: [phone]  Clinic MD: Trula Slade  12/17/17 3:18 PM

## 2017-12-26 ENCOUNTER — Ambulatory Visit (INDEPENDENT_AMBULATORY_CARE_PROVIDER_SITE_OTHER): Payer: Medicare Other | Admitting: Orthopedic Surgery

## 2017-12-26 ENCOUNTER — Encounter: Payer: Self-pay | Admitting: Orthopedic Surgery

## 2017-12-26 VITALS — BP 136/78 | HR 93 | Ht 60.0 in | Wt 130.0 lb

## 2017-12-26 DIAGNOSIS — G894 Chronic pain syndrome: Secondary | ICD-10-CM

## 2017-12-26 DIAGNOSIS — M19019 Primary osteoarthritis, unspecified shoulder: Secondary | ICD-10-CM

## 2017-12-26 NOTE — Patient Instructions (Signed)

## 2017-12-26 NOTE — Progress Notes (Signed)
Chief Complaint  Patient presents with  . Shoulder Pain    bilateral   . Injections    wants bilateral shoulder injections    Procedure note the subacromial injection shoulder left   Verbal consent was obtained to inject the  Left   Shoulder  Timeout was completed to confirm the injection site is a subacromial space of the  left  shoulder  Medication used Depo-Medrol 40 mg and lidocaine 1% 3 cc  Anesthesia was provided by ethyl chloride  The injection was performed in the left  posterior subacromial space. After pinning the skin with alcohol and anesthetized the skin with ethyl chloride the subacromial space was injected using a 20-gauge needle. There were no complications  Sterile dressing was applied.     Encounter Diagnoses  Name Primary?  . Chronic pain syndrome Yes  . Shoulder arthritis        Procedure note the subacromial injection shoulder RIGHT  Verbal consent was obtained to inject the  RIGHT   Shoulder  Timeout was completed to confirm the injection site is a subacromial space of the  RIGHT  shoulder   Medication used Depo-Medrol 40 mg and lidocaine 1% 3 cc  Anesthesia was provided by ethyl chloride  The injection was performed in the RIGHT  posterior subacromial space. After pinning the skin with alcohol and anesthetized the skin with ethyl chloride the subacromial space was injected using a 20-gauge needle. There were no complications  Sterile dressing was applied.

## 2017-12-27 ENCOUNTER — Telehealth: Payer: Self-pay | Admitting: Orthopedic Surgery

## 2017-12-27 NOTE — Telephone Encounter (Signed)
Patient called, was advised at time of check-out at appointment yesterday, 12/26/17, that her prescription would be at the [facility].  States pharmacy does not have a prescription. (states it is her pain medication):   HYDROcodone-acetaminophen (NORCO) 7.5-325 MG tablet 90 tablet

## 2018-01-15 DIAGNOSIS — Z933 Colostomy status: Secondary | ICD-10-CM | POA: Diagnosis not present

## 2018-01-23 DIAGNOSIS — N183 Chronic kidney disease, stage 3 (moderate): Secondary | ICD-10-CM | POA: Diagnosis not present

## 2018-02-18 DIAGNOSIS — Z0000 Encounter for general adult medical examination without abnormal findings: Secondary | ICD-10-CM | POA: Diagnosis not present

## 2018-03-07 ENCOUNTER — Other Ambulatory Visit: Payer: Self-pay | Admitting: *Deleted

## 2018-03-07 DIAGNOSIS — M5137 Other intervertebral disc degeneration, lumbosacral region: Secondary | ICD-10-CM

## 2018-03-07 DIAGNOSIS — M51379 Other intervertebral disc degeneration, lumbosacral region without mention of lumbar back pain or lower extremity pain: Secondary | ICD-10-CM

## 2018-03-07 DIAGNOSIS — M419 Scoliosis, unspecified: Secondary | ICD-10-CM

## 2018-03-07 MED ORDER — GABAPENTIN 100 MG PO CAPS: 100.0000 mg | ORAL_CAPSULE | Freq: Three times a day (TID) | ORAL | 2 refills | Status: DC

## 2018-04-03 ENCOUNTER — Ambulatory Visit: Payer: Self-pay | Admitting: Orthopedic Surgery

## 2018-04-03 ENCOUNTER — Encounter: Payer: Self-pay | Admitting: Orthopedic Surgery

## 2018-04-16 ENCOUNTER — Encounter: Payer: Self-pay | Admitting: Orthopedic Surgery

## 2018-04-16 ENCOUNTER — Ambulatory Visit (INDEPENDENT_AMBULATORY_CARE_PROVIDER_SITE_OTHER): Payer: Medicare Other | Admitting: Orthopedic Surgery

## 2018-04-16 VITALS — BP 122/78 | HR 93 | Ht 60.0 in | Wt 130.0 lb

## 2018-04-16 DIAGNOSIS — M419 Scoliosis, unspecified: Secondary | ICD-10-CM | POA: Diagnosis not present

## 2018-04-16 DIAGNOSIS — G894 Chronic pain syndrome: Secondary | ICD-10-CM

## 2018-04-16 DIAGNOSIS — M5137 Other intervertebral disc degeneration, lumbosacral region: Secondary | ICD-10-CM | POA: Diagnosis not present

## 2018-04-16 MED ORDER — GABAPENTIN 100 MG PO CAPS: 100.0000 mg | ORAL_CAPSULE | Freq: Three times a day (TID) | ORAL | 2 refills | Status: DC

## 2018-04-16 MED ORDER — HYDROCODONE-ACETAMINOPHEN 7.5-325 MG PO TABS: 1.0000 | ORAL_TABLET | ORAL | 0 refills | Status: DC | PRN

## 2018-04-16 NOTE — Progress Notes (Addendum)
Chief Complaint  Patient presents with  . Pain    Chronic pain management    84 year old female managed for chronic pain with hydrocodone and gabapentin no complications from the medication no side effects noted.  Pain controlling her bilateral shoulder pain she needed surgery several years ago age and social situation prevented from having surgery she is getting worse in terms of her range of motion of both shoulders   No side effects from the medication such as nausea vomiting constipation drowsiness  No improvement in her chronic pain related to her shoulders.  She also has degenerative disc disease in her lower back   Refill medication follow-up in 3 months per requirements  Encounter Diagnoses  Name Primary?  . Chronic pain syndrome Yes  . DDD (degenerative disc disease), lumbosacral   . Scoliosis of lumbar spine, unspecified scoliosis type

## 2018-04-16 NOTE — Addendum Note (Signed)
Addended by: Arther Abbott E on: 04/16/2018 10:19 AM   Modules accepted: Level of Service

## 2018-05-17 ENCOUNTER — Other Ambulatory Visit: Payer: Self-pay | Admitting: Orthopedic Surgery

## 2018-05-17 DIAGNOSIS — M5137 Other intervertebral disc degeneration, lumbosacral region: Secondary | ICD-10-CM

## 2018-05-17 MED ORDER — HYDROCODONE-ACETAMINOPHEN 7.5-325 MG PO TABS: 1.0000 | ORAL_TABLET | ORAL | 0 refills | Status: DC | PRN

## 2018-05-17 NOTE — Telephone Encounter (Signed)
Hydrocodone-Acetaminophen  7.5/325 mg  Qty 90 Tablets  PATIENT USES [facility]  Patient's nephew Darden Amber called this in and I reminded him that its possible it will be Monday before its done.

## 2018-06-18 ENCOUNTER — Encounter: Payer: Self-pay | Admitting: Orthopedic Surgery

## 2018-06-24 ENCOUNTER — Other Ambulatory Visit: Payer: Self-pay | Admitting: Orthopedic Surgery

## 2018-06-24 DIAGNOSIS — M5137 Other intervertebral disc degeneration, lumbosacral region: Secondary | ICD-10-CM

## 2018-06-24 MED ORDER — HYDROCODONE-ACETAMINOPHEN 7.5-325 MG PO TABS: 1.0000 | ORAL_TABLET | ORAL | 0 refills | Status: DC | PRN

## 2018-06-24 NOTE — Telephone Encounter (Signed)
Patient requests refill on Hydrocodone/Acetaminophen 7.5-325  Mgs.   Qty  90       Sig: Take 1 tablet by mouth every 4 (four) hours as needed.     Patient uses [facility]

## 2018-07-17 ENCOUNTER — Ambulatory Visit: Payer: Medicare Other | Admitting: Orthopedic Surgery

## 2018-07-23 ENCOUNTER — Other Ambulatory Visit: Payer: Self-pay | Admitting: Orthopedic Surgery

## 2018-07-24 ENCOUNTER — Ambulatory Visit (INDEPENDENT_AMBULATORY_CARE_PROVIDER_SITE_OTHER): Payer: Medicare Other | Admitting: Orthopedic Surgery

## 2018-07-24 ENCOUNTER — Encounter: Payer: Self-pay | Admitting: Orthopedic Surgery

## 2018-07-24 VITALS — BP 161/73 | HR 83 | Ht 60.0 in | Wt 139.0 lb

## 2018-07-24 DIAGNOSIS — G894 Chronic pain syndrome: Secondary | ICD-10-CM | POA: Diagnosis not present

## 2018-07-24 DIAGNOSIS — I779 Disorder of arteries and arterioles, unspecified: Secondary | ICD-10-CM | POA: Diagnosis not present

## 2018-07-24 NOTE — Progress Notes (Signed)
Chief Complaint  Patient presents with  . Medication Refill    requesting refill on Hydrocodone     Routine follow-up chronic pain management patient on hydrocodone 7.5 mg q. 6 pain 4 hours for pain  Pain medicine is working well for her she signed her pain contract she is at medium risk primarily because of her age.  She so no abuse or misuse of the medication medication is working Review of Systems  Gastrointestinal: Negative for abdominal pain, nausea and vomiting.   Assessment and plan patient is doing well with his current pain management pain management contract signed  Refill medication   Refill medication  Encounter Diagnosis  Name Primary?  . Chronic pain syndrome Yes

## 2018-08-07 ENCOUNTER — Other Ambulatory Visit: Payer: Self-pay | Admitting: Orthopedic Surgery

## 2018-08-07 DIAGNOSIS — M419 Scoliosis, unspecified: Secondary | ICD-10-CM

## 2018-08-07 DIAGNOSIS — M5137 Other intervertebral disc degeneration, lumbosacral region: Secondary | ICD-10-CM

## 2018-08-26 ENCOUNTER — Other Ambulatory Visit: Payer: Self-pay | Admitting: Orthopedic Surgery

## 2018-08-26 MED ORDER — HYDROCODONE-ACETAMINOPHEN 7.5-325 MG PO TABS: 1.0000 | ORAL_TABLET | ORAL | 0 refills | Status: DC | PRN

## 2018-08-26 NOTE — Telephone Encounter (Addendum)
Hydrocodone-Acetaminophen  7.5/325 mg  Qty 90 Tablets  Take 1 tablet by mouth every 4 (four) hours as needed.  PATIENT USES [facility]

## 2018-09-03 ENCOUNTER — Other Ambulatory Visit: Payer: Self-pay

## 2018-09-03 DIAGNOSIS — I739 Peripheral vascular disease, unspecified: Secondary | ICD-10-CM

## 2018-09-03 DIAGNOSIS — I779 Disorder of arteries and arterioles, unspecified: Secondary | ICD-10-CM

## 2018-09-19 ENCOUNTER — Other Ambulatory Visit: Payer: Self-pay | Admitting: Orthopaedic Surgery

## 2018-09-19 ENCOUNTER — Ambulatory Visit: Payer: Medicare Other | Admitting: Family

## 2018-09-19 ENCOUNTER — Ambulatory Visit (HOSPITAL_COMMUNITY): Payer: Medicare Other | Attending: Family

## 2018-09-19 DIAGNOSIS — M5137 Other intervertebral disc degeneration, lumbosacral region: Secondary | ICD-10-CM

## 2018-09-19 NOTE — Telephone Encounter (Signed)
Fax received from [place] (ph# [phone]/fax# [phone]) for medication refill: HYDROcodone-acetaminophen ([place]) 7.5-325 MG tablet 90 tablet  -Dr Aline Brochure approved on fax form only as of 09/18/18

## 2018-09-20 ENCOUNTER — Encounter: Payer: Self-pay | Admitting: Family

## 2018-09-20 MED ORDER — HYDROCODONE-ACETAMINOPHEN 7.5-325 MG PO TABS: 1.0000 | ORAL_TABLET | ORAL | 0 refills | Status: DC | PRN

## 2018-10-07 ENCOUNTER — Ambulatory Visit (HOSPITAL_COMMUNITY)
Admission: RE | Admit: 2018-10-07 | Discharge: 2018-10-07 | Disposition: A | Discharge status: Home / Self Care | Payer: Medicare Other | Source: Ambulatory Visit | Attending: Family | Admitting: Family

## 2018-10-07 ENCOUNTER — Encounter: Payer: Self-pay | Admitting: Family

## 2018-10-07 ENCOUNTER — Ambulatory Visit (INDEPENDENT_AMBULATORY_CARE_PROVIDER_SITE_OTHER): Payer: Medicare Other | Admitting: Family

## 2018-10-07 ENCOUNTER — Other Ambulatory Visit: Payer: Self-pay

## 2018-10-07 VITALS — BP 156/75 | HR 64 | Temp 96.8°F | Resp 18 | Ht 60.0 in | Wt 140.8 lb

## 2018-10-07 DIAGNOSIS — Z95828 Presence of other vascular implants and grafts: Secondary | ICD-10-CM

## 2018-10-07 DIAGNOSIS — R2681 Unsteadiness on feet: Secondary | ICD-10-CM | POA: Diagnosis not present

## 2018-10-07 DIAGNOSIS — I779 Disorder of arteries and arterioles, unspecified: Secondary | ICD-10-CM | POA: Diagnosis not present

## 2018-10-07 DIAGNOSIS — I739 Peripheral vascular disease, unspecified: Secondary | ICD-10-CM | POA: Diagnosis not present

## 2018-10-07 NOTE — Progress Notes (Signed)
[facility] [place]   CC: Follow up peripheral artery occlusive disease  History of Present Illness [name] is a 84 y.o. female who presents for follow-up today. She has a history of left femoral popliteal bypass in February 2010 with revision in May 2010. She also previously had a right femoral to above-knee popliteal bypass in 2005. These both were done by Dr. Nils Pyle. She had a more recent arterial duplex performed secondary to numbness and tingling in both of her feet. This revealed an occluded right femoral-popliteal bypass. She also complained of right calf claudication at an earlier visit, but no claudication symptoms recently.  She is able to walk as much as she wishes. This mainly involves walking around the house and some down the street. She does not describe claudication. At her previous office visit she's had several ulcerations at tips of her toes which have all completely healed spontaneously.  Dr. Oneida Alar last evaluated pt on 11-09-16. At that time she had a known occluded right femoral-popliteal bypass withno real symptoms. Continue conservative therapy. Encouraged her to continue 30 minutes of walking daily to increase collateralization.  If her symptoms worsen, we'll need to consider arteriography. She would be at high risk for contrast nephropathy given her elevated creatinine. Dr. Oneida Alar would not consider revascularization in her for claudication symptoms alone. Most of her symptoms seem to be primarily neuropathic in nature. Dr. Oneida Alar renewed her Neurontin prescription that day. She was to follow-up with [facility] nurse practitioner with repeat ABIs in 6 months.  Most recent serum creatinine and eGFR results on file were 1.99 and 25 on 02-18-16, stage 4 CKD.   Dr. Arther Abbott sees pt for chronic pain management for lumbosacral DDD, he prescribed norco 7-325, 1 tab every 4 hours prn pain.  She has a colostomy, states her colon surgery  was long ago, as an adult, does not know why she had colon surgery but states she did not have cancer.   Pt states she has no pain or weakness in her legs when she walks, but both legs hurt when she is supine.   She states she has no falling issues.   Niece states that pt is now taking Aricept, and she has to put pt meds in dispensers.    Diabetic: No Tobacco use: non-smoker  Pt meds include: Statin :No Betablocker: No ASA: no, pt states she is not taking but is on her medication list; I advised her to take daily.  Other anticoagulants/antiplatelets: no    Past Medical History:  Diagnosis Date  . Arthritis   . Cancer ([place])   . Depression   . Diabetes mellitus without complication ([place])   . Femoral-femoral bypass graft thrombosis, left ([place])   . HTN (hypertension)   . Hyperlipidemia   . Hypothyroidism   . Peripheral vascular disease ([place])   . Rotator cuff insufficiency 03/23/2011  . Thyroid disease     Social History Social History   Tobacco Use  . Smoking status: Never Smoker  . Smokeless tobacco: Never Used  Substance Use Topics  . Alcohol use: No    Alcohol/week: 0.0 standard drinks  . Drug use: No    Family History Family History  Problem Relation Age of Onset  . Stroke Mother   . Diabetes Mother        Leg amputation  . Hypertension Mother   . Heart disease Father   . Heart attack Father   . Cancer Daughter  Past Surgical History:  Procedure Laterality Date  . ABDOMINAL HYSTERECTOMY    . APPENDECTOMY    . CARPAL TUNNEL RELEASE     right side  . CATARACT EXTRACTION W/PHACO Right 06/05/2013   Procedure: CATARACT EXTRACTION PHACO AND INTRAOCULAR LENS PLACEMENT (IOC);  Surgeon: Tonny Branch, MD;  Location: [facility];  Service: Ophthalmology;  Laterality: Right;  CDE: 10.58  . CATARACT EXTRACTION W/PHACO Left 07/10/2013   Procedure: CATARACT EXTRACTION PHACO AND INTRAOCULAR LENS PLACEMENT (IOC);  Surgeon: Tonny Branch, MD;  Location: [facility];  Service:  Ophthalmology;  Laterality: Left;  CDE: 17.35  . CHOLECYSTECTOMY    . COLON SURGERY     sigmoid colectomy complicated by anastomotic leak  . COLOSTOMY  transverse loop, 2006  . EYE SURGERY    . grafts of bilateral lower extremities    . left shoulder hemiarthroplasty    . TONSILLECTOMY    . VESICOVAGINAL FISTULA CLOSURE W/ TAH      No Known Allergies  Current Outpatient Medications  Medication Sig Dispense Refill  . amLODipine (NORVASC) 10 MG tablet Take 10 mg by mouth daily.    Marland Kitchen aspirin 81 MG tablet Take 81 mg by mouth daily. Reported on 02/21/2016    . Dextromethorphan-Quinidine (NUEDEXTA) 20-10 MG CAPS Take 1 capsule by mouth 2 (two) times daily.    . diclofenac sodium (VOLTAREN) 1 % GEL Apply topically 4 (four) times daily.    . ferrous sulfate 325 (65 FE) MG tablet Take 325 mg by mouth daily with breakfast.    . gabapentin (NEURONTIN) 100 MG capsule TAKE 1 CAPSULE BY MOUTH THREE TIMES DAILY. 90 capsule 0  . HYDROcodone-acetaminophen (NORCO) 7.5-325 MG tablet Take 1 tablet by mouth every 4 (four) hours as needed. 90 tablet 0  . HYDROcodone-acetaminophen (NORCO) 7.5-325 MG tablet Take 1 tablet by mouth every 4 (four) hours as needed. 90 tablet 0  . ibuprofen (ADVIL,MOTRIN) 400 MG tablet Take 1 tablet (400 mg total) by mouth every 6 (six) hours as needed. 90 tablet 5  . levothyroxine (SYNTHROID, LEVOTHROID) 75 MCG tablet Take 75 mcg by mouth daily.      . Multiple Vitamin (MULTIVITAMIN WITH MINERALS) TABS Take 1 tablet by mouth daily.     No current facility-administered medications for this visit.     ROS: See HPI for pertinent positives and negatives.   Physical Examination  Vitals:   10/07/18 1410  BP: (!) 156/75  Pulse: 64  Resp: 18  Temp: (!) 96.8 F (36 C)  TempSrc: Oral  SpO2: 99%  Weight: 140 lb 12.8 oz (63.9 kg)  Height: 5' (1.524 m)   Body mass index is 27.5 kg/m.  General: A&O x 2, WDWN, elderly female. Gait: normal HENT: No gross abnormalities   Eyes: PERRLA. Pulmonary: Respirations are non labored, CTAB, good air movement in all fields. Cardiac: regular rhythm and rate, no detected murmur.         Carotid Bruits Right Left   Negative Negative   Radial pulses are 1+ palpable bilaterally   Adominal aortic pulse is not palpable                         VASCULAR EXAM: Extremities without ischemic changes, without Gangrene; without open wounds.  LE Pulses Right Left       FEMORAL   palpable   palpable        POPLITEAL  not palpable   not palpable       POSTERIOR TIBIAL  not palpable   not palpable        DORSALIS PEDIS      ANTERIOR TIBIAL not palpable  not palpable    Abdomen: soft, NT, no masses. Colostomy with appliance RUQ.  Skin: no rashes, no cellulitis, no ulcers noted. Musculoskeletal: no muscle wasting or atrophy.      Neurologic: A&O X 3; Appropriate Affect ; SENSATION: normal; MOTOR FUNCTION:  moving all extremities equally, motor strength 5/5 throughout. Speech is fluent/normal. CN 2-12 intact. Psychiatric: Normal thought content, mood appropriate for clinical situation   ASSESSMENT: [name] is a 84 y.o. female who is s/p left femoral popliteal bypass in February 2010 with revision in May 2010. She also previously had a right femoral to above-knee popliteal bypass in 2005. These both were done by Dr. Nils Pyle. She had a more recent arterial duplex performed secondary to numbness and tingling in both of her feet. This revealed an occluded right femoral-popliteal bypass.  She has not claudication sx's with walking and can walk as much as she wishes.  There are no signs of ischemia in her feet or legs.   She seems to stay active.  Fortunately she has never used tobacco and does not have DM.   At niece request, prescription for 3 or 4  pronged base cane sent to her pharmacy; niece is one of two guardians for pt.   DATA  ABI (Date: 10/07/2018):  R:   ABI: 0.73 (was 0.56 on 12-17-17),   PT: mono  DP: mono  TBI:  0.42, toe pressure 65, (was 0.30)  L:   ABI: 0.99 (was 0.93),   PT: tri  DP: mono  TBI: 0.50, toe pressure 77, (was 0.43) Improved bilateral ABI and TBI; moderate disease in the right, normal in the left. Monophasic waveforms in the right, mono and triphasic in the left.     PLAN:  I advised pt to walk a total of 30 minutes daily or more if she has no trouble with her balance or falling. Daily seated leg exercises also discussed and demonstrated.  Based on the patient's vascular studies and examination, pt will return to clinic in 9 months with ABI's.  I advised her and her niece to notify us if she develops concerns re the circulation in her feet or legs.    I discussed in depth with the patient the nature of atherosclerosis, and emphasized the importance of maximal medical management including strict control of blood pressure, blood glucose, and lipid levels, obtaining regular exercise, and continued cessation of smoking.  The patient is aware that without maximal medical management the underlying atherosclerotic disease process will progress, limiting the benefit of any interventions.  The patient was given information about PAD including signs, symptoms, treatment, what symptoms should prompt the patient to seek immediate medical care, and risk reduction measures to take.  Clemon Chambers, RN, MSN, FNP-C [facility] Phone: [phone]  Clinic MD: Trula Slade  10/07/18 2:26 PM

## 2018-10-07 NOTE — Patient Instructions (Signed)

## 2018-10-09 ENCOUNTER — Encounter (HOSPITAL_COMMUNITY): Payer: Self-pay | Admitting: Emergency Medicine

## 2018-10-09 ENCOUNTER — Emergency Department (HOSPITAL_COMMUNITY)
Admission: EM | Admit: 2018-10-09 | Discharge: 2018-10-09 | Disposition: A | Discharge status: Home / Self Care | Payer: Medicare Other | Attending: Emergency Medicine | Admitting: Emergency Medicine

## 2018-10-09 ENCOUNTER — Other Ambulatory Visit: Payer: Self-pay

## 2018-10-09 ENCOUNTER — Emergency Department (HOSPITAL_COMMUNITY): Payer: Medicare Other

## 2018-10-09 DIAGNOSIS — E039 Hypothyroidism, unspecified: Secondary | ICD-10-CM | POA: Insufficient documentation

## 2018-10-09 DIAGNOSIS — S0083XA Contusion of other part of head, initial encounter: Secondary | ICD-10-CM | POA: Diagnosis not present

## 2018-10-09 DIAGNOSIS — Z79899 Other long term (current) drug therapy: Secondary | ICD-10-CM | POA: Insufficient documentation

## 2018-10-09 DIAGNOSIS — E119 Type 2 diabetes mellitus without complications: Secondary | ICD-10-CM | POA: Insufficient documentation

## 2018-10-09 DIAGNOSIS — W01198A Fall on same level from slipping, tripping and stumbling with subsequent striking against other object, initial encounter: Secondary | ICD-10-CM | POA: Diagnosis not present

## 2018-10-09 DIAGNOSIS — I10 Essential (primary) hypertension: Secondary | ICD-10-CM | POA: Insufficient documentation

## 2018-10-09 DIAGNOSIS — Y999 Unspecified external cause status: Secondary | ICD-10-CM | POA: Insufficient documentation

## 2018-10-09 DIAGNOSIS — S0990XA Unspecified injury of head, initial encounter: Secondary | ICD-10-CM | POA: Diagnosis present

## 2018-10-09 DIAGNOSIS — Y9389 Activity, other specified: Secondary | ICD-10-CM | POA: Insufficient documentation

## 2018-10-09 DIAGNOSIS — W19XXXA Unspecified fall, initial encounter: Secondary | ICD-10-CM

## 2018-10-09 DIAGNOSIS — Y9289 Other specified places as the place of occurrence of the external cause: Secondary | ICD-10-CM | POA: Insufficient documentation

## 2018-10-09 NOTE — Discharge Instructions (Addendum)
Take Tylenol for pain and follow-up with your doctor next week if any problems

## 2018-10-09 NOTE — ED Provider Notes (Signed)
[facility] EMERGENCY DEPARTMENT Provider Note   CSN: [id number] Arrival date & time: 10/09/18  1424     History   Chief Complaint Chief Complaint  Patient presents with  . Fall    HPI [name] is a 84 y.o. female.  Patient states she tripped and fell and hit her head today no loss of consciousness.  Patient complains of pain to left forehead  The history is provided by the patient. No language interpreter was used.  Fall  This is a new problem. The current episode started 3 to 5 hours ago. The problem occurs rarely. The problem has been resolved. Pertinent negatives include no chest pain, no abdominal pain and no headaches. Nothing aggravates the symptoms. Nothing relieves the symptoms. She has tried nothing for the symptoms. The treatment provided no relief.    Past Medical History:  Diagnosis Date  . Arthritis   . Cancer ([place])   . Depression   . Diabetes mellitus without complication ([place])   . Femoral-femoral bypass graft thrombosis, left ([place])   . HTN (hypertension)   . Hyperlipidemia   . Hypothyroidism   . Peripheral vascular disease ([place])   . Rotator cuff insufficiency 03/23/2011  . Thyroid disease     Patient Active Problem List   Diagnosis Date Noted  . Pain in limb-Right Leg>Left 04/29/2014  . Peripheral vascular disease, unspecified ([place]) 09/04/2013  . Cervical stenosis of spine 05/29/2012  . Neck pain 05/29/2012  . Atherosclerosis of native arteries of the extremities with intermittent claudication 03/21/2012  . Colostomy complication-loop colostomy 10/2005 02/27/2012  . Rotator cuff insufficiency 03/23/2011  . SHOULDER PAIN 04/11/2010  . IMPINGEMENT SYNDROME 04/11/2010  . SHOULDER JOINT REPLACEMENT BY OTHER MEANS 04/11/2010  . OLD DISRUPTION OF POSTERIOR CRUCIATE LIGAMENT 04/06/2010  . DEGENERATIVE JOINT DISEASE, LEFT KNEE 12/25/2007  . KNEE PAIN 12/25/2007  . HIGH BLOOD PRESSURE 12/24/2007    Past Surgical History:  Procedure Laterality  Date  . ABDOMINAL HYSTERECTOMY    . APPENDECTOMY    . CARPAL TUNNEL RELEASE     right side  . CATARACT EXTRACTION W/PHACO Right 06/05/2013   Procedure: CATARACT EXTRACTION PHACO AND INTRAOCULAR LENS PLACEMENT (IOC);  Surgeon: Tonny Branch, MD;  Location: [facility];  Service: Ophthalmology;  Laterality: Right;  CDE: 10.58  . CATARACT EXTRACTION W/PHACO Left 07/10/2013   Procedure: CATARACT EXTRACTION PHACO AND INTRAOCULAR LENS PLACEMENT (IOC);  Surgeon: Tonny Branch, MD;  Location: [facility];  Service: Ophthalmology;  Laterality: Left;  CDE: 17.35  . CHOLECYSTECTOMY    . COLON SURGERY     sigmoid colectomy complicated by anastomotic leak  . COLOSTOMY  transverse loop, 2006  . EYE SURGERY    . grafts of bilateral lower extremities    . left shoulder hemiarthroplasty    . TONSILLECTOMY    . VESICOVAGINAL FISTULA CLOSURE W/ TAH       OB History   None      Home Medications    Prior to Admission medications   Medication Sig Start Date End Date Taking? Authorizing Provider  amLODipine (NORVASC) 10 MG tablet Take 10 mg by mouth daily.   Yes [provider]  HYDROcodone-acetaminophen (NORCO) 7.5-325 MG tablet Take 1 tablet by mouth every 4 (four) hours as needed. 09/20/18  Yes Carole Civil, MD  HYDROcodone-acetaminophen (NORCO) 7.5-325 MG tablet Take 1 tablet by mouth every 4 (four) hours as needed. 08/26/18   Carole Civil, MD    Family History Family History  Problem  Relation Age of Onset  . Stroke Mother   . Diabetes Mother        Leg amputation  . Hypertension Mother   . Heart disease Father   . Heart attack Father   . Cancer Daughter     Social History Social History   Tobacco Use  . Smoking status: Never Smoker  . Smokeless tobacco: Never Used  Substance Use Topics  . Alcohol use: No    Alcohol/week: 0.0 standard drinks  . Drug use: No     Allergies   Patient has no known allergies.   Review of Systems Review of Systems  Constitutional: Negative  for appetite change and fatigue.  HENT: Negative for congestion, ear discharge and sinus pressure.        Headache  Eyes: Negative for discharge.  Respiratory: Negative for cough.   Cardiovascular: Negative for chest pain.  Gastrointestinal: Negative for abdominal pain and diarrhea.  Genitourinary: Negative for frequency and hematuria.  Musculoskeletal: Negative for back pain.  Skin: Negative for rash.  Neurological: Negative for seizures and headaches.  Psychiatric/Behavioral: Negative for hallucinations.     Physical Exam Updated Vital Signs BP (!) 136/95 (BP Location: Right Arm)   Pulse 80   Temp 97.9 F (36.6 C) (Oral)   Resp 12   Ht 5' (1.524 m)   Wt 60.8 kg   SpO2 100%   BMI 26.17 kg/m   Physical Exam  Constitutional: She is oriented to person, place, and time. She appears well-developed.  HENT:  Head: Normocephalic.  Contusion and swelling to left orbit  Eyes: Conjunctivae and EOM are normal. No scleral icterus.  Neck: Neck supple. No thyromegaly present.  Cardiovascular: Normal rate and regular rhythm. Exam reveals no gallop and no friction rub.  No murmur heard. Pulmonary/Chest: No stridor. She has no wheezes. She has no rales. She exhibits no tenderness.  Abdominal: She exhibits no distension. There is no tenderness. There is no rebound.  Musculoskeletal: Normal range of motion. She exhibits no edema.  Lymphadenopathy:    She has no cervical adenopathy.  Neurological: She is oriented to person, place, and time. She exhibits normal muscle tone. Coordination normal.  Skin: No rash noted. No erythema.  Psychiatric: She has a normal mood and affect. Her behavior is normal.     ED Treatments / Results  Labs (all labs ordered are listed, but only abnormal results are displayed) Labs Reviewed - No data to display  EKG None  Radiology Ct Head Wo Contrast  Result Date: 10/09/2018 CLINICAL DATA:  Trip and fall, head injury. No loss of consciousness. History  of hypertension, hyperlipidemia, diabetes. EXAM: CT HEAD WITHOUT CONTRAST CT CERVICAL SPINE WITHOUT CONTRAST TECHNIQUE: Multidetector CT imaging of the head and cervical spine was performed following the standard protocol without intravenous contrast. Multiplanar CT image reconstructions of the cervical spine were also generated. COMPARISON:  MRI cervical spine June 15, 2012 and CT HEAD February 27, 2009 FINDINGS: CT HEAD FINDINGS BRAIN: No intraparenchymal hemorrhage, mass effect nor midline shift. The ventricles and sulci are normal for age. Patchy supratentorial white matter hypodensities within normal range for patient's age, though non-specific are most compatible with chronic small vessel ischemic disease. No acute large vascular territory infarcts. No abnormal extra-axial fluid collections. Basal cisterns are patent. VASCULAR: Moderate calcific atherosclerosis of the carotid siphons. SKULL: No skull fracture. Large LEFT frontal to periorbital scalp hematoma without subcutaneous gas or radiopaque foreign bodies; no LEFT postseptal extension. SINUSES/ORBITS: Trace paranasal sinus mucosal thickening.  Mastoid air cells are well aerated.The included ocular globes and orbital contents are non-suspicious. Status post bilateral ocular lens implants. OTHER: None. CT CERVICAL SPINE FINDINGS ALIGNMENT: Maintained lordosis. Vertebral bodies in alignment. SKULL BASE AND VERTEBRAE: Cervical vertebral bodies and posterior elements are intact. Severe C6-7 and mild C7-T1 stable disc height loss. Stable C6-7 and C7-T1 endplate sclerosis. Stable C4-5 through C7-T1 ventral endplate spurring. C3-4 facet arthrodesis on degenerative basis. Similar mildly advance facet arthropathy. Osteopenia without destructive bony lesions. C1-2 articulation maintained with severe arthropathy. SOFT TISSUES AND SPINAL CANAL: Nonacute. Mild calcific atherosclerosis carotid bifurcations. Mild calcific atherosclerosis aortic arch. DISC LEVELS: No  significant osseous canal stenosis. Severe LEFT C3-4 neural foraminal narrowing. UPPER CHEST: Lung apices are clear. OTHER: None. IMPRESSION: CT HEAD: 1. No acute intracranial process. Large LEFT frontal to periorbital scalp hematoma without postseptal involvement. 2. Otherwise negative non-contrast CT HEAD for age. CT CERVICAL SPINE: 1. No fracture or malalignment. 2. Stable degenerative change of the cervical spine. Electronically Signed   By: Elon Alas M.D.   On: 10/09/2018 16:54   Ct Cervical Spine Wo Contrast  Result Date: 10/09/2018 CLINICAL DATA:  Trip and fall, head injury. No loss of consciousness. History of hypertension, hyperlipidemia, diabetes. EXAM: CT HEAD WITHOUT CONTRAST CT CERVICAL SPINE WITHOUT CONTRAST TECHNIQUE: Multidetector CT imaging of the head and cervical spine was performed following the standard protocol without intravenous contrast. Multiplanar CT image reconstructions of the cervical spine were also generated. COMPARISON:  MRI cervical spine June 15, 2012 and CT HEAD February 27, 2009 FINDINGS: CT HEAD FINDINGS BRAIN: No intraparenchymal hemorrhage, mass effect nor midline shift. The ventricles and sulci are normal for age. Patchy supratentorial white matter hypodensities within normal range for patient's age, though non-specific are most compatible with chronic small vessel ischemic disease. No acute large vascular territory infarcts. No abnormal extra-axial fluid collections. Basal cisterns are patent. VASCULAR: Moderate calcific atherosclerosis of the carotid siphons. SKULL: No skull fracture. Large LEFT frontal to periorbital scalp hematoma without subcutaneous gas or radiopaque foreign bodies; no LEFT postseptal extension. SINUSES/ORBITS: Trace paranasal sinus mucosal thickening. Mastoid air cells are well aerated.The included ocular globes and orbital contents are non-suspicious. Status post bilateral ocular lens implants. OTHER: None. CT CERVICAL SPINE FINDINGS  ALIGNMENT: Maintained lordosis. Vertebral bodies in alignment. SKULL BASE AND VERTEBRAE: Cervical vertebral bodies and posterior elements are intact. Severe C6-7 and mild C7-T1 stable disc height loss. Stable C6-7 and C7-T1 endplate sclerosis. Stable C4-5 through C7-T1 ventral endplate spurring. C3-4 facet arthrodesis on degenerative basis. Similar mildly advance facet arthropathy. Osteopenia without destructive bony lesions. C1-2 articulation maintained with severe arthropathy. SOFT TISSUES AND SPINAL CANAL: Nonacute. Mild calcific atherosclerosis carotid bifurcations. Mild calcific atherosclerosis aortic arch. DISC LEVELS: No significant osseous canal stenosis. Severe LEFT C3-4 neural foraminal narrowing. UPPER CHEST: Lung apices are clear. OTHER: None. IMPRESSION: CT HEAD: 1. No acute intracranial process. Large LEFT frontal to periorbital scalp hematoma without postseptal involvement. 2. Otherwise negative non-contrast CT HEAD for age. CT CERVICAL SPINE: 1. No fracture or malalignment. 2. Stable degenerative change of the cervical spine. Electronically Signed   By: Elon Alas M.D.   On: 10/09/2018 16:54    Procedures Procedures (including critical care time)  Medications Ordered in ED Medications - No data to display   Initial Impression / Assessment and Plan / ED Course  I have reviewed the triage vital signs and the nursing notes.  Pertinent labs & imaging results that were available during my care of the patient  were reviewed by me and considered in my medical decision making (see chart for details).     Fall.  Head contusion.  Pt to follow up with her md  Final Clinical Impressions(s) / ED Diagnoses   Final diagnoses:  Fall, initial encounter    ED Discharge Orders    None       Milton Ferguson, MD 10/09/18 1728

## 2018-10-09 NOTE — ED Triage Notes (Signed)
Patient states she was walking to the car and tripped falling and hitting her head. Patient has swelling noted to left side of forehead. States she is not on blood thinners. Denies LOC.

## 2018-10-10 ENCOUNTER — Telehealth: Payer: Self-pay | Admitting: *Deleted

## 2018-10-10 NOTE — Telephone Encounter (Signed)
Niece called requesting an order for a cane as discussed with Vinnie Level, NP.  Patient had experienced a fall yesterday sending her to the ED and now the niece would like an order for a walker with a seat.  After specking to niece I explained  that I felt patient would need evaluated by PT before ordering a cane/walker.  I contacted [facility].

## 2018-10-10 NOTE — Addendum Note (Signed)
Addended by: Viann Fish on: 10/10/2018 04:12 PM   Modules accepted: Orders

## 2018-10-11 ENCOUNTER — Other Ambulatory Visit: Payer: Self-pay | Admitting: Orthopedic Surgery

## 2018-10-11 ENCOUNTER — Telehealth: Payer: Self-pay | Admitting: Orthopedic Surgery

## 2018-10-11 NOTE — Telephone Encounter (Signed)
Call from patient and nephew, Alva Garnet, designated party contact, relaying that patient was treated at [facility] Emergency room 10/09/18- states fell when getting out of [employer] van. Main injury is head contusion. Wanted to let Dr Aline Brochure know. Also asked about pain medication refill - I relayed last refill was 09/21/18 for 90-day supply.

## 2018-12-02 ENCOUNTER — Telehealth: Payer: Self-pay | Admitting: Orthopedic Surgery

## 2018-12-02 NOTE — Telephone Encounter (Signed)
Patient of Dr. Ruthe Mannan requests refill on Hydrocodone/Acetaminophen 7.5-325  Mgs.  Qty  90  Sig: Take 1 tablet by mouth every 4 (four) hours as needed.   Patient uses [facility]

## 2018-12-03 MED ORDER — HYDROCODONE-ACETAMINOPHEN 7.5-325 MG PO TABS: 1.0000 | ORAL_TABLET | ORAL | 0 refills | Status: DC | PRN

## 2019-01-06 ENCOUNTER — Other Ambulatory Visit: Payer: Self-pay | Admitting: Orthopedic Surgery

## 2019-01-06 ENCOUNTER — Telehealth: Payer: Self-pay | Admitting: Orthopedic Surgery

## 2019-01-06 ENCOUNTER — Other Ambulatory Visit: Payer: Self-pay | Admitting: Orthopaedic Surgery

## 2019-01-06 NOTE — Telephone Encounter (Signed)
Patient of Dr. Ruthe Mannan requests refill on Hydrocodone/Acetaminophen 7.5-325  Mgs.   Qty  90  Sig: Take 1 tablet by mouth every 4 (four) hours as needed.  Patient uses [facility]

## 2019-01-27 ENCOUNTER — Encounter: Payer: Self-pay | Admitting: Orthopedic Surgery

## 2019-01-27 ENCOUNTER — Ambulatory Visit: Payer: Medicare Other | Admitting: Orthopedic Surgery

## 2019-01-27 NOTE — Progress Notes (Deleted)
Progress Note   Patient ID: [name], female   DOB: 1926-07-10, 85 y.o.   MRN: [id number]   No chief complaint on file.   HPI   ROS   No Known Allergies   There were no vitals taken for this visit.  Physical Exam   Medical decisions:   Data  Imaging:   ***  No diagnosis found.  PLAN:   ***    Arther Abbott, MD 01/27/2019 8:03 AM

## 2019-01-30 ENCOUNTER — Other Ambulatory Visit: Payer: Self-pay | Admitting: Orthopedic Surgery

## 2019-01-30 ENCOUNTER — Other Ambulatory Visit: Payer: Self-pay | Admitting: Orthopaedic Surgery

## 2019-01-30 NOTE — Telephone Encounter (Signed)
Patient forgot her appointment this past Monday.  She has rescheduled for this coming Wednesday, 02/05/19.  Her meds are due to run out before that date.  She would like a refill on  Hydrocodone/Acetaminophen 7.5-325 mgs.  QAty  690       Sig: TAKE ONE TABLET BY MOUTH EVERY 4 HOURS AS NEEDED.        Patient uses [facility]

## 2019-02-05 ENCOUNTER — Ambulatory Visit (INDEPENDENT_AMBULATORY_CARE_PROVIDER_SITE_OTHER): Payer: Medicare Other | Admitting: Orthopedic Surgery

## 2019-02-05 DIAGNOSIS — M5137 Other intervertebral disc degeneration, lumbosacral region: Secondary | ICD-10-CM

## 2019-02-05 MED ORDER — HYDROCODONE-ACETAMINOPHEN 7.5-325 MG PO TABS: 1.0000 | ORAL_TABLET | ORAL | 0 refills | Status: DC | PRN

## 2019-02-05 NOTE — Progress Notes (Signed)
Pain management fu    Current Outpatient Medications:  .  amLODipine (NORVASC) 10 MG tablet, Take 10 mg by mouth daily., Disp: , Rfl:  .  gabapentin (NEURONTIN) 100 MG capsule, TAKE 1 CAPSULE BY MOUTH THREE TIMES DAILY., Disp: 90 capsule, Rfl: 0 .  HYDROcodone-acetaminophen (NORCO) 7.5-325 MG tablet, Take 1 tablet by mouth every 4 (four) hours as needed., Disp: 90 tablet, Rfl: 0 .  HYDROcodone-acetaminophen (NORCO) 7.5-325 MG tablet, Take 1 tablet by mouth every 4 (four) hours as needed., Disp: 90 tablet, Rfl: 0 .  HYDROcodone-acetaminophen (NORCO) 7.5-325 MG tablet, TAKE ONE TABLET BY MOUTH EVERY 4 HOURS AS NEEDED., Disp: 90 tablet, Rfl: 0 .  HYDROcodone-acetaminophen (NORCO) 7.5-325 MG tablet, TAKE ONE TABLET BY MOUTH EVERY 4 HOURS AS NEEDED., Disp: 90 tablet, Rfl: 0 .  HYDROcodone-acetaminophen (NORCO) 7.5-325 MG tablet, Take 1 tablet by mouth every 4 (four) hours as needed., Disp: 90 tablet, Rfl: 38  85 year old female with chronic pain from bilateral rotator cuff arthropathy  She is managed well with hydrocodone.  She is compliant.  She has had no complications from medication  ROS  Denies nausea vomiting constipation confusion shortness of breath   Medication was refilled follow-up in 3 months  Meds ordered this encounter  Medications  . HYDROcodone-acetaminophen (NORCO) 7.5-325 MG tablet    Sig: Take 1 tablet by mouth every 4 (four) hours as needed.    Dispense:  90 tablet    Refill:  0

## 2019-03-18 ENCOUNTER — Other Ambulatory Visit: Payer: Self-pay | Admitting: Orthopedic Surgery

## 2019-03-18 DIAGNOSIS — M5137 Other intervertebral disc degeneration, lumbosacral region: Secondary | ICD-10-CM

## 2019-03-18 NOTE — Telephone Encounter (Signed)
Patient requests refill on Hydrocodone/Acetaminophen 7.5-325  Mgs.  Qty  90   Sig: Take 1 tablet by mouth every 4 (four) hours as needed.   Patient uses [facility]

## 2019-03-19 MED ORDER — HYDROCODONE-ACETAMINOPHEN 7.5-325 MG PO TABS: 1.0000 | ORAL_TABLET | ORAL | 0 refills | Status: DC | PRN

## 2019-04-04 ENCOUNTER — Other Ambulatory Visit: Payer: Self-pay | Admitting: Orthopaedic Surgery

## 2019-04-04 ENCOUNTER — Other Ambulatory Visit: Payer: Self-pay | Admitting: Orthopedic Surgery

## 2019-04-22 ENCOUNTER — Other Ambulatory Visit: Payer: Self-pay | Admitting: Orthopedic Surgery

## 2019-05-07 ENCOUNTER — Ambulatory Visit (INDEPENDENT_AMBULATORY_CARE_PROVIDER_SITE_OTHER): Payer: Medicare Other | Admitting: Orthopedic Surgery

## 2019-05-07 ENCOUNTER — Other Ambulatory Visit: Payer: Self-pay

## 2019-05-07 ENCOUNTER — Encounter: Payer: Self-pay | Admitting: Orthopedic Surgery

## 2019-05-07 VITALS — BP 130/66 | HR 79 | Temp 98.2°F | Ht 60.0 in | Wt 152.0 lb

## 2019-05-07 DIAGNOSIS — G894 Chronic pain syndrome: Secondary | ICD-10-CM | POA: Diagnosis not present

## 2019-05-07 MED ORDER — HYDROCODONE-ACETAMINOPHEN 7.5-325 MG PO TABS: 1.0000 | ORAL_TABLET | ORAL | 0 refills | Status: DC | PRN

## 2019-05-07 NOTE — Progress Notes (Signed)
Chief Complaint  Patient presents with  . Pain   HPI  85 year old female presents for three-month evaluation for chronic pain and chronic pain management  She is currently on gabapentin and hydrocodone 7.5 mg every 4 hours as needed for chronic pain in her shoulders.  ROS  She is not having any side effects from the medication such as nausea vomiting diarrhea or constipation dizziness or shortness of breath  The medication is helping her to perform her activities of daily living such as cleaning eating bathing  Encounter Diagnosis  Name Primary?  . Chronic pain syndrome Yes      Meds ordered this encounter  Medications  . HYDROcodone-acetaminophen (NORCO) 7.5-325 MG tablet    Sig: Take 1 tablet by mouth every 4 (four) hours as needed for up to 60 doses.    Dispense:  60 tablet    Refill:  0    Follow-up 3 months

## 2019-05-27 ENCOUNTER — Other Ambulatory Visit: Payer: Self-pay | Admitting: Orthopedic Surgery

## 2019-05-27 DIAGNOSIS — G894 Chronic pain syndrome: Secondary | ICD-10-CM

## 2019-06-04 ENCOUNTER — Other Ambulatory Visit: Payer: Self-pay

## 2019-06-04 ENCOUNTER — Telehealth: Payer: Self-pay

## 2019-06-04 DIAGNOSIS — G894 Chronic pain syndrome: Secondary | ICD-10-CM

## 2019-06-04 MED ORDER — HYDROCODONE-ACETAMINOPHEN 7.5-325 MG PO TABS: 1.0000 | ORAL_TABLET | ORAL | 0 refills | Status: DC | PRN

## 2019-06-04 MED ORDER — GABAPENTIN 100 MG PO CAPS: 100.0000 mg | ORAL_CAPSULE | Freq: Three times a day (TID) | ORAL | 0 refills | Status: AC

## 2019-06-04 NOTE — Telephone Encounter (Signed)
Gabapentin 100 mg  Qty 90 capsules  Take 1 capsule by mouth three times daily.  PATIENT USES [facility]

## 2019-06-04 NOTE — Telephone Encounter (Signed)
Hydrocodone-Acetaminophen  7.5/325 mg  Qty 60 Tablets  Take 1 tablet by mouth every 4 (four) hours as needed for up to 60 doses.   PATIENT USES [facility]

## 2019-06-10 DIAGNOSIS — E039 Hypothyroidism, unspecified: Secondary | ICD-10-CM | POA: Diagnosis not present

## 2019-06-10 DIAGNOSIS — Z6829 Body mass index (BMI) 29.0-29.9, adult: Secondary | ICD-10-CM | POA: Diagnosis not present

## 2019-06-10 DIAGNOSIS — M130 Polyarthritis, unspecified: Secondary | ICD-10-CM | POA: Diagnosis not present

## 2019-06-10 DIAGNOSIS — I10 Essential (primary) hypertension: Secondary | ICD-10-CM | POA: Diagnosis not present

## 2019-06-10 DIAGNOSIS — J398 Other specified diseases of upper respiratory tract: Secondary | ICD-10-CM | POA: Diagnosis not present

## 2019-06-13 DIAGNOSIS — Z933 Colostomy status: Secondary | ICD-10-CM | POA: Diagnosis not present

## 2019-06-20 ENCOUNTER — Other Ambulatory Visit: Payer: Self-pay

## 2019-06-20 ENCOUNTER — Ambulatory Visit (HOSPITAL_COMMUNITY): Payer: Medicare HMO | Attending: Family Medicine | Admitting: Specialist

## 2019-06-20 ENCOUNTER — Encounter (HOSPITAL_COMMUNITY): Payer: Self-pay | Admitting: Specialist

## 2019-06-20 DIAGNOSIS — G8929 Other chronic pain: Secondary | ICD-10-CM

## 2019-06-20 DIAGNOSIS — F0390 Unspecified dementia without behavioral disturbance: Secondary | ICD-10-CM | POA: Diagnosis present

## 2019-06-20 DIAGNOSIS — E870 Hyperosmolality and hypernatremia: Secondary | ICD-10-CM | POA: Diagnosis not present

## 2019-06-20 DIAGNOSIS — N179 Acute kidney failure, unspecified: Secondary | ICD-10-CM | POA: Diagnosis not present

## 2019-06-20 DIAGNOSIS — R918 Other nonspecific abnormal finding of lung field: Secondary | ICD-10-CM | POA: Diagnosis not present

## 2019-06-20 DIAGNOSIS — Z90710 Acquired absence of both cervix and uterus: Secondary | ICD-10-CM | POA: Diagnosis not present

## 2019-06-20 DIAGNOSIS — Z79890 Hormone replacement therapy (postmenopausal): Secondary | ICD-10-CM | POA: Diagnosis not present

## 2019-06-20 DIAGNOSIS — J1289 Other viral pneumonia: Secondary | ICD-10-CM | POA: Diagnosis not present

## 2019-06-20 DIAGNOSIS — E869 Volume depletion, unspecified: Secondary | ICD-10-CM | POA: Diagnosis present

## 2019-06-20 DIAGNOSIS — R945 Abnormal results of liver function studies: Secondary | ICD-10-CM | POA: Diagnosis not present

## 2019-06-20 DIAGNOSIS — M25611 Stiffness of right shoulder, not elsewhere classified: Secondary | ICD-10-CM | POA: Insufficient documentation

## 2019-06-20 DIAGNOSIS — E872 Acidosis: Secondary | ICD-10-CM | POA: Diagnosis not present

## 2019-06-20 DIAGNOSIS — Z515 Encounter for palliative care: Secondary | ICD-10-CM | POA: Diagnosis not present

## 2019-06-20 DIAGNOSIS — E1122 Type 2 diabetes mellitus with diabetic chronic kidney disease: Secondary | ICD-10-CM | POA: Diagnosis present

## 2019-06-20 DIAGNOSIS — I129 Hypertensive chronic kidney disease with stage 1 through stage 4 chronic kidney disease, or unspecified chronic kidney disease: Secondary | ICD-10-CM | POA: Diagnosis present

## 2019-06-20 DIAGNOSIS — E785 Hyperlipidemia, unspecified: Secondary | ICD-10-CM | POA: Diagnosis present

## 2019-06-20 DIAGNOSIS — J9601 Acute respiratory failure with hypoxia: Secondary | ICD-10-CM | POA: Diagnosis not present

## 2019-06-20 DIAGNOSIS — N184 Chronic kidney disease, stage 4 (severe): Secondary | ICD-10-CM | POA: Diagnosis not present

## 2019-06-20 DIAGNOSIS — R0602 Shortness of breath: Secondary | ICD-10-CM | POA: Diagnosis not present

## 2019-06-20 DIAGNOSIS — U071 COVID-19: Secondary | ICD-10-CM | POA: Diagnosis not present

## 2019-06-20 DIAGNOSIS — E43 Unspecified severe protein-calorie malnutrition: Secondary | ICD-10-CM | POA: Diagnosis not present

## 2019-06-20 DIAGNOSIS — Z66 Do not resuscitate: Secondary | ICD-10-CM | POA: Diagnosis not present

## 2019-06-20 DIAGNOSIS — T380X5A Adverse effect of glucocorticoids and synthetic analogues, initial encounter: Secondary | ICD-10-CM | POA: Diagnosis present

## 2019-06-20 DIAGNOSIS — R531 Weakness: Secondary | ICD-10-CM | POA: Diagnosis not present

## 2019-06-20 DIAGNOSIS — E1165 Type 2 diabetes mellitus with hyperglycemia: Secondary | ICD-10-CM | POA: Diagnosis not present

## 2019-06-20 DIAGNOSIS — M25511 Pain in right shoulder: Secondary | ICD-10-CM | POA: Insufficient documentation

## 2019-06-20 DIAGNOSIS — I10 Essential (primary) hypertension: Secondary | ICD-10-CM | POA: Diagnosis not present

## 2019-06-20 DIAGNOSIS — J189 Pneumonia, unspecified organism: Secondary | ICD-10-CM | POA: Diagnosis not present

## 2019-06-20 DIAGNOSIS — D638 Anemia in other chronic diseases classified elsewhere: Secondary | ICD-10-CM | POA: Diagnosis present

## 2019-06-20 DIAGNOSIS — E039 Hypothyroidism, unspecified: Secondary | ICD-10-CM | POA: Diagnosis not present

## 2019-06-20 DIAGNOSIS — Z79899 Other long term (current) drug therapy: Secondary | ICD-10-CM | POA: Diagnosis not present

## 2019-06-20 DIAGNOSIS — E875 Hyperkalemia: Secondary | ICD-10-CM | POA: Diagnosis present

## 2019-06-20 DIAGNOSIS — E1151 Type 2 diabetes mellitus with diabetic peripheral angiopathy without gangrene: Secondary | ICD-10-CM | POA: Diagnosis present

## 2019-06-20 NOTE — Patient Instructions (Signed)
Heat Therapy Heat therapy can help ease sore, stiff, injured, and tight muscles and joints. Heat relaxes your muscles, which may help ease your pain and muscle spasms. Do not use heat therapy unless your doctor tells you to use it. How to use heat therapy There are several different kinds of heat therapy, including:  Moist heat pack.  Hot water bottle.  Electric heating pad.  Heated gel pack.  Heated wrap.  Warm water bath. Your doctor will tell you how to use heat therapy. In general, you should: 1. Place a towel between your skin and the heat source. 2. Leave the heat on for 20-30 minutes. Your skin may turn pink. 3. Remove the heat if your skin turns bright red. You should remove the heat source if you are unable to feel pain, heat, or cold. You are more likely to get burned if you leave it on the skin for too long. Your doctor may also tell you to take a warm water bath. To do this: 1. Put a non-slip pad in the bathtub to prevent a fall. 2. Fill the bathtub with warm water. 3. Check the water temperature. 4. Soak in the water for 15-20 minutes, or as told by your doctor. 5. Be careful when you stand up after the bath. You may feel dizzy. 6. Pat yourself dry after the bath. Do not rub your skin to dry it. General recommendations for heat therapy  Do not sleep while using heat therapy. Only use heat therapy while you are awake.  Your skin may turn pink while using heat therapy. Do not use heat therapy if your skin turns red.  Do not use heat therapy if you have a new injury.  High heat or using heat for a long time can cause burns. Be careful not to burn your skin when using heat therapy.  Do not use heat therapy on areas of your skin that are already irritated, such as with a rash or sunburn. Get help if you have:  Blisters, redness, swelling (puffiness), or numbness.  New pain.  Pain that is getting worse. Summary  Heat therapy is the use of heat to help ease sore,  stiff, injured, and tight muscles and joints.  There are different types of heat therapy. Your doctor will tell you which one to use.  Only use heat therapy while you are awake.  Watch your skin to make sure you do not get burned while using heat therapy. This information is not intended to replace advice given to you by your health care provider. Make sure you discuss any questions you have with your health care provider. Document Released: 02/12/2012 Document Revised: 01/13/2019 Document Reviewed: 12/01/2017 Elsevier Patient Education  2020 [place]: Flexion On Table   Place hands on towel placed on table, elbows straight. Lean forward with you upper body, pushing towel away from body.  ___ reps per set, ___ sets per day  Abduction (Passive)   With arm out to side, resting on towel placed on table with palm DOWN, keeping trunk away from table, lean to the side while pushing towel away from body.  Repeat ____ times. Do ____ sessions per day.  Copyright  VHI. All rights reserved.     Internal Rotation (Assistive)   Seated with elbow bent at right angle and held against side, slide arm on table surface in an inward arc keeping elbow anchored in place. Repeat ____ times. Do ____ sessions per day. Activity: Use this motion  to brush crumbs off the table.  Copyright  VHI. All rights reserved.

## 2019-06-20 NOTE — Therapy (Signed)
[name], [place], [address] Phone: [phone]   Fax:  [phone]  Occupational Therapy Evaluation  Patient Details  Name: [name] MRN: [id number] Date of Birth: 13-Nov-1926 Referring Provider (OT): Dr. Criss Rosales   Encounter Date: 06/20/2019  OT End of Session - 06/20/19 1340    Visit Number  1    Number of Visits  8    Date for OT Re-Evaluation  07/20/19    Authorization Type  [employer] HMO    Authorization Time Period  pending authorization    OT Start Time  1100    OT Stop Time  1135    OT Time Calculation (min)  35 min    Activity Tolerance  Patient tolerated treatment well    Behavior During Therapy  [facility] for tasks assessed/performed       Past Medical History:  Diagnosis Date  . Arthritis   . Cancer ([place])   . Depression   . Diabetes mellitus without complication ([place])   . Femoral-femoral bypass graft thrombosis, left ([place])   . HTN (hypertension)   . Hyperlipidemia   . Hypothyroidism   . Peripheral vascular disease ([place])   . Rotator cuff insufficiency 03/23/2011  . Thyroid disease     Past Surgical History:  Procedure Laterality Date  . ABDOMINAL HYSTERECTOMY    . APPENDECTOMY    . CARPAL TUNNEL RELEASE     right side  . CATARACT EXTRACTION W/PHACO Right 06/05/2013   Procedure: CATARACT EXTRACTION PHACO AND INTRAOCULAR LENS PLACEMENT (IOC);  Surgeon: Tonny Branch, MD;  Location: [facility];  Service: Ophthalmology;  Laterality: Right;  CDE: 10.58  . CATARACT EXTRACTION W/PHACO Left 07/10/2013   Procedure: CATARACT EXTRACTION PHACO AND INTRAOCULAR LENS PLACEMENT (IOC);  Surgeon: Tonny Branch, MD;  Location: [facility];  Service: Ophthalmology;  Laterality: Left;  CDE: 17.35  . CHOLECYSTECTOMY    . COLON SURGERY     sigmoid colectomy complicated by anastomotic leak  . COLOSTOMY  transverse loop, 2006  . EYE SURGERY    . grafts of bilateral lower extremities    . left shoulder hemiarthroplasty     . TONSILLECTOMY    . VESICOVAGINAL FISTULA CLOSURE W/ TAH      There were no vitals filed for this visit.  Subjective Assessment - 06/20/19 1336    Subjective   S:  My shoulder has been hurting for a while.    Pertinent History  [name] states that her right shoulder has been hurting her for some time.  She reports constant low grade pain which increases with activity.  She has been referred to occupational therapy for evaluation and treatment.    Special Tests  FOTO 55% independent    Patient Stated Goals  I want to get rid of the pain.    Currently in Pain?  Yes    Pain Score  8     Pain Location  Shoulder    Pain Orientation  Right    Pain Descriptors / Indicators  Aching    Pain Type  Chronic pain    Pain Onset  More than a month ago    Aggravating Factors   4/10 at rest, increases to 8/10 with activity    Pain Relieving Factors  nothing    Effect of Pain on Daily Activities  moderate        [facility] OT Assessment - 06/20/19 0001      Assessment   Medical  Diagnosis  Right Shoulder Pain    Referring Provider (OT)  Dr. Criss Rosales    Onset Date/Surgical Date  --   chronic   Hand Dominance  Right    Prior Therapy  n/a      Precautions   Precautions  None      Restrictions   Weight Bearing Restrictions  No      Balance Screen   Has the patient fallen in the past 6 months  No    Has the patient had a decrease in activity level because of a fear of falling?   No    Is the patient reluctant to leave their home because of a fear of falling?   No      Home  Environment   Family/patient expects to be discharged to:  Private residence    Living Arrangements  Other relatives    Available Help at Discharge  Family      Prior Function   Level of Independence  Independent with household mobility with device;Independent with basic ADLs;Needs assistance with homemaking    Vocation  Retired    Leisure  Oceanographer, church, used to visit senior center regularly      ADL   ADL  comments  reports independence with BADLS with increased pain in right shoulder with all activities       Written Expression   Dominant Hand  Right      Vision - History   Baseline Vision  No visual deficits      Cognition   Overall Cognitive Status  Within Functional Limits for tasks assessed      Observation/Other Assessments   Focus on Therapeutic Outcomes (FOTO)   55% independent, 45% limited      Sensation   Light Touch  Appears Intact      Coordination   Gross Motor Movements are Fluid and Coordinated  Yes    Fine Motor Movements are Fluid and Coordinated  Yes      ROM / Strength   AROM / PROM / Strength  AROM;PROM;Strength      Palpation   Palpation comment  very tender to touch in shoulder joint, scapular region.  audible crepitis with right arm movement      AROM   Overall AROM Comments  assessed in seated    AROM Assessment Site  Shoulder    Right/Left Shoulder  Right    Right Shoulder Flexion  50 Degrees    Right Shoulder ABduction  50 Degrees    Right Shoulder Internal Rotation  70 Degrees    Right Shoulder External Rotation  20 Degrees      PROM   Overall PROM Comments  assessed in supine    PROM Assessment Site  Shoulder    Right/Left Shoulder  Right    Right Shoulder Flexion  75 Degrees    Right Shoulder ABduction  75 Degrees    Right Shoulder Internal Rotation  70 Degrees    Right Shoulder External Rotation  20 Degrees      Strength   Overall Strength Comments  assessed in seated, within given range     Strength Assessment Site  Shoulder    Right/Left Shoulder  Right    Right Shoulder Flexion  4-/5    Right Shoulder ABduction  4-/5    Right Shoulder Internal Rotation  4-/5    Right Shoulder External Rotation  4-/5  OT Treatments/Exercises (OP) - 06/20/19 0001      Modalities   Modalities  Moist Heat      Moist Heat Therapy   Number Minutes Moist Heat  10 Minutes    Moist Heat Location  Shoulder   right            OT Education - 06/20/19 1340    Education Details  educated patient on use of heat for pain management, towel slides    Person(s) Educated  Patient    Methods  Explanation;Demonstration;Handout    Comprehension  Verbalized understanding;Returned demonstration       OT Short Term Goals - 06/20/19 1352      OT SHORT TERM GOAL #1   Title  Patient will be educated on a HEP for improved shoulder mobility.    Time  4    Period  Weeks    Status  New    Target Date  07/20/19      OT SHORT TERM GOAL #2   Title  Patient will improve right shoulder a/rom by 25% for improved ability to complete daily tasks.    Time  4    Period  Weeks    Status  New      OT SHORT TERM GOAL #3   Title  Patient will improve strength to 4+/5 in given range for increased ability to lift pots and pans when cooking.    Time  4    Period  Weeks    Status  New      OT SHORT TERM GOAL #4   Title  Patient will decrease pain to 3/10 when completing daily tasks.    Time  4    Period  Weeks    Status  New               Plan - 06/20/19 1346    Clinical Impression Statement  A:  Patient is a 85 year old female with past medical history significant for arthritis, high blood pressure, shoulder pain, multiple surgeries, colostomy.  Patient states she has constant pain in her dominant right shoulder, which is limiting her ability to particpate in desired activities.  Patient's nephew states that patient was quite active until March of this year, visiting senior center weekly.  Due to [place] pandemic, she no longer is able to participate in activities at the senior center and has become sedentary.  Patient will benefit from skilled OT intervention to improve mobiilty and functional strength and decrease pain and stiffness in her right shoulder.    OT Occupational Profile and History  Problem Focused Assessment - Including review of records relating to presenting problem    Occupational performance  deficits (Please refer to evaluation for details):  ADL's;IADL's;Rest and Sleep;Leisure;Social Participation    Body Structure / Function / Physical Skills  ADL;Strength;Pain;GMC;UE functional use;IADL;ROM;Fascial restriction;Muscle spasms    Rehab Potential  Good    Clinical Decision Making  Limited treatment options, no task modification necessary    Comorbidities Affecting Occupational Performance:  None    Modification or Assistance to Complete Evaluation   No modification of tasks or assist necessary to complete eval    OT Frequency  2x / week    OT Duration  4 weeks    OT Treatment/Interventions  Self-care/ADL training;Moist Heat;DME and/or AE instruction;Therapeutic activities;Therapeutic exercise;Ultrasound;Cryotherapy;Neuromuscular education;Iontophoresis;Passive range of motion;Manual Therapy;Energy conservation;Electrical Stimulation;Patient/family education    Plan  P: Skilled OT intervention to decrease pain and fascial restrictions and improve pain  free mobility in her right shoulder region for improved particpation in daily tasks.    OT Home Exercise Plan  heat, towel slides    Consulted and Agree with Plan of Care  Patient       Patient will benefit from skilled therapeutic intervention in order to improve the following deficits and impairments:   Body Structure / Function / Physical Skills: ADL, Strength, Pain, GMC, UE functional use, IADL, ROM, Fascial restriction, Muscle spasms       Visit Diagnosis: 1. Stiffness of right shoulder, not elsewhere classified   2. Chronic right shoulder pain       Problem List Patient Active Problem List   Diagnosis Date Noted  . Pain in limb-Right Leg>Left 04/29/2014  . Peripheral vascular disease, unspecified ([place]) 09/04/2013  . Cervical stenosis of spine 05/29/2012  . Neck pain 05/29/2012  . Atherosclerosis of native arteries of the extremities with intermittent claudication 03/21/2012  . Colostomy complication-loop colostomy  10/2005 02/27/2012  . Rotator cuff insufficiency 03/23/2011  . SHOULDER PAIN 04/11/2010  . IMPINGEMENT SYNDROME 04/11/2010  . SHOULDER JOINT REPLACEMENT BY OTHER MEANS 04/11/2010  . OLD DISRUPTION OF POSTERIOR CRUCIATE LIGAMENT 04/06/2010  . DEGENERATIVE JOINT DISEASE, LEFT KNEE 12/25/2007  . KNEE PAIN 12/25/2007  . HIGH BLOOD PRESSURE 12/24/2007    Vangie Bicker, [place], OTR/L [phone]  06/20/2019, 2:00 PM  [facility] [address] [place], [place], [address] Phone: [phone]   Fax:  [phone]  Name: [name] MRN: [id number] Date of Birth: September 04, 1926

## 2019-06-23 ENCOUNTER — Other Ambulatory Visit: Payer: Self-pay

## 2019-06-23 ENCOUNTER — Inpatient Hospital Stay (HOSPITAL_COMMUNITY)
Admission: EM | Admit: 2019-06-23 | Discharge: 2019-07-05 | DRG: 177 | Disposition: E | Payer: Medicare HMO | Attending: Internal Medicine | Admitting: Internal Medicine

## 2019-06-23 ENCOUNTER — Emergency Department (HOSPITAL_COMMUNITY): Payer: Medicare HMO

## 2019-06-23 ENCOUNTER — Encounter (HOSPITAL_COMMUNITY): Payer: Self-pay

## 2019-06-23 ENCOUNTER — Telehealth (HOSPITAL_COMMUNITY): Payer: Self-pay | Admitting: Occupational Therapy

## 2019-06-23 DIAGNOSIS — Z79890 Hormone replacement therapy (postmenopausal): Secondary | ICD-10-CM | POA: Diagnosis not present

## 2019-06-23 DIAGNOSIS — Z515 Encounter for palliative care: Secondary | ICD-10-CM | POA: Diagnosis not present

## 2019-06-23 DIAGNOSIS — N184 Chronic kidney disease, stage 4 (severe): Secondary | ICD-10-CM | POA: Diagnosis present

## 2019-06-23 DIAGNOSIS — E039 Hypothyroidism, unspecified: Secondary | ICD-10-CM | POA: Diagnosis present

## 2019-06-23 DIAGNOSIS — J1289 Other viral pneumonia: Secondary | ICD-10-CM | POA: Diagnosis present

## 2019-06-23 DIAGNOSIS — E1151 Type 2 diabetes mellitus with diabetic peripheral angiopathy without gangrene: Secondary | ICD-10-CM | POA: Diagnosis present

## 2019-06-23 DIAGNOSIS — Z933 Colostomy status: Secondary | ICD-10-CM

## 2019-06-23 DIAGNOSIS — G8929 Other chronic pain: Secondary | ICD-10-CM | POA: Diagnosis present

## 2019-06-23 DIAGNOSIS — D649 Anemia, unspecified: Secondary | ICD-10-CM | POA: Diagnosis present

## 2019-06-23 DIAGNOSIS — E869 Volume depletion, unspecified: Secondary | ICD-10-CM | POA: Diagnosis present

## 2019-06-23 DIAGNOSIS — E872 Acidosis: Secondary | ICD-10-CM | POA: Diagnosis present

## 2019-06-23 DIAGNOSIS — Z66 Do not resuscitate: Secondary | ICD-10-CM | POA: Diagnosis not present

## 2019-06-23 DIAGNOSIS — R945 Abnormal results of liver function studies: Secondary | ICD-10-CM | POA: Diagnosis not present

## 2019-06-23 DIAGNOSIS — E1122 Type 2 diabetes mellitus with diabetic chronic kidney disease: Secondary | ICD-10-CM | POA: Diagnosis present

## 2019-06-23 DIAGNOSIS — D638 Anemia in other chronic diseases classified elsewhere: Secondary | ICD-10-CM | POA: Diagnosis present

## 2019-06-23 DIAGNOSIS — T380X5A Adverse effect of glucocorticoids and synthetic analogues, initial encounter: Secondary | ICD-10-CM | POA: Diagnosis present

## 2019-06-23 DIAGNOSIS — E1165 Type 2 diabetes mellitus with hyperglycemia: Secondary | ICD-10-CM | POA: Diagnosis not present

## 2019-06-23 DIAGNOSIS — N179 Acute kidney failure, unspecified: Secondary | ICD-10-CM | POA: Diagnosis present

## 2019-06-23 DIAGNOSIS — Z79899 Other long term (current) drug therapy: Secondary | ICD-10-CM

## 2019-06-23 DIAGNOSIS — I10 Essential (primary) hypertension: Secondary | ICD-10-CM | POA: Diagnosis not present

## 2019-06-23 DIAGNOSIS — F0390 Unspecified dementia without behavioral disturbance: Secondary | ICD-10-CM | POA: Diagnosis present

## 2019-06-23 DIAGNOSIS — R531 Weakness: Secondary | ICD-10-CM | POA: Diagnosis present

## 2019-06-23 DIAGNOSIS — E875 Hyperkalemia: Secondary | ICD-10-CM | POA: Diagnosis present

## 2019-06-23 DIAGNOSIS — E785 Hyperlipidemia, unspecified: Secondary | ICD-10-CM | POA: Diagnosis present

## 2019-06-23 DIAGNOSIS — I129 Hypertensive chronic kidney disease with stage 1 through stage 4 chronic kidney disease, or unspecified chronic kidney disease: Secondary | ICD-10-CM | POA: Diagnosis present

## 2019-06-23 DIAGNOSIS — E870 Hyperosmolality and hypernatremia: Secondary | ICD-10-CM | POA: Diagnosis present

## 2019-06-23 DIAGNOSIS — U071 COVID-19: Secondary | ICD-10-CM | POA: Diagnosis present

## 2019-06-23 DIAGNOSIS — J9601 Acute respiratory failure with hypoxia: Secondary | ICD-10-CM | POA: Diagnosis present

## 2019-06-23 DIAGNOSIS — Z90710 Acquired absence of both cervix and uterus: Secondary | ICD-10-CM

## 2019-06-23 DIAGNOSIS — J189 Pneumonia, unspecified organism: Secondary | ICD-10-CM | POA: Diagnosis present

## 2019-06-23 DIAGNOSIS — E43 Unspecified severe protein-calorie malnutrition: Secondary | ICD-10-CM | POA: Diagnosis present

## 2019-06-23 DIAGNOSIS — R0602 Shortness of breath: Secondary | ICD-10-CM

## 2019-06-23 DIAGNOSIS — Z9049 Acquired absence of other specified parts of digestive tract: Secondary | ICD-10-CM

## 2019-06-23 LAB — URINALYSIS, ROUTINE W REFLEX MICROSCOPIC
Bilirubin Urine: NEGATIVE
Glucose, UA: NEGATIVE mg/dL
Ketones, ur: NEGATIVE mg/dL
Leukocytes,Ua: NEGATIVE
Nitrite: NEGATIVE
Protein, ur: 30 mg/dL — AB
Specific Gravity, Urine: 1.015 (ref 1.005–1.030)
pH: 5 (ref 5.0–8.0)

## 2019-06-23 LAB — COMPREHENSIVE METABOLIC PANEL
ALT: 30 U/L (ref 0–44)
AST: 58 U/L — ABNORMAL HIGH (ref 15–41)
Albumin: 2.8 g/dL — ABNORMAL LOW (ref 3.5–5.0)
Alkaline Phosphatase: 61 U/L (ref 38–126)
Anion gap: 8 (ref 5–15)
BUN: 43 mg/dL — ABNORMAL HIGH (ref 8–23)
CO2: 19 mmol/L — ABNORMAL LOW (ref 22–32)
Calcium: 7.8 mg/dL — ABNORMAL LOW (ref 8.9–10.3)
Chloride: 113 mmol/L — ABNORMAL HIGH (ref 98–111)
Creatinine, Ser: 2.58 mg/dL — ABNORMAL HIGH (ref 0.44–1.00)
GFR calc Af Amer: 18 mL/min — ABNORMAL LOW (ref >60)
GFR calc non Af Amer: 16 mL/min — ABNORMAL LOW (ref >60)
Glucose, Bld: 149 mg/dL — ABNORMAL HIGH (ref 70–99)
Potassium: 5 mmol/L (ref 3.5–5.1)
Sodium: 140 mmol/L (ref 135–145)
Total Bilirubin: 0.4 mg/dL (ref 0.3–1.2)
Total Protein: 7.4 g/dL (ref 6.5–8.1)

## 2019-06-23 LAB — CBC
HCT: 34.4 % — ABNORMAL LOW (ref 36.0–46.0)
Hemoglobin: 10.1 g/dL — ABNORMAL LOW (ref 12.0–15.0)
MCH: 25.2 pg — ABNORMAL LOW (ref 26.0–34.0)
MCHC: 29.4 g/dL — ABNORMAL LOW (ref 30.0–36.0)
MCV: 85.8 fL (ref 80.0–100.0)
Platelets: 290 10*3/uL (ref 150–400)
RBC: 4.01 MIL/uL (ref 3.87–5.11)
RDW: 15.6 % — ABNORMAL HIGH (ref 11.5–15.5)
WBC: 7.4 10*3/uL (ref 4.0–10.5)
nRBC: 0 % (ref 0.0–0.2)

## 2019-06-23 LAB — CBG MONITORING, ED
Glucose-Capillary: 124 mg/dL — ABNORMAL HIGH (ref 70–99)
Glucose-Capillary: 127 mg/dL — ABNORMAL HIGH (ref 70–99)

## 2019-06-23 LAB — LIPASE, BLOOD: Lipase: 33 U/L (ref 11–51)

## 2019-06-23 LAB — FERRITIN: Ferritin: 164 ng/mL (ref 11–307)

## 2019-06-23 LAB — C-REACTIVE PROTEIN: CRP: 18.9 mg/dL — ABNORMAL HIGH (ref <1.0)

## 2019-06-23 LAB — D-DIMER, QUANTITATIVE: D-Dimer, Quant: 2.74 ug/mL-FEU — ABNORMAL HIGH (ref 0.00–0.50)

## 2019-06-23 LAB — LACTIC ACID, PLASMA: Lactic Acid, Venous: 0.9 mmol/L (ref 0.5–1.9)

## 2019-06-23 LAB — SARS CORONAVIRUS 2 BY RT PCR (HOSPITAL ORDER, PERFORMED IN ~~LOC~~ HOSPITAL LAB): SARS Coronavirus 2: POSITIVE — AB

## 2019-06-23 LAB — LACTATE DEHYDROGENASE: LDH: 343 U/L — ABNORMAL HIGH (ref 98–192)

## 2019-06-23 MED ORDER — PRO-STAT SUGAR FREE PO LIQD
30.0000 mL | Freq: Two times a day (BID) | ORAL | Status: DC
  Administered 2019-06-23 – 2019-06-26 (×5): 30 mL via ORAL
  Filled 2019-06-23 (×10): qty 30

## 2019-06-23 MED ORDER — SODIUM CHLORIDE 0.9 % IV SOLN
INTRAVENOUS | Status: AC
  Administered 2019-06-23: 22:00:00 via INTRAVENOUS

## 2019-06-23 MED ORDER — SODIUM CHLORIDE 0.9 % IV BOLUS
1000.0000 mL | Freq: Once | INTRAVENOUS | Status: AC
  Administered 2019-06-23: 1000 mL via INTRAVENOUS

## 2019-06-23 MED ORDER — AZITHROMYCIN 250 MG PO TABS
500.0000 mg | ORAL_TABLET | Freq: Once | ORAL | Status: AC
  Administered 2019-06-23: 500 mg via ORAL
  Filled 2019-06-23: qty 2

## 2019-06-23 MED ORDER — ENOXAPARIN SODIUM 30 MG/0.3ML ~~LOC~~ SOLN
30.0000 mg | SUBCUTANEOUS | Status: DC
  Administered 2019-06-23: 30 mg via SUBCUTANEOUS
  Filled 2019-06-23: qty 0.3

## 2019-06-23 MED ORDER — INSULIN ASPART 100 UNIT/ML ~~LOC~~ SOLN
0.0000 [IU] | SUBCUTANEOUS | Status: DC
  Administered 2019-06-23: 1 [IU] via SUBCUTANEOUS
  Administered 2019-06-24: 3 [IU] via SUBCUTANEOUS
  Administered 2019-06-24: 2 [IU] via SUBCUTANEOUS
  Administered 2019-06-24 (×2): 1 [IU] via SUBCUTANEOUS
  Filled 2019-06-23 (×3): qty 1

## 2019-06-23 MED ORDER — DEXAMETHASONE SODIUM PHOSPHATE 10 MG/ML IJ SOLN
6.0000 mg | INTRAMUSCULAR | Status: DC
  Administered 2019-06-23 – 2019-06-25 (×3): 6 mg via INTRAVENOUS
  Filled 2019-06-23 (×3): qty 1

## 2019-06-23 MED ORDER — SODIUM CHLORIDE 0.9 % IV SOLN
1.0000 g | Freq: Once | INTRAVENOUS | Status: AC
  Administered 2019-06-23: 1 g via INTRAVENOUS
  Filled 2019-06-23: qty 10

## 2019-06-23 NOTE — ED Notes (Signed)
CBG 127 

## 2019-06-23 NOTE — H&P (Signed)
TRH H&P    Patient Demographics:    [name], is a 85 y.o. female  MRN: [id number]  DOB - [birth year]  Admit Date - 06/25/2019  Referring MD/NP/PA:  Suella Broad  Outpatient Primary MD for the patient is Lucianne Lei, MD  Patient coming from: home  Chief complaint-       HPI:    [name]  is a 84 y.o. female, w hypertension, hyperlipidemia, Dm2, PVD, Hypothyroidism, Anemia,  presents with c/o generalized weakness and dysuria.  Pox 92%  -> 84% on RA,  In ED,  T 99.2 P 85 R 16, Bp 126/61 pox 92% Wt 81.6kg  Wbc 7.4, hgb 10.1, Plt 290 Na 140, K 5.0, Bun 43, Creatinine 2.58 Alb 2.8 Ast 58, Al 30, Alk phos 61, T. Bili 0.4 Glucose 149  CXR IMPRESSION: Patchy bilateral infiltrates.  Pt given rocephin iv, zithromax iv in the ED.   Pt will be admitted for pneumonia.  R/o COVID.         Review of systems:    In addition to the HPI above,  No Fever-chills, No Headache, No changes with Vision or hearing, No problems swallowing food or Liquids, No Chest pain, No Shortness of Breath, slight dry cough No Abdominal pain, No Nausea or Vomiting, bowel movements are regular, No Blood in stool or Urine, No dysuria, No new skin rashes or bruises, No new joints pains-aches,  No new weakness, tingling, numbness in any extremity, No recent weight gain or loss, No polyuria, polydypsia or polyphagia, No significant Mental Stressors.  All other systems reviewed and are negative.    Past History of the following :    Past Medical History:  Diagnosis Date   Arthritis    Cancer ([place])    Depression    Diabetes mellitus without complication ([place])    Femoral-femoral bypass graft thrombosis, left (HCC)    HTN (hypertension)    Hyperlipidemia    Hypothyroidism    Peripheral vascular disease ([place])    Rotator cuff insufficiency 03/23/2011   Thyroid disease       Past  Surgical History:  Procedure Laterality Date   ABDOMINAL HYSTERECTOMY     APPENDECTOMY     CARPAL TUNNEL RELEASE     right side   CATARACT EXTRACTION W/PHACO Right 06/05/2013   Procedure: CATARACT EXTRACTION PHACO AND INTRAOCULAR LENS PLACEMENT ([place]);  Surgeon: Tonny Branch, MD;  Location: [facility];  Service: Ophthalmology;  Laterality: Right;  CDE: 10.58   CATARACT EXTRACTION W/PHACO Left 07/10/2013   Procedure: CATARACT EXTRACTION PHACO AND INTRAOCULAR LENS PLACEMENT (I[facility]);  Surgeon: Tonny Branch, MD;  Location: [facility];  Service: Ophthalmology;  Laterality: Left;  CDE: 17.35   CHOLECYSTECTOMY     COLON SURGERY     sigmoid colectomy complicated by anastomotic leak   COLOSTOMY  transverse loop, 2006   EYE SURGERY     grafts of bilateral lower extremities     left shoulder hemiarthroplasty     TONSILLECTOMY     VESICOVAGINAL FISTULA CLOSURE W/ TAH  Social History:      Social History   Tobacco Use   Smoking status: Never Smoker   Smokeless tobacco: Never Used  Substance Use Topics   Alcohol use: No    Alcohol/week: 0.0 standard drinks       Family History :     Family History  Problem Relation Age of Onset   Stroke Mother    Diabetes Mother        Leg amputation   Hypertension Mother    Heart disease Father    Heart attack Father    Cancer Daughter        Home Medications:   Prior to Admission medications   Medication Sig Start Date End Date Taking? Authorizing Provider  amLODipine (NORVASC) 10 MG tablet Take 10 mg by mouth daily.    [provider]  gabapentin (NEURONTIN) 100 MG capsule Take 1 capsule (100 mg total) by mouth 3 (three) times daily. Patient not taking: Reported on 06/20/2019 06/04/19   Carole Civil, MD  HYDROcodone-acetaminophen [facility]) 7.5-325 MG tablet Take 1 tablet by mouth every 4 (four) hours as needed for up to 60 doses. 06/04/19   Carole Civil, MD     Allergies:    No Known Allergies   Physical  Exam:   Vitals  Blood pressure (!) 128/52, pulse 83, temperature 99.2 F (37.3 C), temperature source Oral, resp. rate 18, height 5' (1.524 m), weight 81.6 kg, SpO2 (!) 84 %.  1.  General: aoxo3  2. Psychiatric: euthymic  3. Neurologic: cn2-12 intact, reflexes 2+ symmetric, diffuse with no clonus,  Motor 5/5 in all 4 ext  4. HEENMT:  Anicteric, pupils 1.40m symmetric, direct, consensual, near intact Neck:no jvd, no bruit  5. Respiratory : + bibasilar crackles, no wheezing  6. Cardiovascular : rrr s1, s2, 1/6 sem rusb  7. Gastrointestinal:  Abd: soft, nt, nd, +bs  8. Skin:  Ext: no c/c/e,  No rash  9.Musculoskeletal:  Good ROM  ,   No adenopathy    Data Review:    CBC Recent Labs  Lab 06/17/2019 1732  WBC 7.4  HGB 10.1*  HCT 34.4*  PLT 290  MCV 85.8  MCH 25.2*  MCHC 29.4*  RDW 15.6*   ------------------------------------------------------------------------------------------------------------------  Results for orders placed or performed during the hospital encounter of 06/21/2019 (from the past 48 hour(s))  CBG monitoring, ED     Status: Abnormal   Collection Time: 06/14/2019  4:16 PM  Result Value Ref Range   Glucose-Capillary 127 (H) 70 - 99 mg/dL  Lipase, blood     Status: None   Collection Time: 06/28/2019  5:32 PM  Result Value Ref Range   Lipase 33 11 - 51 U/L    Comment: [facility] [address], [place] [id number] Comprehensive metabolic panel     Status: Abnormal   Collection Time: 06/09/2019  5:32 PM  Result Value Ref Range   Sodium 140 135 - 145 mmol/L   Potassium 5.0 3.5 - 5.1 mmol/L   Chloride 113 (H) 98 - 111 mmol/L   CO2 19 (L) 22 - 32 mmol/L   Glucose, Bld 149 (H) 70 - 99 mg/dL   BUN 43 (H) 8 - 23 mg/dL   Creatinine, Ser 2.58 (H) 0.44 - 1.00 mg/dL   Calcium 7.8 (L) 8.9 - 10.3 mg/dL   Total Protein 7.4 6.5 - 8.1 g/dL   Albumin 2.8 (L) 3.5 - 5.0 g/dL   AST 58 (  H) 15 - 41 U/L   ALT 30 0 - 44 U/L   Alkaline  Phosphatase 61 38 - 126 U/L   Total Bilirubin 0.4 0.3 - 1.2 mg/dL   GFR calc non Af Amer 16 (L) >60 mL/min   GFR calc Af Amer 18 (L) >60 mL/min   Anion gap 8 5 - 15    Comment: Performed at [facility], [address], [place],  [address]  CBC     Status: Abnormal   Collection Time: 06/13/2019  5:32 PM  Result Value Ref Range   WBC 7.4 4.0 - 10.5 K/uL   RBC 4.01 3.87 - 5.11 MIL/uL   Hemoglobin 10.1 (L) 12.0 - 15.0 g/dL   HCT 34.4 (L) 36.0 - 46.0 %   MCV 85.8 80.0 - 100.0 fL   MCH 25.2 (L) 26.0 - 34.0 pg   MCHC 29.4 (L) 30.0 - 36.0 g/dL   RDW 15.6 (H) 11.5 - 15.5 %   Platelets 290 150 - 400 K/uL   nRBC 0.0 0.0 - 0.2 %    Comment: Performed at [facility], [address], [place], [place] [address]  Urinalysis, Routine w reflex microscopic     Status: Abnormal   Collection Time: 06/05/2019  8:20 PM  Result Value Ref Range   Color, Urine YELLOW YELLOW   APPearance HAZY (A) CLEAR   Specific Gravity, Urine 1.015 1.005 - 1.030   pH 5.0 5.0 - 8.0   Glucose, UA NEGATIVE NEGATIVE mg/dL   Hgb urine dipstick SMALL (A) NEGATIVE   Bilirubin Urine NEGATIVE NEGATIVE   Ketones, ur NEGATIVE NEGATIVE mg/dL   Protein, ur 30 (A) NEGATIVE mg/dL   Nitrite NEGATIVE NEGATIVE   Leukocytes,Ua NEGATIVE NEGATIVE   RBC / HPF 0-5 0 - 5 RBC/hpf   WBC, UA 0-5 0 - 5 WBC/hpf   Bacteria, UA RARE (A) NONE SEEN   Squamous Epithelial / LPF 0-5 0 - 5    Comment: Performed at [facility], [address] [place],  [address]    Chemistries  Recent Labs  Lab 06/25/2019 1732  NA 140  K 5.0  CL 113*  CO2 19*  GLUCOSE 149*  BUN 43*  CREATININE 2.58*  CALCIUM 7.8*  AST 58*  ALT 30  ALKPHOS 61  BILITOT 0.4   ------------------------------------------------------------------------------------------------------------------  ------------------------------------------------------------------------------------------------------------------ GFR: Estimated Creatinine Clearance: 13.4 mL/min (A) (by  C-G formula based on SCr of 2.58 mg/dL (H)). Liver Function Tests: Recent Labs  Lab 06/09/2019 1732  AST 58*  ALT 30  ALKPHOS 61  BILITOT 0.4  PROT 7.4  ALBUMIN 2.8*   Recent Labs  Lab 06/28/2019 1732  LIPASE 33   No results for input(s): AMMONIA in the last 168 hours. Coagulation Profile: No results for input(s): INR, PROTIME in the last 168 hours. Cardiac Enzymes: No results for input(s): CKTOTAL, CKMB, CKMBINDEX, TROPONINI in the last 168 hours. BNP (last 3 results) No results for input(s): PROBNP in the last 8760 hours. HbA1C: No results for input(s): HGBA1C in the last 72 hours. CBG: Recent Labs  Lab 06/22/2019 1616  GLUCAP 127*   Lipid Profile: No results for input(s): CHOL, HDL, LDLCALC, TRIG, CHOLHDL, LDLDIRECT in the last 72 hours. Thyroid Function Tests: No results for input(s): TSH, T4TOTAL, FREET4, T3FREE, THYROIDAB in the last 72 hours. Anemia Panel: No results for input(s): VITAMINB12, FOLATE, FERRITIN, TIBC, IRON, RETICCTPCT in the last 72 hours.  --------------------------------------------------------------------------------------------------------------- Urine analysis:    Component Value Date/Time   COLORURINE YELLOW 06/14/2019 2020  APPEARANCEUR HAZY (A) 07/04/2019 2020   LABSPEC 1.015 06/08/2019 2020   PHURINE 5.0 06/21/2019 2020   GLUCOSEU NEGATIVE 06/14/2019 2020   HGBUR SMALL (A) 06/06/2019 2020   BILIRUBINUR NEGATIVE 07/04/2019 2020   KETONESUR NEGATIVE 07/04/2019 2020   PROTEINUR 30 (A) 07/03/2019 2020   UROBILINOGEN 0.2 02/27/2009 1705   NITRITE NEGATIVE 06/24/2019 2020   LEUKOCYTESUR NEGATIVE 06/22/2019 2020      Imaging Results:    Dg Chest Port 1 View  Result Date: 06/30/2019 CLINICAL DATA:  Weakness EXAM: PORTABLE CHEST 1 VIEW COMPARISON:  04/02/2014 FINDINGS: Cardiac shadow is near the upper limits of normal in size. Aortic calcifications are noted. Patchy infiltrative changes are noted in the mid and lower left lung and right  lung base new from the prior exam. No acute bony abnormality is seen. IMPRESSION: Patchy bilateral infiltrates. Electronically Signed   By: Inez Catalina M.D.   On: 06/07/2019 19:38   ekg nsr at 90, nl axis, no st-t changes c/w ischemia   Assessment & Plan:    Principal Problem:   Community acquired pneumonia Active Problems:   Abnormal liver function   Protein-calorie malnutrition, severe (HCC)   Acute renal failure superimposed on stage 4 chronic kidney disease (HCC)   Anemia  Acute respiratory failure with hypoxia,  Pneumonia (CAP) vs Covid   Pneumonia Blood culture x2 Urine strep antigen Urine legionella antigen Rocephin 67m iv qday, Zithromax 5070miv qday  Covid 19 positive infection Check esr, crp, LDH, ferritin, IL6, cpk Dexamethasone 66m12mv qday Prone patient D/w family they want her to be FULL CODE   ARF on CKD stage 4 Hydrate with ns iv Check cmp in am  Anemia Check cbc in am  Abnormal liver function Check cpk Check acute hepatitis panel RUQ ultrasound Check cmp in am  Severe protein calorie malnutrition Pro-stat 69m27m bid  Chronic pain Cont Norco Cont Gabapentin  DVT Prophylaxis-   Lovenox - SCDs   AM Labs Ordered, also please review Full Orders  Family Communication: Admission, patients condition and plan of care including tests being ordered have been discussed with the patient who indicate understanding and agree with the plan and Code Status.  Code Status:  FULL CODE, notified Niece that pt will be admitted to hospital for pneumonia.   Admission status: inpatient : Based on patients clinical presentation and evaluation of above clinical data, I have made determination that patient meets inpatient criteria at this time. Pt has high risk of clinical deterioration,  Pt will require iv abx, and iv decadron,   And require > 2 nites stay.     Time spent in minutes : 70   JameJani Gravel on 06/22/2019 at 9:02 PM

## 2019-06-23 NOTE — Telephone Encounter (Signed)
Niece came by to cx these appointments she is her caregiver and just took her to the ED.

## 2019-06-23 NOTE — ED Triage Notes (Signed)
Pt states she is 'just sick' and 'weak', denies n/v/d, headache, and abd pain.

## 2019-06-23 NOTE — ED Provider Notes (Signed)
[facility] EMERGENCY DEPARTMENT Provider Note   CSN: [id number] Arrival date & time: 06/24/2019  1551    History   Chief Complaint Chief Complaint  Patient presents with  . Weakness    HPI [name] is a 85 y.o. female.     86 year old female with past medical history of diabetes, hypertension, hyperlipidemia presents with complaint of generalized weakness for the past week with dysuria.  Patient is a poor historian, denies any other complaints or concerns.     Past Medical History:  Diagnosis Date  . Arthritis   . Cancer ([place])   . Depression   . Diabetes mellitus without complication ([place])   . Femoral-femoral bypass graft thrombosis, left ([place])   . HTN (hypertension)   . Hyperlipidemia   . Hypothyroidism   . Peripheral vascular disease ([place])   . Rotator cuff insufficiency 03/23/2011  . Thyroid disease     Patient Active Problem List   Diagnosis Date Noted  . Community acquired pneumonia 06/20/2019  . Abnormal liver function 06/18/2019  . Protein-calorie malnutrition, severe ([place]) 07/01/2019  . Acute renal failure superimposed on stage 4 chronic kidney disease ([place]) 06/07/2019  . Anemia 06/17/2019  . Pain in limb-Right Leg>Left 04/29/2014  . Peripheral vascular disease, unspecified ([place]) 09/04/2013  . Cervical stenosis of spine 05/29/2012  . Neck pain 05/29/2012  . Atherosclerosis of native arteries of the extremities with intermittent claudication 03/21/2012  . Colostomy complication-loop colostomy 10/2005 02/27/2012  . Rotator cuff insufficiency 03/23/2011  . SHOULDER PAIN 04/11/2010  . IMPINGEMENT SYNDROME 04/11/2010  . SHOULDER JOINT REPLACEMENT BY OTHER MEANS 04/11/2010  . OLD DISRUPTION OF POSTERIOR CRUCIATE LIGAMENT 04/06/2010  . DEGENERATIVE JOINT DISEASE, LEFT KNEE 12/25/2007  . KNEE PAIN 12/25/2007  . HIGH BLOOD PRESSURE 12/24/2007    Past Surgical History:  Procedure Laterality Date  . ABDOMINAL HYSTERECTOMY    . APPENDECTOMY    .  CARPAL TUNNEL RELEASE     right side  . CATARACT EXTRACTION W/PHACO Right 06/05/2013   Procedure: CATARACT EXTRACTION PHACO AND INTRAOCULAR LENS PLACEMENT (IOC);  Surgeon: Tonny Branch, MD;  Location: [facility];  Service: Ophthalmology;  Laterality: Right;  CDE: 10.58  . CATARACT EXTRACTION W/PHACO Left 07/10/2013   Procedure: CATARACT EXTRACTION PHACO AND INTRAOCULAR LENS PLACEMENT (IOC);  Surgeon: Tonny Branch, MD;  Location: [facility];  Service: Ophthalmology;  Laterality: Left;  CDE: 17.35  . CHOLECYSTECTOMY    . COLON SURGERY     sigmoid colectomy complicated by anastomotic leak  . COLOSTOMY  transverse loop, 2006  . EYE SURGERY    . grafts of bilateral lower extremities    . left shoulder hemiarthroplasty    . TONSILLECTOMY    . VESICOVAGINAL FISTULA CLOSURE W/ TAH       OB History   No obstetric history on file.      Home Medications    Prior to Admission medications   Medication Sig Start Date End Date Taking? Authorizing Provider  amLODipine (NORVASC) 10 MG tablet Take 10 mg by mouth daily.   Yes [provider]  gabapentin (NEURONTIN) 100 MG capsule Take 1 capsule (100 mg total) by mouth 3 (three) times daily. 06/04/19  Yes Carole Civil, MD  levothyroxine (SYNTHROID) 75 MCG tablet Take 75 mcg by mouth daily before breakfast.   Yes [provider]  HYDROcodone-acetaminophen (NORCO) 7.5-325 MG tablet Take 1 tablet by mouth every 4 (four) hours as needed for up to 60 doses. 06/04/19   Carole Civil,  MD    Family History Family History  Problem Relation Age of Onset  . Stroke Mother   . Diabetes Mother        Leg amputation  . Hypertension Mother   . Heart disease Father   . Heart attack Father   . Cancer Daughter     Social History Social History   Tobacco Use  . Smoking status: Never Smoker  . Smokeless tobacco: Never Used  Substance Use Topics  . Alcohol use: No    Alcohol/week: 0.0 standard drinks  . Drug use: No     Allergies    Patient has no known allergies.   Review of Systems Review of Systems  Reason unable to perform ROS: Patient is a poor historian.  Constitutional: Negative for chills and fever.  Respiratory: Negative for shortness of breath.   Cardiovascular: Negative for chest pain.  Gastrointestinal: Negative for abdominal pain, constipation, diarrhea, nausea and vomiting.  Genitourinary: Positive for dysuria.  Skin: Negative for rash and wound.  Allergic/Immunologic: Positive for immunocompromised state.  Neurological: Positive for weakness.     Physical Exam Updated Vital Signs BP (!) 140/55   Pulse 86   Temp 99.2 F (37.3 C) (Oral)   Resp (!) 24   Ht 5' (1.524 m)   Wt 81.6 kg   SpO2 94%   BMI 35.15 kg/m   Physical Exam Vitals signs and nursing note reviewed.  Constitutional:      General: She is not in acute distress.    Appearance: Normal appearance. She is not diaphoretic.  HENT:     Head: Normocephalic and atraumatic.  Eyes:     Extraocular Movements: Extraocular movements intact.     Pupils: Pupils are equal, round, and reactive to light.  Cardiovascular:     Rate and Rhythm: Normal rate and regular rhythm.     Pulses: Normal pulses.     Heart sounds: Normal heart sounds.  Pulmonary:     Effort: Pulmonary effort is normal.     Breath sounds: Normal breath sounds.  Abdominal:     Tenderness: There is no abdominal tenderness.  Musculoskeletal:        General: No swelling, tenderness, deformity or signs of injury.     Right lower leg: No edema.     Left lower leg: No edema.  Skin:    General: Skin is warm.     Findings: No lesion or rash.  Neurological:     Mental Status: She is alert.     GCS: GCS eye subscore is 4. GCS verbal subscore is 4. GCS motor subscore is 6.     Sensory: Sensation is intact.     Motor: No weakness or pronator drift.     Comments: Alert to person and place, unsure how she got to the emergency room today.      ED Treatments / Results   Labs (all labs ordered are listed, but only abnormal results are displayed) Labs Reviewed  COMPREHENSIVE METABOLIC PANEL - Abnormal; Notable for the following components:      Result Value   Chloride 113 (*)    CO2 19 (*)    Glucose, Bld 149 (*)    BUN 43 (*)    Creatinine, Ser 2.58 (*)    Calcium 7.8 (*)    Albumin 2.8 (*)    AST 58 (*)    GFR calc non Af Amer 16 (*)    GFR calc Af Amer 18 (*)  All other components within normal limits  CBC - Abnormal; Notable for the following components:   Hemoglobin 10.1 (*)    HCT 34.4 (*)    MCH 25.2 (*)    MCHC 29.4 (*)    RDW 15.6 (*)    All other components within normal limits  URINALYSIS, ROUTINE W REFLEX MICROSCOPIC - Abnormal; Notable for the following components:   APPearance HAZY (*)    Hgb urine dipstick SMALL (*)    Protein, ur 30 (*)    Bacteria, UA RARE (*)    All other components within normal limits  CBG MONITORING, ED - Abnormal; Notable for the following components:   Glucose-Capillary 127 (*)    All other components within normal limits  SARS CORONAVIRUS 2 (HOSPITAL ORDER, [facility])  LIPASE, BLOOD    EKG None  Radiology Dg Chest Port 1 View  Result Date: 06/11/2019 CLINICAL DATA:  Weakness EXAM: PORTABLE CHEST 1 VIEW COMPARISON:  04/02/2014 FINDINGS: Cardiac shadow is near the upper limits of normal in size. Aortic calcifications are noted. Patchy infiltrative changes are noted in the mid and lower left lung and right lung base new from the prior exam. No acute bony abnormality is seen. IMPRESSION: Patchy bilateral infiltrates. Electronically Signed   By: Inez Catalina M.D.   On: 07/01/2019 19:38    Procedures Procedures (including critical care time)  Medications Ordered in ED Medications  sodium chloride 0.9 % bolus 1,000 mL (0 mLs Intravenous Stopped 06/09/2019 2059)  cefTRIAXone (ROCEPHIN) 1 g in sodium chloride 0.9 % 100 mL IVPB (1 g Intravenous New Bag/Given 06/28/2019 2113)   azithromycin (ZITHROMAX) tablet 500 mg (500 mg Oral Given 06/19/2019 2114)     Initial Impression / Assessment and Plan / ED Course  I have reviewed the triage vital signs and the nursing notes.  Pertinent labs & imaging results that were available during my care of the patient were reviewed by me and considered in my medical decision making (see chart for details).  Clinical Course as of Jun 22 2144  Mon Jun 22, 1240  1872 85 year old female presents the emergency room with complaint of generalized weakness and dysuria for the past week.  On triage patient's room air O2 is 92%, when she was rechecked upon arrival to the room her room air oxygenation was 84%, patient is not on home O2, denies feeling short of breath or having any chest pain.   [LM]  2122 O2 improved when placed on nasal cannula, chest x-ray shows bilateral patchy infiltrates.  Urinalysis with protein and rare bacteria, leukocyte nitrate negative.  CMP with worsening creatinine function, currently 2.58, previously 1.99 at last check on file in 2017.  Patient was given Rocephin and Zithromax for her pneumonia.  COVID testing pending.  Case discussed with Dr. Maudie Mercury with hospitalist service who will consult for admission and disposition.   [LM]    Clinical Course User Index [LM] Tacy Learn, PA-C      Final Clinical Impressions(s) / ED Diagnoses   Final diagnoses:  Community acquired pneumonia, unspecified laterality  Weakness    ED Discharge Orders    None       Roque Lias 06/15/2019 2145    Nat Christen, MD 06/24/19 [phone]

## 2019-06-24 ENCOUNTER — Inpatient Hospital Stay (HOSPITAL_COMMUNITY): Payer: Medicare HMO

## 2019-06-24 ENCOUNTER — Ambulatory Visit (HOSPITAL_COMMUNITY): Payer: Medicare HMO

## 2019-06-24 ENCOUNTER — Encounter (HOSPITAL_COMMUNITY): Payer: Self-pay

## 2019-06-24 DIAGNOSIS — N179 Acute kidney failure, unspecified: Secondary | ICD-10-CM | POA: Diagnosis not present

## 2019-06-24 DIAGNOSIS — E43 Unspecified severe protein-calorie malnutrition: Secondary | ICD-10-CM | POA: Diagnosis not present

## 2019-06-24 DIAGNOSIS — Z515 Encounter for palliative care: Secondary | ICD-10-CM | POA: Diagnosis not present

## 2019-06-24 DIAGNOSIS — U071 COVID-19: Principal | ICD-10-CM

## 2019-06-24 DIAGNOSIS — E872 Acidosis: Secondary | ICD-10-CM | POA: Diagnosis not present

## 2019-06-24 DIAGNOSIS — J1289 Other viral pneumonia: Secondary | ICD-10-CM | POA: Diagnosis not present

## 2019-06-24 DIAGNOSIS — E870 Hyperosmolality and hypernatremia: Secondary | ICD-10-CM | POA: Diagnosis not present

## 2019-06-24 DIAGNOSIS — J189 Pneumonia, unspecified organism: Secondary | ICD-10-CM

## 2019-06-24 DIAGNOSIS — J9601 Acute respiratory failure with hypoxia: Secondary | ICD-10-CM

## 2019-06-24 DIAGNOSIS — N184 Chronic kidney disease, stage 4 (severe): Secondary | ICD-10-CM | POA: Diagnosis not present

## 2019-06-24 LAB — CBC WITH DIFFERENTIAL/PLATELET
Abs Immature Granulocytes: 0.06 10*3/uL (ref 0.00–0.07)
Basophils Absolute: 0 10*3/uL (ref 0.0–0.1)
Basophils Relative: 0 %
Eosinophils Absolute: 0 10*3/uL (ref 0.0–0.5)
Eosinophils Relative: 0 %
HCT: 32.1 % — ABNORMAL LOW (ref 36.0–46.0)
Hemoglobin: 9.5 g/dL — ABNORMAL LOW (ref 12.0–15.0)
Immature Granulocytes: 1 %
Lymphocytes Relative: 8 %
Lymphs Abs: 0.4 10*3/uL — ABNORMAL LOW (ref 0.7–4.0)
MCH: 25.2 pg — ABNORMAL LOW (ref 26.0–34.0)
MCHC: 29.6 g/dL — ABNORMAL LOW (ref 30.0–36.0)
MCV: 85.1 fL (ref 80.0–100.0)
Monocytes Absolute: 0.1 10*3/uL (ref 0.1–1.0)
Monocytes Relative: 2 %
Neutro Abs: 5 10*3/uL (ref 1.7–7.7)
Neutrophils Relative %: 89 %
Platelets: 293 10*3/uL (ref 150–400)
RBC: 3.77 MIL/uL — ABNORMAL LOW (ref 3.87–5.11)
RDW: 15.2 % (ref 11.5–15.5)
WBC: 5.5 10*3/uL (ref 4.0–10.5)
nRBC: 0 % (ref 0.0–0.2)

## 2019-06-24 LAB — CBG MONITORING, ED
Glucose-Capillary: 162 mg/dL — ABNORMAL HIGH (ref 70–99)
Glucose-Capillary: 146 mg/dL — ABNORMAL HIGH (ref 70–99)

## 2019-06-24 LAB — ABO/RH: ABO/RH(D): B POS

## 2019-06-24 LAB — GLUCOSE, CAPILLARY
Glucose-Capillary: 150 mg/dL — ABNORMAL HIGH (ref 70–99)
Glucose-Capillary: 214 mg/dL — ABNORMAL HIGH (ref 70–99)
Glucose-Capillary: 238 mg/dL — ABNORMAL HIGH (ref 70–99)

## 2019-06-24 LAB — COMPREHENSIVE METABOLIC PANEL
ALT: 25 U/L (ref 0–44)
AST: 44 U/L — ABNORMAL HIGH (ref 15–41)
Albumin: 2.5 g/dL — ABNORMAL LOW (ref 3.5–5.0)
Alkaline Phosphatase: 54 U/L (ref 38–126)
Anion gap: 10 (ref 5–15)
BUN: 40 mg/dL — ABNORMAL HIGH (ref 8–23)
CO2: 13 mmol/L — ABNORMAL LOW (ref 22–32)
Calcium: 7.6 mg/dL — ABNORMAL LOW (ref 8.9–10.3)
Chloride: 117 mmol/L — ABNORMAL HIGH (ref 98–111)
Creatinine, Ser: 2.08 mg/dL — ABNORMAL HIGH (ref 0.44–1.00)
GFR calc Af Amer: 24 mL/min — ABNORMAL LOW (ref >60)
GFR calc non Af Amer: 20 mL/min — ABNORMAL LOW (ref >60)
Glucose, Bld: 181 mg/dL — ABNORMAL HIGH (ref 70–99)
Potassium: 5.3 mmol/L — ABNORMAL HIGH (ref 3.5–5.1)
Sodium: 140 mmol/L (ref 135–145)
Total Bilirubin: 0.4 mg/dL (ref 0.3–1.2)
Total Protein: 6.5 g/dL (ref 6.5–8.1)

## 2019-06-24 LAB — PROCALCITONIN: Procalcitonin: 0.3 ng/mL

## 2019-06-24 LAB — CK TOTAL AND CKMB (NOT AT ARMC)
CK, MB: 2.5 ng/mL (ref 0.5–5.0)
Relative Index: 0.6 (ref 0.0–2.5)
Total CK: 395 U/L — ABNORMAL HIGH (ref 38–234)

## 2019-06-24 LAB — SEDIMENTATION RATE: Sed Rate: 73 mm/hr — ABNORMAL HIGH (ref 0–22)

## 2019-06-24 LAB — STREP PNEUMONIAE URINARY ANTIGEN: Strep Pneumo Urinary Antigen: NEGATIVE

## 2019-06-24 MED ORDER — AMLODIPINE BESYLATE 5 MG PO TABS
10.0000 mg | ORAL_TABLET | Freq: Every day | ORAL | Status: DC
  Administered 2019-06-24 – 2019-06-26 (×3): 10 mg via ORAL
  Filled 2019-06-24 (×3): qty 2

## 2019-06-24 MED ORDER — INSULIN ASPART 100 UNIT/ML ~~LOC~~ SOLN
0.0000 [IU] | Freq: Three times a day (TID) | SUBCUTANEOUS | Status: DC
  Administered 2019-06-24: 3 [IU] via SUBCUTANEOUS
  Administered 2019-06-25: 2 [IU] via SUBCUTANEOUS
  Administered 2019-06-25: 5 [IU] via SUBCUTANEOUS
  Administered 2019-06-25: 3 [IU] via SUBCUTANEOUS
  Administered 2019-06-25: 2 [IU] via SUBCUTANEOUS
  Administered 2019-06-26: 1 [IU] via SUBCUTANEOUS
  Administered 2019-06-26: 3 [IU] via SUBCUTANEOUS

## 2019-06-24 MED ORDER — SODIUM CHLORIDE 0.9 % IV SOLN
1.0000 g | INTRAVENOUS | Status: DC
  Administered 2019-06-24 – 2019-06-25 (×2): 1 g via INTRAVENOUS
  Filled 2019-06-24 (×2): qty 10

## 2019-06-24 MED ORDER — SODIUM CHLORIDE 0.9 % IV SOLN
INTRAVENOUS | Status: AC
  Administered 2019-06-24: 23:00:00 via INTRAVENOUS

## 2019-06-24 MED ORDER — LEVOTHYROXINE SODIUM 75 MCG PO TABS
75.0000 ug | ORAL_TABLET | Freq: Every day | ORAL | Status: DC
  Administered 2019-06-25 – 2019-06-26 (×2): 75 ug via ORAL
  Filled 2019-06-24 (×2): qty 1

## 2019-06-24 MED ORDER — AZITHROMYCIN 250 MG PO TABS
500.0000 mg | ORAL_TABLET | Freq: Every day | ORAL | Status: DC
  Administered 2019-06-24 – 2019-06-25 (×2): 500 mg via ORAL
  Filled 2019-06-24 (×2): qty 2

## 2019-06-24 MED ORDER — ORAL CARE MOUTH RINSE
15.0000 mL | Freq: Two times a day (BID) | OROMUCOSAL | Status: DC
  Administered 2019-06-24 – 2019-06-26 (×4): 15 mL via OROMUCOSAL

## 2019-06-24 MED ORDER — GABAPENTIN 100 MG PO CAPS
100.0000 mg | ORAL_CAPSULE | Freq: Three times a day (TID) | ORAL | Status: DC
  Administered 2019-06-24 – 2019-06-26 (×6): 100 mg via ORAL
  Filled 2019-06-24 (×6): qty 1

## 2019-06-24 MED ORDER — HYDROCODONE-ACETAMINOPHEN 5-325 MG PO TABS: 1.0000 | ORAL_TABLET | ORAL | Status: DC | PRN

## 2019-06-24 NOTE — ED Notes (Signed)
[facility] here to pick up pt  

## 2019-06-24 NOTE — ED Notes (Signed)
Will begin antibiotics once pt's meal tray is here to cluster care.

## 2019-06-24 NOTE — Progress Notes (Signed)
PROGRESS NOTE  [name] [id number]:[id number] DOB: 06-03-1926 DOA: 07/04/2019 PCP: Lucianne Lei, MD  Brief History:  85 year old female with a history of dementia, diabetes mellitus, hypertension, hyperlipidemia, CKD stage IV, peripheral vascular disease, hypothyroidism presenting with dysuria and generalized weakness.  The patient is unable to provide any history secondary to her cognitive impairment.  All of this history is obtained from review of the medical record.  Attempts were made to contact patient's niece without any answer.  Nevertheless, the patient states that she " just feels sick and weak".  The patient denies any headache, chest pain, shortness breath, coughing, nausea, vomiting, diarrhea, abdominal pain.  She does complain of some intermittent dysuria.  In the emergency department, the patient had temperature of 99.27F, but she was hemodynamically stable.  Oxygen saturation was 84% on room air initially.  She was placed on 2 L nasal cannula with oxygen saturation 97-98%.  Chest x-ray showed bilateral patchy infiltrates.  BMP was essentially unremarkable.  CBC was essentially unremarkable with hemoglobin 10.1 which is at her baseline.  AST 58, ALT 30, alkaline phosphatase 61, total bilirubin 0.4.  In the emergency department, the patient was given ceftriaxone and azithromycin. SARS-CoV2 was positive.  The patient was admitted for further evaluation and treatment.  She was given dexamethasone.  Assessment/Plan: Acute respiratory failure with hypoxia secondary to COVID-19 disease -Presently stable on 2 L nasal cannula -Wean oxygen as tolerated back to room air -Personally reviewed chest x-ray--bilateral patchy infiltrates, right greater than left -Lactic acid 0.9 -LDH 343 -D-dimer 2.74 -CRP 18.9 -CPK 395 -ESR 93 -Check procalcitonin -Continue dexamethasone and remdesivir  Acute on chronic renal failure--CKD stage IV -Secondary to volume depletion -Baseline creatinine  1.7-1.9 -Judicious fluid hydration -Serum creatinine 2.58 at the time of presentation  Transaminasemia -due to COVID-19 disease -trend  Diet-controlled diabetes mellitus type 2 -Check hemoglobin A1c -Last hemoglobin A1c on record 01/07/2009--6.3 -NovoLog sliding scale pending CBGs and A1c -Anticipate elevated CBGs secondary to steroids  Essential hypertension -Holding amlodipine secondary to soft blood pressures  Hypothyroidism -Restart Synthroid  Hyperkalemia -mild -anticipate improvement with IVF -am BMP -keep on tele -personally reviewed EKG--no peaked T waves or prolonged QRS       Disposition Plan:   Transfer to [facility] Family Communication:   Attempted to call niece  Consultants:  none Code Status:  FULL DVT Prophylaxis:  [place] Lovenox   Procedures: As Listed in Progress Note Above  Antibiotics: Ceftriaxone 7/20>>> azithro 7/20>>>      Subjective: Patient denies fevers, chills, headache, chest pain, dyspnea, nausea, vomiting, diarrhea, abdominal pain, dysuria, hematuria, hematochezia, and melena.   Objective: Vitals:   06/24/19 0545 06/24/19 0600 06/24/19 0700 06/24/19 0800  BP:  (!) 109/33 (!) 103/39 (!) 108/40  Pulse: 81 77 76 69  Resp:   15 13  Temp:      TempSrc:      SpO2: 100% 97% 98% 96%  Weight:      Height:        Intake/Output Summary (Last 24 hours) at 06/24/2019 0810 Last data filed at 06/24/2019 0620 Gross per 24 hour  Intake 1550.36 ml  Output -  Net 1550.36 ml   Weight change:  Exam:   General:  Pt is alert, follows commands appropriately, not in acute distress  HEENT: No icterus, No thrush, No neck mass, [place]/AT  Cardiovascular: RRR, S1/S2, no rubs, no gallops  Respiratory: bilateral crackles, no wheeze  Abdomen: Soft/+BS,  non tender, non distended, no guarding  Extremities: No edema, No lymphangitis, No petechiae, No rashes, no synovitis   Data Reviewed: I have personally reviewed following labs and imaging studies  Basic Metabolic Panel: Recent Labs  Lab 06/14/2019 1732 06/24/19 0638  NA 140 140  K 5.0 5.3*  CL 113* 117*  CO2 19* 13*  GLUCOSE 149* 181*  BUN 43* 40*  CREATININE 2.58* 2.08*  CALCIUM 7.8* 7.6*   Liver Function Tests: Recent Labs  Lab 06/29/2019 1732 06/24/19 0638  AST 58* 44*  ALT 30 25  ALKPHOS 61 54  BILITOT 0.4 0.4  PROT 7.4 6.5  ALBUMIN 2.8* 2.5*   Recent Labs  Lab 07/02/2019 1732  LIPASE 33   No results for input(s): AMMONIA in the last 168 hours. Coagulation Profile: No results for input(s): INR, PROTIME in the last 168 hours. CBC: Recent Labs  Lab 06/19/2019 1732 06/24/19 0638  WBC 7.4 5.5  NEUTROABS  --  5.0  HGB 10.1* 9.5*  HCT 34.4* 32.1*  MCV 85.8 85.1  PLT 290 293   Cardiac Enzymes: Recent Labs  Lab 06/18/2019 2238  CKTOTAL 395*  CKMB PENDING   BNP: Invalid input(s): POCBNP CBG: Recent Labs  Lab 06/24/2019 1616 06/10/2019 2346 06/24/19 0445  GLUCAP 127* 124* 162*   HbA1C: No results for input(s): HGBA1C in the last 72 hours. Urine analysis:    Component Value Date/Time   COLORURINE YELLOW 06/28/2019 2020   APPEARANCEUR HAZY (A) 06/13/2019 2020   LABSPEC 1.015 06/11/2019 2020   PHURINE 5.0 06/14/2019 2020   GLUCOSEU NEGATIVE 06/25/2019 2020   HGBUR SMALL (A) 06/21/2019 2020   BILIRUBINUR NEGATIVE  2020   [place] 06/22/2019 2020   PROTEINUR 30 (A) 07/02/2019 2020   UROBILINOGEN 0.2 02/27/2009 1705   NITRITE NEGATIVE 06/21/2019 2020   LEUKOCYTESUR NEGATIVE 06/12/2019 2020   Sepsis Labs: '@[email or web address]' (procalcitonin:4,lacticidven:4) ) Recent Results (from the past 240 hour(s))  SARS Coronavirus 2 (CEPHEID- Performed in [place] hospital lab), Hosp Order     Status: Abnormal   Collection Time: 06/25/2019  9:15 PM   Specimen: Nasopharyngeal Swab  Result Value Ref Range Status   SARS Coronavirus 2 POSITIVE (A) NEGATIVE Final    Comment: RESULT CALLED TO, READ BACK BY AND VERIFIED WITH: B OAKLEY,RN '@[id number]'  06/21/2019  (NOTE) If result is NEGATIVE SARS-CoV-2 target nucleic acids are NOT DETECTED. The SARS-CoV-2 RNA is generally detectable in upper and lower  respiratory specimens during the acute phase of infection. The lowest  concentration of SARS-CoV-2 viral copies this assay can detect is 250  copies / mL. A negative result does not preclude SARS-CoV-2 infection  and should not be used as the sole basis for treatment or other  patient management decisions.  A negative result may occur with  improper specimen collection / handling, submission of specimen other  than nasopharyngeal swab, presence of viral mutation(s) within the  areas targeted by this assay, and inadequate number of viral copies  (<250 copies / mL). A negative result must be combined with clinical  observations, patient history, and epidemiological information. If result is POSITIVE SARS-CoV-2 target nucleic acids are DETECTED. The SARS-C oV-2 RNA is generally detectable in upper and lower  respiratory specimens during the acute phase of infection.  Positive  results are indicative of active infection with SARS-CoV-2.  Clinical  correlation with patient history and other diagnostic information is  necessary to determine patient infection status.  Positive results do  not rule out bacterial infection or  co-infection with other viruses. If result is PRESUMPTIVE POSTIVE SARS-CoV-2 nucleic acids MAY BE PRESENT.   A presumptive positive result was obtained on the submitted specimen  and confirmed on repeat testing.  While 2019 novel coronavirus  (SARS-CoV-2) nucleic acids may be present in the submitted sample  additional confirmatory testing may be necessary for epidemiological  and / or clinical management purposes  to differentiate between  SARS-CoV-2 and other Sarbecovirus currently known to infect humans.  If clinically indicated additional testing with an alternate test  methodology [phone]) is advised.  The SARS-CoV-2 RNA is  generally  detectable in upper and lower respiratory specimens during the acute  phase of infection. The expected result is Negative. Fact Sheet for Patients:  StrictlyIdeas.no Fact Sheet for Healthcare Providers: BankingDealers.co.za This test is not yet approved or cleared by the [place] FDA and has been authorized for detection and/or diagnosis of SARS-CoV-2 by FDA under an Emergency Use Authorization (EUA).  This EUA will remain in effect (meaning this test can be used) for the duration of the COVID-19 declaration under Section 564(b)(1) of the Act, 21 U.S.C. section 360bbb-3(b)(1), unless the authorization is terminated or revoked sooner. Performed at [facility], [address], [place], [place] [address]   Culture, blood (Routine X 2) w Reflex to ID Panel     Status: None (Preliminary result)   Collection Time: 06/05/2019 10:29 PM   Specimen: BLOOD  Result Value Ref Range Status   Specimen Description BLOOD LEFT ANTECUBITAL  Final   Special Requests   Final    BOTTLES DRAWN AEROBIC AND ANAEROBIC Blood Culture adequate volume   Culture   Final    NO GROWTH < 12 HOURS Performed at [facility], [address] [place], [place] [address]    Report Status PENDING  Incomplete  Culture, blood (Routine X 2) w Reflex to ID Panel     Status: None (Preliminary result)   Collection Time: 07/03/2019 10:38 PM   Specimen: BLOOD  Result Value Ref Range Status   Specimen Description BLOOD LEFT ANTECUBITAL  Final   Special Requests   Final    BOTTLES DRAWN AEROBIC AND ANAEROBIC Blood Culture adequate volume   Culture   Final    NO GROWTH < 12 HOURS Performed at [facility], [address], [place], [place] [address]    Report Status PENDING  Incomplete     Scheduled Meds: . azithromycin  500 mg Oral Daily  . dexamethasone (DECADRON) injection  6 mg Intravenous Q24H  . enoxaparin (LOVENOX) injection  30 mg Subcutaneous Q24H  . feeding  supplement (PRO-STAT SUGAR FREE 64)  30 mL Oral BID  . insulin aspart  0-9 Units Subcutaneous Q4H   Continuous Infusions: . sodium chloride 75 mL/hr at 06/24/19 0620  . cefTRIAXone (ROCEPHIN)  IV      Procedures/Studies: Dg Chest Port 1 View  Result Date: 06/24/2019 CLINICAL DATA:  Weakness EXAM: PORTABLE CHEST 1 VIEW COMPARISON:  04/02/2014 FINDINGS: Cardiac shadow is near the upper limits of normal in size. Aortic calcifications are noted. Patchy infiltrative changes are noted in the mid and lower left lung and right lung base new from the prior exam. No acute bony abnormality is seen. IMPRESSION: Patchy bilateral infiltrates. Electronically Signed   By: Inez Catalina M.D.   On: 07/03/2019 19:38    Orson Eva, DO  [facility] Pager [phone]  If 7PM-7AM, please contact night-coverage [email or web address] Password [email or web address] 06/24/2019, 8:10 AM   LOS: 1 day

## 2019-06-24 NOTE — Progress Notes (Signed)
           PROGRESS NOTE  [name] RVA:[id number] DOB: 01-24-1926 DOA: 06/21/2019 PCP: Lucianne Lei, MD  Interim history:  Patient transferred today and likely has concurrent CAP (POA) already on azithromycin and ceftriaxone with borderline procalcitonin. Patient does not meet sepsis criteria.   [name], DO  [facility] Pager [phone]  If 7PM-7AM, please contact night-coverage [email or web address] Password [email or web address] 06/24/2019, 6:18 PM   LOS: 1 day

## 2019-06-24 NOTE — ED Notes (Signed)
Pt pulled off and out all lines. Gown changed. Tele attached. VSS. Colostomy emptied. New IV inserted.

## 2019-06-24 NOTE — Progress Notes (Signed)
Spoke with [place], pts niece. Updated her on status, answered all her questions, also told victoria that she would be the immediate contact for nurses and doctors.

## 2019-06-25 DIAGNOSIS — I10 Essential (primary) hypertension: Secondary | ICD-10-CM

## 2019-06-25 LAB — CBC
HCT: 31.4 % — ABNORMAL LOW (ref 36.0–46.0)
Hemoglobin: 9.5 g/dL — ABNORMAL LOW (ref 12.0–15.0)
MCH: 25.6 pg — ABNORMAL LOW (ref 26.0–34.0)
MCHC: 30.3 g/dL (ref 30.0–36.0)
MCV: 84.6 fL (ref 80.0–100.0)
Platelets: 339 10*3/uL (ref 150–400)
RBC: 3.71 MIL/uL — ABNORMAL LOW (ref 3.87–5.11)
RDW: 15.4 % (ref 11.5–15.5)
WBC: 11.6 10*3/uL — ABNORMAL HIGH (ref 4.0–10.5)
nRBC: 0.3 % — ABNORMAL HIGH (ref 0.0–0.2)

## 2019-06-25 LAB — C-REACTIVE PROTEIN: CRP: 11.3 mg/dL — ABNORMAL HIGH (ref <1.0)

## 2019-06-25 LAB — GLUCOSE, CAPILLARY
Glucose-Capillary: 191 mg/dL — ABNORMAL HIGH (ref 70–99)
Glucose-Capillary: 176 mg/dL — ABNORMAL HIGH (ref 70–99)
Glucose-Capillary: 181 mg/dL — ABNORMAL HIGH (ref 70–99)
Glucose-Capillary: 291 mg/dL — ABNORMAL HIGH (ref 70–99)
Glucose-Capillary: 241 mg/dL — ABNORMAL HIGH (ref 70–99)
Glucose-Capillary: 188 mg/dL — ABNORMAL HIGH (ref 70–99)

## 2019-06-25 LAB — COMPREHENSIVE METABOLIC PANEL
ALT: 23 U/L (ref 0–44)
AST: 43 U/L — ABNORMAL HIGH (ref 15–41)
Albumin: 2.3 g/dL — ABNORMAL LOW (ref 3.5–5.0)
Alkaline Phosphatase: 51 U/L (ref 38–126)
Anion gap: 9 (ref 5–15)
BUN: 41 mg/dL — ABNORMAL HIGH (ref 8–23)
CO2: 16 mmol/L — ABNORMAL LOW (ref 22–32)
Calcium: 7.7 mg/dL — ABNORMAL LOW (ref 8.9–10.3)
Chloride: 118 mmol/L — ABNORMAL HIGH (ref 98–111)
Creatinine, Ser: 1.77 mg/dL — ABNORMAL HIGH (ref 0.44–1.00)
GFR calc Af Amer: 29 mL/min — ABNORMAL LOW (ref >60)
GFR calc non Af Amer: 25 mL/min — ABNORMAL LOW (ref >60)
Glucose, Bld: 165 mg/dL — ABNORMAL HIGH (ref 70–99)
Potassium: 5.5 mmol/L — ABNORMAL HIGH (ref 3.5–5.1)
Sodium: 143 mmol/L (ref 135–145)
Total Bilirubin: 0.2 mg/dL — ABNORMAL LOW (ref 0.3–1.2)
Total Protein: 6.3 g/dL — ABNORMAL LOW (ref 6.5–8.1)

## 2019-06-25 LAB — HEPATITIS PANEL, ACUTE
HCV Ab: 0.2 s/co ratio (ref 0.0–0.9)
Hep A IgM: NEGATIVE
Hep B C IgM: NEGATIVE
Hepatitis B Surface Ag: NEGATIVE

## 2019-06-25 LAB — FERRITIN: Ferritin: 181 ng/mL (ref 11–307)

## 2019-06-25 LAB — INTERLEUKIN-6, PLASMA: Interleukin-6, Plasma: 70.7 pg/mL — ABNORMAL HIGH (ref 0.0–12.2)

## 2019-06-25 LAB — HEMOGLOBIN A1C
Hgb A1c MFr Bld: 7.1 % — ABNORMAL HIGH (ref 4.8–5.6)
Mean Plasma Glucose: 157.07 mg/dL

## 2019-06-25 MED ORDER — SODIUM CHLORIDE 0.9 % IV SOLN
200.0000 mg | Freq: Once | INTRAVENOUS | Status: AC
  Administered 2019-06-25: 200 mg via INTRAVENOUS
  Filled 2019-06-25: qty 40

## 2019-06-25 MED ORDER — SODIUM ZIRCONIUM CYCLOSILICATE 10 G PO PACK
10.0000 g | PACK | Freq: Every day | ORAL | Status: AC
  Administered 2019-06-25: 10 g via ORAL
  Filled 2019-06-25: qty 1

## 2019-06-25 MED ORDER — SODIUM CHLORIDE 0.9 % IV SOLN
100.0000 mg | INTRAVENOUS | Status: DC
  Administered 2019-06-26: 100 mg via INTRAVENOUS
  Filled 2019-06-25: qty 20

## 2019-06-25 MED ORDER — HEPARIN SODIUM (PORCINE) 5000 UNIT/ML IJ SOLN
5000.0000 [IU] | Freq: Three times a day (TID) | INTRAMUSCULAR | Status: DC
  Administered 2019-06-26: 5000 [IU] via SUBCUTANEOUS

## 2019-06-25 MED ORDER — HEPARIN SODIUM (PORCINE) 5000 UNIT/ML IJ SOLN
5000.0000 [IU] | Freq: Three times a day (TID) | INTRAMUSCULAR | Status: DC
  Administered 2019-06-25: 5000 [IU] via SUBCUTANEOUS
  Filled 2019-06-25: qty 1

## 2019-06-25 MED ORDER — ACETAMINOPHEN 325 MG PO TABS
650.0000 mg | ORAL_TABLET | Freq: Four times a day (QID) | ORAL | Status: DC | PRN
  Administered 2019-06-25: 650 mg via ORAL

## 2019-06-25 NOTE — Progress Notes (Addendum)
1933: Found patient restless in bed, pulled of monitor and O2. Pt tachypneic in 40s and SOB. HR in 120-130s and 62% on RA when plugged back in. Placed on NRB at [place]. Temp 100.3. SBP in 140s. Sats maintaining in low 80s on NRB. Physician on call notified at 16. Placed an order for prn tylenol and will reassess in an hour.   2100: Pt resting comfortably in bed. 94% on NRB. HR low 100s. Temp recheck 99.3. Placed patient on 15L HFNC and satting 90-92%. Physician on call updated.  0500: Patient appears much more comfortable this AM. Denies any pain and SOB. A/O X3. Slept well. Remains on HFNC at 13L.

## 2019-06-25 NOTE — Care Management Important Message (Signed)
Important Message  Patient Details  Name: LONNETTE SHRODE MRN: [id number] Date of Birth: 1926/01/22   Medicare Important Message Given:  Yes - Important Message mailed due to current National Emergency   Verbal consent obtained due to current National Emergency  Relationship to patient: Child Contact Name: Thurston Pounds Call Date: 06/25/19  Time: 1616 Phone: [phone] Outcome: Spoke with contact Important Message mailed to: Emergency contact on file     Tommy Medal 06/25/2019, 4:18 PM

## 2019-06-25 NOTE — Progress Notes (Signed)
MD suggested that we move the patient to the west side where its much quieter. We were about to move the patient to the west side, she became tearful and wanted to stay in same room. She is A&O during the conversation. Informed the charge if she gets anxious over night then we can move her. MD made aware.

## 2019-06-25 NOTE — Progress Notes (Addendum)
PROGRESS NOTE                                                                                                                                                                                                             Patient Demographics:    [name], is a 85 y.o. female, DOB - 04/05/1926, QHU:[id number]  Outpatient Primary MD for the patient is Lucianne Lei, MD    LOS - 2  Chief Complaint  Patient presents with   Weakness       Brief Narrative: Patient is a 85 y.o. female with PMHx of HTN, DM-2, CKD stage IV, hypothyroidism, PAD s/p left femoral-popliteal bypass in 2010, dementia-presented with generalized weakness-found to have acute hypoxic respiratory failure in the setting of COVID-19 viral pneumonia   Subjective:    [name] today claims to feel better-lying comfortably in bed.   Assessment  & Plan :   Acute hypoxic respiratory failure secondary to COVID-19 pneumonia: Appears comfortable-while I was in the room-I titrated down oxygen to 3.5 L (was on 5 L)-O2 sat remained stable in the low 90s.  Continue steroids-start Remdesivir.  Do not think patient has a bacterial infection-procalcitonin unremarkable-stop Rocephin and Zithromax.  Inflammatory markers are downtrending-follow clinical course.  COVID-19 Labs:  Recent Labs    06/05/2019 1739 06/29/2019 2238 06/25/19 0220  DDIMER  --  2.74*  --   FERRITIN 164  --  181  LDH  --  343*  --   CRP 18.9*  --  11.3*    Lab Results  Component Value Date   SARSCOV2NAA POSITIVE (A) 07/02/2019     COVID-19 Medications: 7/20>> Decadron 7/22>> Remdesivir   AKI on CKD stage IV: AKI likely hemodynamically mediated in the setting of COVID-19 pneumonia-improving with supportive care.  Mild hyperkalemia: We will give 1 dose of Lokelma-recheck electrolytes tomorrow.  Non-anion gap metabolic acidosis: Likely secondary to AKI-improving with supportive  care.  Follow.  Hypothyroidism: Continue Synthroid  HTN: BP stable-continue amlodipine  DM-2 (A1c 7.1 on 06/25/2019): Probably diet controlled at home-as not on any oral hypoglycemic agents at home-CBGs relatively stable on SSI  Anemia: Suspect secondary to CKD- expect some decrease in hemoglobin due to acute illness.  Follow.  Dementia: Appears to have some amount of dementia at baseline-supportive care-expect some delirium during this hospital stay  Palliative care:  Elderly frail 85 year old with dementia and other comorbidities as noted above-with COVID-19 viral pneumonia and respiratory failure.  Currently relatively stable but appears tenuous-and at potential for further deterioration.  Spoke at length with patient's niece Jordan Hawks) and nephew (Stephen)-explained that patient for now appears relatively stable-but if she were to deteriorate-and become more hypoxemic-she will probably not survive this hospitalization.  I have recommended DNR status-her niece Jordan Hawks) is HPOA-she will discuss with other family members-I will reach out to her again tomorrow.  Family is very well aware that we will likely not put her on a ventilator if she develops life-threatening hypoxemia.   ABG:    Component Value Date/Time   HCO3 20.3 01/07/2009 1543   TCO2 22 02/18/2016 1558   ACIDBASEDEF 5.0 (H) 01/07/2009 1543   O2SAT 93.0 01/07/2009 1543    Condition - Extremely Guarded  Family Communication  : Niece and nephew updated over the phone  Code Status :  Full Code  Diet :  Diet Order            Diet heart healthy/carb modified Room service appropriate? Yes; Fluid consistency: Thin  Diet effective now               Disposition Plan  :  Remain inpatient  Consults  :  None  Procedures  :  None  DVT Prophylaxis  :  Heparin   Lab Results  Component Value Date   PLT 339 06/25/2019    Inpatient Medications  Scheduled Meds:  amLODipine  10 mg Oral Daily   azithromycin  500 mg  Oral Daily   dexamethasone (DECADRON) injection  6 mg Intravenous Q24H   feeding supplement (PRO-STAT SUGAR FREE 64)  30 mL Oral BID   gabapentin  100 mg Oral TID   insulin aspart  0-9 Units Subcutaneous TID AC & HS   levothyroxine  75 mcg Oral Q0600   mouth rinse  15 mL Mouth Rinse BID   Continuous Infusions:  cefTRIAXone (ROCEPHIN)  IV 1 g (06/25/19 1884)   [START ON 06/26/2019] remdesivir 100 mg in NS 250 mL     PRN Meds:.HYDROcodone-acetaminophen  Antibiotics  :    Anti-infectives (From admission, onward)   Start     Dose/Rate Route Frequency Ordered Stop   06/26/19 1000  remdesivir 100 mg in sodium chloride 0.9 % 250 mL IVPB     100 mg 500 mL/hr over 30 Minutes Intravenous Every 24 hours 06/25/19 0842 06/30/19 0959   06/25/19 1000  remdesivir 200 mg in sodium chloride 0.9 % 250 mL IVPB     200 mg 500 mL/hr over 30 Minutes Intravenous Once 06/25/19 0842 06/25/19 1041   06/24/19 1000  azithromycin (ZITHROMAX) tablet 500 mg     500 mg Oral Daily 06/24/19 0659     06/24/19 0700  cefTRIAXone (ROCEPHIN) 1 g in sodium chloride 0.9 % 100 mL IVPB     1 g 200 mL/hr over 30 Minutes Intravenous Every 24 hours 06/24/19 0659     06/14/2019 2045  cefTRIAXone (ROCEPHIN) 1 g in sodium chloride 0.9 % 100 mL IVPB     1 g 200 mL/hr over 30 Minutes Intravenous  Once 06/04/2019 2030 06/26/2019 2157   06/24/2019 2045  azithromycin (ZITHROMAX) tablet 500 mg     500 mg Oral  Once 06/15/2019 2030 06/11/2019 2114       Time Spent in minutes 35    Oren Binet M.D on 06/25/2019 at 1:33 PM  To page go to  [email or web address] - use universal password  [facility] -  Office  [phone]  Admit date - 07/02/2019    2    Objective:   Vitals:   06/25/19 0334 06/25/19 0851 06/25/19 0852 06/25/19 0900  BP: 127/60  (!) 127/58   Pulse: 78   85  Resp: (!) 28   (!) 30  Temp: (!) 97.5 F (36.4 C) 97.7 F (36.5 C)    TempSrc: Oral Oral    SpO2: 91%  96% 96%  Weight:      Height:        Wt  Readings from Last 3 Encounters:  06/13/2019 81.6 kg  05/07/19 68.9 kg  02/05/19 64.4 kg     Intake/Output Summary (Last 24 hours) at 06/25/2019 1333 Last data filed at 06/25/2019 1015 Gross per 24 hour  Intake 1308.67 ml  Output 1100 ml  Net 208.67 ml     Physical Exam Gen Exam:Alert awake-not in any distress HEENT:atraumatic, normocephalic Chest: B/L clear to auscultation anteriorly CVS:S1S2 regular Abdomen:soft non tender, non distended Extremities:no edema Neurology: Non Focal Skin: no rash   Data Review:    CBC Recent Labs  Lab 07/03/2019 1732 06/24/19 0638 06/25/19 0220  WBC 7.4 5.5 11.6*  HGB 10.1* 9.5* 9.5*  HCT 34.4* 32.1* 31.4*  PLT 290 293 339  MCV 85.8 85.1 84.6  MCH 25.2* 25.2* 25.6*  MCHC 29.4* 29.6* 30.3  RDW 15.6* 15.2 15.4  LYMPHSABS  --  0.4*  --   MONOABS  --  0.1  --   EOSABS  --  0.0  --   BASOSABS  --  0.0  --     Chemistries  Recent Labs  Lab 06/26/2019 1732 06/24/19 0638 06/25/19 0220  NA 140 140 143  K 5.0 5.3* 5.5*  CL 113* 117* 118*  CO2 19* 13* 16*  GLUCOSE 149* 181* 165*  BUN 43* 40* 41*  CREATININE 2.58* 2.08* 1.77*  CALCIUM 7.8* 7.6* 7.7*  AST 58* 44* 43*  ALT 30 25 23   ALKPHOS 61 54 51  BILITOT 0.4 0.4 0.2*   ------------------------------------------------------------------------------------------------------------------ No results for input(s): CHOL, HDL, LDLCALC, TRIG, CHOLHDL, LDLDIRECT in the last 72 hours.  Lab Results  Component Value Date   HGBA1C 7.1 (H) 06/25/2019   ------------------------------------------------------------------------------------------------------------------ No results for input(s): TSH, T4TOTAL, T3FREE, THYROIDAB in the last 72 hours.  Invalid input(s): FREET3 ------------------------------------------------------------------------------------------------------------------ Recent Labs    06/21/2019 1739 06/25/19 0220  FERRITIN 164 181    Coagulation profile No results for  input(s): INR, PROTIME in the last 168 hours.  Recent Labs    06/04/2019 2238  DDIMER 2.74*    Cardiac Enzymes Recent Labs  Lab 06/18/2019 2238  CKMB 2.5   ------------------------------------------------------------------------------------------------------------------ No results found for: BNP  Micro Results Recent Results (from the past 240 hour(s))  SARS Coronavirus 2 (CEPHEID- Performed in [place] hospital lab), Hosp Order     Status: Abnormal   Collection Time: 06/29/2019  9:15 PM   Specimen: Nasopharyngeal Swab  Result Value Ref Range Status   SARS Coronavirus 2 POSITIVE (A) NEGATIVE Final    Comment: RESULT CALLED TO, READ BACK BY AND VERIFIED WITH: B OAKLEY,RN @2227  06/30/2019 (NOTE) If result is NEGATIVE SARS-CoV-2 target nucleic acids are NOT DETECTED. The SARS-CoV-2 RNA is generally detectable in upper and lower  respiratory specimens during the acute phase of infection. The lowest  concentration of SARS-CoV-2 viral copies this assay can detect is 250  copies / mL. A negative result does not  preclude SARS-CoV-2 infection  and should not be used as the sole basis for treatment or other  patient management decisions.  A negative result may occur with  improper specimen collection / handling, submission of specimen other  than nasopharyngeal swab, presence of viral mutation(s) within the  areas targeted by this assay, and inadequate number of viral copies  (<250 copies / mL). A negative result must be combined with clinical  observations, patient history, and epidemiological information. If result is POSITIVE SARS-CoV-2 target nucleic acids are DETECTED. The SARS-C oV-2 RNA is generally detectable in upper and lower  respiratory specimens during the acute phase of infection.  Positive  results are indicative of active infection with SARS-CoV-2.  Clinical  correlation with patient history and other diagnostic information is  necessary to determine patient infection  status.  Positive results do  not rule out bacterial infection or co-infection with other viruses. If result is PRESUMPTIVE POSTIVE SARS-CoV-2 nucleic acids MAY BE PRESENT.   A presumptive positive result was obtained on the submitted specimen  and confirmed on repeat testing.  While 2019 novel coronavirus  (SARS-CoV-2) nucleic acids may be present in the submitted sample  additional confirmatory testing may be necessary for epidemiological  and / or clinical management purposes  to differentiate between  SARS-CoV-2 and other Sarbecovirus currently known to infect humans.  If clinically indicated additional testing with an alternate test  methodology [phone]) is advised.  The SARS-CoV-2 RNA is generally  detectable in upper and lower respiratory specimens during the acute  phase of infection. The expected result is Negative. Fact Sheet for Patients:  StrictlyIdeas.no Fact Sheet for Healthcare Providers: BankingDealers.co.za This test is not yet approved or cleared by the [place] FDA and has been authorized for detection and/or diagnosis of SARS-CoV-2 by FDA under an Emergency Use Authorization (EUA).  This EUA will remain in effect (meaning this test can be used) for the duration of the COVID-19 declaration under Section 564(b)(1) of the Act, 21 U.S.C. section 360bbb-3(b)(1), unless the authorization is terminated or revoked sooner. Performed at [facility], [address], [place], [place] [address]   Culture, blood (Routine X 2) w Reflex to ID Panel     Status: None (Preliminary result)   Collection Time: 06/05/2019 10:29 PM   Specimen: BLOOD  Result Value Ref Range Status   Specimen Description BLOOD LEFT ANTECUBITAL  Final   Special Requests   Final    BOTTLES DRAWN AEROBIC AND ANAEROBIC Blood Culture adequate volume   Culture   Final    NO GROWTH 2 DAYS Performed at [facility], [address] [place], [place] [address]      Report Status PENDING  Incomplete  Culture, blood (Routine X 2) w Reflex to ID Panel     Status: None (Preliminary result)   Collection Time: 06/14/2019 10:38 PM   Specimen: BLOOD  Result Value Ref Range Status   Specimen Description BLOOD LEFT ANTECUBITAL  Final   Special Requests   Final    BOTTLES DRAWN AEROBIC AND ANAEROBIC Blood Culture adequate volume   Culture   Final    NO GROWTH 2 DAYS Performed at [facility], [address], [place], [place] [address]    Report Status PENDING  Incomplete    Radiology Reports Dg Chest Port 1 View  Result Date: 06/26/2019 CLINICAL DATA:  Weakness EXAM: PORTABLE CHEST 1 VIEW COMPARISON:  04/02/2014 FINDINGS: Cardiac shadow is near the upper limits of normal in size. Aortic calcifications are  noted. Patchy infiltrative changes are noted in the mid and lower left lung and right lung base new from the prior exam. No acute bony abnormality is seen. IMPRESSION: Patchy bilateral infiltrates. Electronically Signed   By: Inez Catalina M.D.   On: 06/24/2019 19:38   US Abdomen Limited Ruq  Result Date: 06/24/2019 CLINICAL DATA:  Elevated liver function studies. Previous cholecystectomy. EXAM: ULTRASOUND ABDOMEN LIMITED RIGHT UPPER QUADRANT COMPARISON:  Report only from abdominal CT 12/18/2007. FINDINGS: Gallbladder: Cholecystectomy. Common bile duct: Diameter: 4 mm. Liver: Partially obscured by bowel gas and overlying colostomy bag. The liver is mildly heterogeneous in echotexture without focal abnormality. Portal vein is patent on color Doppler imaging with normal direction of blood flow towards the liver. IMPRESSION: 1. No biliary dilatation post cholecystectomy. 2. Nonspecific heterogeneity of the hepatic parenchyma without focal abnormality. Electronically Signed   By: Richardean Sale M.D.   On: 06/24/2019 12:58

## 2019-06-25 NOTE — Progress Notes (Signed)
Pharmacy Antibiotic Note  [name] is a 85 y.o. female admitted on 06/29/2019 with COVID 19.  Pharmacy has been consulted for Remdesivir dosing.  CXR shows patchy bilateral infiltrates Pt requiring 4L [place] ALT 23  Plan: Remdesivir 200mg  IV now then 100mg  IV daily x 4 days Will f/u ALT and pt's clinical condition   Height: 5' (152.4 cm) Weight: 180 lb (81.6 kg) IBW/kg (Calculated) : 45.5  Temp (24hrs), Avg:97.7 F (36.5 C), Min:97.4 F (36.3 C), Max:98.3 F (36.8 C)  Recent Labs  Lab 06/10/2019 1732 06/19/2019 2238 06/24/19 0638 06/25/19 0220  WBC 7.4  --  5.5 11.6*  CREATININE 2.58*  --  2.08* 1.77*  LATICACIDVEN  --  0.9  --   --     Estimated Creatinine Clearance: 19.6 mL/min (A) (by C-G formula based on SCr of 1.77 mg/dL (H)).    No Known Allergies  Antimicrobials this admission: 7/20 Azith>> 7/20 Ceftriaxone >>  Microbiology results: 7/20 BCx: ngtd  Thank you for allowing pharmacy to be a part of this patient's care.  Sherlon Handing, PharmD, BCPS CGV Clinical pharmacist phone [phone] 06/25/2019 8:40 AM

## 2019-06-26 ENCOUNTER — Ambulatory Visit (HOSPITAL_COMMUNITY): Payer: Medicare HMO | Admitting: Specialist

## 2019-06-26 ENCOUNTER — Inpatient Hospital Stay (HOSPITAL_COMMUNITY): Payer: Medicare HMO

## 2019-06-26 LAB — POCT I-STAT 7, (LYTES, BLD GAS, ICA,H+H)
Acid-base deficit: 10 mmol/L — ABNORMAL HIGH (ref 0.0–2.0)
Bicarbonate: 13.7 mmol/L — ABNORMAL LOW (ref 20.0–28.0)
Calcium, Ion: 1.22 mmol/L (ref 1.15–1.40)
HCT: 33 % — ABNORMAL LOW (ref 36.0–46.0)
Hemoglobin: 11.2 g/dL — ABNORMAL LOW (ref 12.0–15.0)
O2 Saturation: 76 %
Potassium: 5 mmol/L (ref 3.5–5.1)
Sodium: 148 mmol/L — ABNORMAL HIGH (ref 135–145)
TCO2: 14 mmol/L — ABNORMAL LOW (ref 22–32)
pCO2 arterial: 24.9 mmHg — ABNORMAL LOW (ref 32.0–48.0)
pH, Arterial: 7.348 — ABNORMAL LOW (ref 7.350–7.450)
pO2, Arterial: 42 mmHg — ABNORMAL LOW (ref 83.0–108.0)

## 2019-06-26 LAB — COMPREHENSIVE METABOLIC PANEL
ALT: 24 U/L (ref 0–44)
AST: 44 U/L — ABNORMAL HIGH (ref 15–41)
Albumin: 2.6 g/dL — ABNORMAL LOW (ref 3.5–5.0)
Alkaline Phosphatase: 65 U/L (ref 38–126)
Anion gap: 13 (ref 5–15)
BUN: 46 mg/dL — ABNORMAL HIGH (ref 8–23)
CO2: 15 mmol/L — ABNORMAL LOW (ref 22–32)
Calcium: 8.2 mg/dL — ABNORMAL LOW (ref 8.9–10.3)
Chloride: 121 mmol/L — ABNORMAL HIGH (ref 98–111)
Creatinine, Ser: 1.69 mg/dL — ABNORMAL HIGH (ref 0.44–1.00)
GFR calc Af Amer: 30 mL/min — ABNORMAL LOW (ref >60)
GFR calc non Af Amer: 26 mL/min — ABNORMAL LOW (ref >60)
Glucose, Bld: 174 mg/dL — ABNORMAL HIGH (ref 70–99)
Potassium: 5.8 mmol/L — ABNORMAL HIGH (ref 3.5–5.1)
Sodium: 149 mmol/L — ABNORMAL HIGH (ref 135–145)
Total Bilirubin: 0.3 mg/dL (ref 0.3–1.2)
Total Protein: 7 g/dL (ref 6.5–8.1)

## 2019-06-26 LAB — CBC
HCT: 36.3 % (ref 36.0–46.0)
Hemoglobin: 11.1 g/dL — ABNORMAL LOW (ref 12.0–15.0)
MCH: 25.5 pg — ABNORMAL LOW (ref 26.0–34.0)
MCHC: 30.6 g/dL (ref 30.0–36.0)
MCV: 83.3 fL (ref 80.0–100.0)
Platelets: 377 10*3/uL (ref 150–400)
RBC: 4.36 MIL/uL (ref 3.87–5.11)
RDW: 15.6 % — ABNORMAL HIGH (ref 11.5–15.5)
WBC: 15.5 10*3/uL — ABNORMAL HIGH (ref 4.0–10.5)
nRBC: 0.3 % — ABNORMAL HIGH (ref 0.0–0.2)

## 2019-06-26 LAB — C-REACTIVE PROTEIN: CRP: 10.8 mg/dL — ABNORMAL HIGH (ref <1.0)

## 2019-06-26 LAB — GLUCOSE, CAPILLARY
Glucose-Capillary: 105 mg/dL — ABNORMAL HIGH (ref 70–99)
Glucose-Capillary: 135 mg/dL — ABNORMAL HIGH (ref 70–99)
Glucose-Capillary: 155 mg/dL — ABNORMAL HIGH (ref 70–99)

## 2019-06-26 LAB — D-DIMER, QUANTITATIVE: D-Dimer, Quant: 6.58 ug/mL-FEU — ABNORMAL HIGH (ref 0.00–0.50)

## 2019-06-26 LAB — LEGIONELLA PNEUMOPHILA SEROGP 1 UR AG: L. pneumophila Serogp 1 Ur Ag: NEGATIVE

## 2019-06-26 LAB — PROCALCITONIN: Procalcitonin: 0.57 ng/mL

## 2019-06-26 LAB — FERRITIN: Ferritin: 169 ng/mL (ref 11–307)

## 2019-06-26 MED ORDER — LORAZEPAM 0.5 MG PO TABS: 1.0000 mg | ORAL_TABLET | ORAL | Status: DC | PRN

## 2019-06-26 MED ORDER — GLYCOPYRROLATE 1 MG PO TABS
1.0000 mg | ORAL_TABLET | ORAL | Status: DC | PRN
  Filled 2019-06-26: qty 1

## 2019-06-26 MED ORDER — SODIUM CHLORIDE 0.9 % IV SOLN: 250.0000 mL | INTRAVENOUS | Status: DC | PRN

## 2019-06-26 MED ORDER — LORAZEPAM 2 MG/ML PO CONC: 1.0000 mg | ORAL | Status: DC | PRN

## 2019-06-26 MED ORDER — HALOPERIDOL LACTATE 2 MG/ML PO CONC
0.5000 mg | ORAL | Status: DC | PRN
  Filled 2019-06-26: qty 0.3

## 2019-06-26 MED ORDER — MORPHINE SULFATE (CONCENTRATE) 10 MG/0.5ML PO SOLN
5.0000 mg | ORAL | Status: DC | PRN
  Administered 2019-06-26 (×2): 5 mg via ORAL
  Filled 2019-06-26 (×2): qty 0.5

## 2019-06-26 MED ORDER — LORAZEPAM 2 MG/ML IJ SOLN
0.5000 mg | INTRAMUSCULAR | Status: DC | PRN
  Administered 2019-06-26: 0.5 mg via INTRAVENOUS
  Filled 2019-06-26: qty 1

## 2019-06-26 MED ORDER — SODIUM CHLORIDE 0.9% FLUSH
3.0000 mL | Freq: Two times a day (BID) | INTRAVENOUS | Status: DC
  Administered 2019-06-26: 3 mL via INTRAVENOUS

## 2019-06-26 MED ORDER — MORPHINE 100MG IN NS 100ML (1MG/ML) PREMIX INFUSION
1.0000 mg/h | INTRAVENOUS | Status: DC
  Administered 2019-06-26: 1 mg/h via INTRAVENOUS
  Filled 2019-06-26: qty 100

## 2019-06-26 MED ORDER — ACETAMINOPHEN 650 MG RE SUPP: 650.0000 mg | Freq: Four times a day (QID) | RECTAL | Status: DC | PRN

## 2019-06-26 MED ORDER — ONDANSETRON 4 MG PO TBDP: 4.0000 mg | ORAL_TABLET | Freq: Four times a day (QID) | ORAL | Status: DC | PRN

## 2019-06-26 MED ORDER — GLYCOPYRROLATE 0.2 MG/ML IJ SOLN: 0.2000 mg | INTRAMUSCULAR | Status: DC | PRN

## 2019-06-26 MED ORDER — POLYVINYL ALCOHOL 1.4 % OP SOLN
1.0000 [drp] | Freq: Four times a day (QID) | OPHTHALMIC | Status: DC | PRN
  Filled 2019-06-26: qty 15

## 2019-06-26 MED ORDER — LORAZEPAM 2 MG/ML IJ SOLN
0.5000 mg | Freq: Once | INTRAMUSCULAR | Status: AC
  Administered 2019-06-26: 0.5 mg via INTRAVENOUS
  Filled 2019-06-26: qty 1

## 2019-06-26 MED ORDER — HALOPERIDOL 0.5 MG PO TABS
0.5000 mg | ORAL_TABLET | ORAL | Status: DC | PRN
  Filled 2019-06-26: qty 1

## 2019-06-26 MED ORDER — LORAZEPAM 2 MG/ML IJ SOLN
1.0000 mg | INTRAMUSCULAR | Status: DC | PRN
  Administered 2019-06-26: 1 mg via INTRAVENOUS

## 2019-06-26 MED ORDER — HEPARIN SODIUM (PORCINE) 10000 UNIT/ML IJ SOLN
7500.0000 [IU] | Freq: Three times a day (TID) | INTRAMUSCULAR | Status: DC
  Administered 2019-06-26: 7500 [IU] via SUBCUTANEOUS
  Filled 2019-06-26: qty 1

## 2019-06-26 MED ORDER — LORAZEPAM 2 MG/ML IJ SOLN
1.0000 mg | INTRAMUSCULAR | Status: DC | PRN
  Filled 2019-06-26: qty 1

## 2019-06-26 MED ORDER — DEXAMETHASONE SODIUM PHOSPHATE 10 MG/ML IJ SOLN: 10.0000 mg | INTRAMUSCULAR | Status: DC

## 2019-06-26 MED ORDER — SODIUM CHLORIDE 0.9% FLUSH: 3.0000 mL | INTRAVENOUS | Status: DC | PRN

## 2019-06-26 MED ORDER — TOCILIZUMAB 400 MG/20ML IV SOLN
600.0000 mg | Freq: Once | INTRAVENOUS | Status: AC
  Administered 2019-06-26: 600 mg via INTRAVENOUS
  Filled 2019-06-26: qty 20

## 2019-06-26 MED ORDER — DIPHENHYDRAMINE HCL 50 MG/ML IJ SOLN: 12.5000 mg | INTRAMUSCULAR | Status: DC | PRN

## 2019-06-26 MED ORDER — MORPHINE BOLUS VIA INFUSION
1.0000 mg | INTRAVENOUS | Status: DC | PRN
  Administered 2019-06-26 (×2): 1 mg via INTRAVENOUS
  Filled 2019-06-26: qty 1

## 2019-06-26 MED ORDER — ACETAMINOPHEN 325 MG PO TABS: 650.0000 mg | ORAL_TABLET | Freq: Four times a day (QID) | ORAL | Status: DC | PRN

## 2019-06-26 MED ORDER — HALOPERIDOL LACTATE 5 MG/ML IJ SOLN
0.5000 mg | INTRAMUSCULAR | Status: DC | PRN
  Administered 2019-06-26: 0.5 mg via INTRAVENOUS
  Filled 2019-06-26: qty 1

## 2019-06-26 MED ORDER — BIOTENE DRY MOUTH MT LIQD: 15.0000 mL | OROMUCOSAL | Status: DC | PRN

## 2019-06-26 MED ORDER — SODIUM CHLORIDE 0.45 % IV SOLN
INTRAVENOUS | Status: DC
  Administered 2019-06-26: 09:00:00 via INTRAVENOUS

## 2019-06-26 MED ORDER — ONDANSETRON HCL 4 MG/2ML IJ SOLN: 4.0000 mg | Freq: Four times a day (QID) | INTRAMUSCULAR | Status: DC | PRN

## 2019-06-26 NOTE — Progress Notes (Signed)
OT Evaluation  PTA, pt lived at home with her nephew and was modified independent with mobility and ADL with use of cane. Pt seen on 15 L HFNC in combination with NRB. Session limited due to respiratory status. SpO2 varied from 88  - 90 in seated position using ear probe, to 79 - 81 using Nelcor forehead device with good pleth. RR increased into 40s with bed mobility. Pt very pleasant but appeared anxious throughout session. Pt will most likely require SNF for rehab. Will follow acutely.     06/26/19 1000  OT Visit Information  Last OT Received On 06/26/19  Assistance Needed +2  PT/OT/SLP Co-Evaluation/Treatment Yes  Reason for Co-Treatment For patient/therapist safety  OT goals addressed during session ADL's and self-care  History of Present Illness 85 y.o. female with PMHx of HTN, DM-2, CKD stage IV, hypothyroidism, PAD s/p left femoral-popliteal bypass in 2010, dementia-presented with generalized weakness-found to have acute hypoxic respiratory failure in the setting of COVID-19 viral pneumonia  Precautions  Precautions Fall  Precaution Comments watch O2 sats  Home Living  Family/patient expects to be discharged to: Private residence  Living Arrangements Other relatives (newphew)  Available Help at Discharge Available 24 hours/day  Type of [place] to enter  Entrance Stairs-Number of Steps 2  Entrance Stairs-Rails [place] One level;Able to live on main level with bedroom/bathroom  Bathroom Biomedical scientist Yes  [place] - single point;Tub bench;Grab bars - toilet;BSC;Walker - 2 wheels;Wheelchair - manual  Prior Function  Level of Independence Independent with assistive device(s)  Comments uses a cane; likes [employer]; enjoys reading mysteries  Communication  Communication HOH  Pain Assessment  Pain Assessment Faces  Faces Pain Scale 4  Pain Location R shoulder   Cognition  Arousal/Alertness Awake/alert  Behavior During Therapy WFL for tasks assessed/performed  Overall Cognitive Status Impaired/Different from baseline  Area of Impairment Orientation;Awareness;Memory  Orientation Level Disoriented to;Time  Memory Decreased short-term memory  Awareness Emergent  Upper Extremity Assessment  Upper Extremity Assessment RUE deficits/detail;LUE deficits/detail  RUE Deficits / Details pain with shoulder ROMAble to achieve @ 45 degrees with pain; other AROM WFL  LUE Deficits / Details AROM WFL with the exception of shoulder flex. @ 80 degrees AAROm  Lower Extremity Assessment  Lower Extremity Assessment Defer to PT evaluation  Cervical / Trunk Assessment  Cervical / Trunk Assessment Other exceptions  ADL  Overall ADL's  Needs assistance/impaired  Eating/Feeding Minimal assistance  Grooming Moderate assistance;Bed level  Upper Body Bathing Maximal assistance;Bed level  Lower Body Bathing Maximal assistance;Bed level  Upper Body Dressing  Maximal assistance;Bed level  Lower Body Dressing Maximal assistance;Bed level  General ADL Comments limited by poor tolerance  Vision- History  Baseline Vision/History Wears glasses  Wears Glasses Reading only  Bed Mobility  Overal bed mobility Needs Assistance  Bed Mobility Rolling  Rolling Min assist  General bed mobility comments increased SOB with bed mobility  Transfers  General transfer comment NA due to SOB  General Comments  General comments (skin integrity, edema, etc.) SpO2 79-80 with Nelcor; HR 103 which is same HR on monitor however monitor shows SpO2 90 15 L HFNC and NRB  OT - End of Session  Equipment Utilized During Treatment Oxygen (15 L HFNC; NRB)  Activity Tolerance Patient limited by fatigue;Treatment limited secondary to medical complications (Comment) (SpO2 sats)  Patient left in bed;with call bell/phone within reach;with bed  alarm set;with nursing/sitter in room  Nurse Communication  Mobility status;Other (comment) (SpO2 )  OT Assessment  OT Recommendation/Assessment Patient needs continued OT Services  OT Visit Diagnosis Other abnormalities of gait and mobility (R26.89);Muscle weakness (generalized) (M62.81);Other symptoms and signs involving cognitive function;Pain  Pain - Right/Left Left  Pain - part of body  (B shoulders)  OT Problem List Decreased strength;Decreased activity tolerance;Impaired balance (sitting and/or standing);Decreased safety awareness;Cardiopulmonary status limiting activity;Pain  OT Plan  OT Frequency (ACUTE ONLY) Min 2X/week  OT Treatment/Interventions (ACUTE ONLY) Self-care/ADL training;Therapeutic exercise;Neuromuscular education;Energy conservation;DME and/or AE instruction;Therapeutic activities;Cognitive remediation/compensation;Patient/family education;Balance training  AM-PAC OT "6 Clicks" Daily Activity Outcome Measure (Version 2)  Help from another person eating meals? 3  Help from another person taking care of personal grooming? 2  Help from another person toileting, which includes using toliet, bedpan, or urinal? 2  Help from another person bathing (including washing, rinsing, drying)? 2  Help from another person to put on and taking off regular upper body clothing? 2  Help from another person to put on and taking off regular lower body clothing? 2  6 Click Score 13  OT Recommendation  Follow Up Recommendations SNF;Supervision/Assistance - 24 hour  OT Equipment None recommended by OT  Individuals Consulted  Consulted and Agree with Results and Recommendations Patient  Acute Rehab OT Goals  Patient Stated Goal to get better  OT Goal Formulation With patient  Time For Goal Achievement 07/10/19  Potential to Achieve Goals Good  OT Time Calculation  OT Start Time (ACUTE ONLY) 0958  OT Stop Time (ACUTE ONLY) 1035  OT Time Calculation (min) 37 min  OT General Charges  $OT Visit 1 Visit  OT Evaluation  $OT Eval Moderate  Complexity 1 Mod  Written Expression  Dominant Hand Right  Maurie Boettcher, OT/L   Acute OT Clinical Specialist [place] Pager [phone] Office [phone]

## 2019-06-26 NOTE — Progress Notes (Signed)
Pt taken back to room. Pt is comfortable at this time has no needs.

## 2019-06-26 NOTE — Evaluation (Signed)
Physical Therapy Evaluation and Discharge Patient Details Name: SINTHIA KARABIN MRN: [id number] DOB: 14-Dec-1925 Today's Date: 06/26/2019   History of Present Illness  85 y.o. female with PMHx of HTN, DM-2, CKD stage IV, hypothyroidism, PAD s/p left femoral-popliteal bypass in 2010, dementia-presented with generalized weakness-found to have acute hypoxic respiratory failure in the setting of COVID-19 viral pneumonia  Clinical Impression   Patient was seen for PT evaluation mid-morning. Patient was alert, appropriate and limited by dyspnea (35-40) with  decr sats (Nelcor forehead probe 79-81%; earlobe probe 90-99%). Educated pt on pursed lip breathing and deep breaths with tactile cues at lower ribs. Placed pt in chair position in bed and initially sats improved to 90% on forehead probe. Tolerated 7-8 minutes. However pt became anxious with rapid, shallow breaths and requesting to turn on her side. Assisted her to reposition and vc for slowing her breaths. Patient calm at end of session and RN aware of her tolerance.   At time of documenting this earlier session, pt has had a decline in status (cognitively and Copywriter, advertising and physician notes). Patient has been transitioned to comfort care and is no longer appropriate for PT.     Follow Up Recommendations No PT follow up    Equipment Recommendations  None recommended by PT    Recommendations for Other Services       Precautions / Restrictions Precautions Precautions: Fall Precaution Comments: watch O2 sats Restrictions Weight Bearing Restrictions: No      Mobility  Bed Mobility Overal bed mobility: Needs Assistance Bed Mobility: Rolling Rolling: Min assist         General bed mobility comments: increased SOB with bed mobility  Transfers                 General transfer comment: NA due to SOB, decr Sats, incr RR  Ambulation/Gait                Stairs            Wheelchair Mobility    Modified  Rankin (Stroke Patients Only)       Balance                                             Pertinent Vitals/Pain Pain Assessment: Faces Faces Pain Scale: Hurts little more Pain Location: R shoulder Pain Descriptors / Indicators: Grimacing;Guarding Pain Intervention(s): Limited activity within patient's tolerance;Monitored during session;Repositioned    Home Living Family/patient expects to be discharged to:: Private residence Living Arrangements: Other relatives(newphew) Available Help at Discharge: Available 24 hours/day Type of Home: House Home Access: Stairs to enter Entrance Stairs-Rails: Psychiatric nurse of Steps: 2 Home Layout: One level;Able to live on main level with bedroom/bathroom Home Equipment: Kasandra Knudsen - single point;Tub bench;Grab bars - toilet;Bedside commode;Walker - 2 wheels;Wheelchair - manual      Prior Function Level of Independence: Independent with assistive device(s)         Comments: uses a cane; likes [employer]; enjoys reading mysteries     Hand Dominance   Dominant Hand: Right    Extremity/Trunk Assessment   Upper Extremity Assessment Upper Extremity Assessment: Defer to OT evaluation    Lower Extremity Assessment Lower Extremity Assessment: Generalized weakness    Cervical / Trunk Assessment Cervical / Trunk Assessment: Kyphotic  Communication   Communication: HOH  Cognition Arousal/Alertness: Awake/alert Behavior During  Therapy: WFL for tasks assessed/performed Overall Cognitive Status: Impaired/Different from baseline Area of Impairment: Orientation;Awareness;Memory                 Orientation Level: Disoriented to;Time   Memory: Decreased short-term memory     Awareness: Emergent          General Comments General comments (skin integrity, edema, etc.): SpO2 79-80 with Nelcor; HR 103 which is same HR on monitor however monitor shows SpO2 90 15 L HFNC and NRB    Exercises      Assessment/Plan    PT Assessment Patent does not need any further PT services  PT Problem List         PT Treatment Interventions      PT Goals (Current goals can be found in the Care Plan section)  Acute Rehab PT Goals PT Goal Formulation: (pt transitioning to comfort care; declined after PT session)    Frequency     Barriers to discharge        Co-evaluation PT/OT/SLP Co-Evaluation/Treatment: Yes Reason for Co-Treatment: For patient/therapist safety PT goals addressed during session: Other (comment)(breathing techniques)         AM-PAC PT "6 Clicks" Mobility  Outcome Measure Help needed turning from your back to your side while in a flat bed without using bedrails?: A Little Help needed moving from lying on your back to sitting on the side of a flat bed without using bedrails?: A Lot Help needed moving to and from a bed to a chair (including a wheelchair)?: Total Help needed standing up from a chair using your arms (e.g., wheelchair or bedside chair)?: Total Help needed to walk in hospital room?: Total Help needed climbing 3-5 steps with a railing? : Total 6 Click Score: 9    End of Session Equipment Utilized During Treatment: Oxygen Activity Tolerance: Treatment limited secondary to medical complications (Comment) Patient left: in bed;with call bell/phone within reach;with bed alarm set;with nursing/sitter in room Nurse Communication: Other (comment)(repositioned; Nelcor forehead probe vs earlobe readings) PT Visit Diagnosis: Muscle weakness (generalized) (M62.81)    Time: 2202-5427 PT Time Calculation (min) (ACUTE ONLY): 37 min   Charges:   PT Evaluation $PT Eval Moderate Complexity: 1 Mod            [employer] , PT 06/26/2019, 4:52 PM

## 2019-06-26 NOTE — Progress Notes (Signed)
Pt continues to desat on NRB mask, HFNC taken off as pt continues to pull off. ABG obtained, MD at the bedside, CXRAY obtained, MD on the phone with family. Ativaqn x1 dose given for anxiety.

## 2019-06-26 NOTE — Progress Notes (Signed)
Received call from tele that pts heart rate was brady to 39 then 29. On assessment pt has attempted to get out of bed, removed NRB mask, High flow [place] remains in place, Legs hanging out side of bed, pt is unresponsive. NRB mask applied, pt repositioned in bed, HR began to increase to 115, O2 gradually increasing, MD notified.   Pt now 85-90% oxygen, HR 100's and irregular, MD in route to assess pt. Pt remains unresponsive.   14:40 Pt is now becoming responsive. Pt very confused. MD at the bedside. MD calling family to talk with family about current situation.

## 2019-06-26 NOTE — Progress Notes (Signed)
Pt taken to room 172 for 2 family members to visit. Pt began to wake up and get agitated. Morphine bolus given. Pt resting in room at this time awaiting family to visit. Pt remains on HFNC at 15L

## 2019-06-26 NOTE — Progress Notes (Addendum)
PROGRESS NOTE                                                                                                                                                                                                             Patient Demographics:    [name], is a 85 y.o. female, DOB - 04-18-1926, XLK:[id number]  Outpatient Primary MD for the patient is Lucianne Lei, MD    LOS - 3  Chief Complaint  Patient presents with  . Weakness       Brief Narrative: Patient is a 85 y.o. female with PMHx of HTN, DM-2, CKD stage IV, hypothyroidism, PAD s/p left femoral-popliteal bypass in 2010, dementia-presented with generalized weakness-found to have acute hypoxic respiratory failure in the setting of COVID-19 viral pneumonia.  Hospital course complicated by development of worsening hypoxemia-requiring 100% nonrebreather mask.   Subjective:    [name] was slightly tachypneic this morning but more or less comfortable.  She seemed slightly anxious as well.  On 100% nonrebreather mask-she markedly worsened overnight.   Assessment  & Plan :   Acute hypoxic respiratory failure secondary to COVID-19 pneumonia: Worsened overnight-in spite of being on steroids and Remdesivir was on 3.5 L of oxygen yesterday-now on 100% NRB with O2 saturation mostly in the mid 80s.  Reached out to multiple family members this morning-spoke with [place] asked me to talk to her cousins were in the medical field-I subsequently spoke with York Cerise (retired Electrical engineer) and Company secretary (NP in [place]).  Family aware of potential for further deterioration-and likelihood that patient may not survive this hospitalization.  After extensive discussion-family agreeable for DNR status-and are leaning towards hospice care if patient continues to deteriorate.  We spoke about continuing medical treatment-we briefly discussed Actemra-Keisha (NP in [place])-consented  to the use of Actemra after discussion of rationale/risks/benefits.  Plan is to continue supportive care-watch closely-if she deteriorates-we will initiate hospice care.  COVID-19 Labs:  Recent Labs    06/17/2019 1739 06/19/2019 2238 06/25/19 0220 06/26/19 0340  DDIMER  --  2.74*  --  6.58*  FERRITIN 164  --  181 169  LDH  --  343*  --   --   CRP 18.9*  --  11.3* 10.8*    Lab Results  Component Value Date   SARSCOV2NAA POSITIVE (A) 06/14/2019  COVID-19 Medications: 7/23>>Actemra 7/20>> Decadron 7/22>> Remdesivir  The rationale for the off label use of Actemra its known side effects, potential benefits was  discussed with family.The use of Actemra is based on published clinical articles/anecdotal data and not on randomized control trials.  Complete risks and long-term side effects are unknown, however in the best clinical judgment they seem to have of some clinical benefit which at this time outweighs medical risks given tenuous clinical state of the patient.  Family agree's with the treatment plan and consent to the use of Actemra.   AKI on CKD stage IV: AKI likely hemodynamically mediated in the setting of COVID-19 pneumonia-renal function continues to slowly improve with supportive care.   Mild hyperkalemia: Resolved with Lokelma-follow periodically.    Hypernatremia: Gently start hydration with half-normal saline.  Non-anion gap metabolic acidosis: Likely secondary to AKI-may have type IV RTA as well.  Continue supportive care.    Hypothyroidism: Continue Synthroid  HTN: Stable-continue amlodipine  DM-2 (A1c 7.1 on 06/25/2019): CBG stable with SSI-likely diet controlled at home as patient does not appear to be on any oral hypoglycemic agents.  Anemia: Suspect secondary to CKD- expect some decrease in hemoglobin due to acute illness.  Follow.  Dementia: Appears to have some amount of dementia at baseline-supportive care-expect some delirium during this hospital stay   Palliative care: See above discussion-elderly frail 85 year old with dementia-with worsening hypoxia secondary to COVID-19 pneumonia.  Long discussion with family again this morning (see above)-family very well aware of poor overall prognosis-and high likelihood that patient may not survive this hospitalization.  While family continues that discussion as to how to proceed next-family agreeable to start [place] to DNR.  Family well aware that if patient continues to deteriorate-we will initiate hospice care.  In the meantime we will go and start Ativan and morphine as needed for comfort.  ABG:    Component Value Date/Time   PHART 7.348 (L) 06/26/2019 0751   PCO2ART 24.9 (L) 06/26/2019 0751   PO2ART 42.0 (L) 06/26/2019 0751   HCO3 13.7 (L) 06/26/2019 0751   TCO2 14 (L) 06/26/2019 0751   ACIDBASEDEF 10.0 (H) 06/26/2019 0751   O2SAT 76.0 06/26/2019 0751    Condition - Extremely Guarded  Family Communication  : See above-3 nieces over the phone this morning  Code Status :  Full Code  Diet :  Diet Order            Diet heart healthy/carb modified Room service appropriate? Yes; Fluid consistency: Thin  Diet effective now               Disposition Plan  :  Remain inpatient-may discharge to residential hospice if clinical deterioration continues  Consults  :  None  Procedures  :  None  DVT Prophylaxis  :  Heparin-we will change to higher dosing given elevated d-dimer  Lab Results  Component Value Date   PLT 377 06/26/2019    Inpatient Medications  Scheduled Meds: . amLODipine  10 mg Oral Daily  . dexamethasone (DECADRON) injection  6 mg Intravenous Q24H  . feeding supplement (PRO-STAT SUGAR FREE 64)  30 mL Oral BID  . gabapentin  100 mg Oral TID  . heparin injection (subcutaneous)  5,000 Units Subcutaneous Q8H  . insulin aspart  0-9 Units Subcutaneous TID AC & HS  . levothyroxine  75 mcg Oral Q0600  . mouth rinse  15 mL Mouth Rinse BID   Continuous  Infusions: . sodium chloride 75 mL/hr  at 06/26/19 0849  . remdesivir 100 mg in NS 250 mL    . tocilizumab (ACTEMRA) IV     PRN Meds:.acetaminophen, HYDROcodone-acetaminophen, LORazepam, morphine CONCENTRATE  Antibiotics  :    Anti-infectives (From admission, onward)   Start     Dose/Rate Route Frequency Ordered Stop   06/26/19 1000  remdesivir 100 mg in sodium chloride 0.9 % 250 mL IVPB     100 mg 500 mL/hr over 30 Minutes Intravenous Every 24 hours 06/25/19 0842 06/30/19 0959   06/25/19 1000  remdesivir 200 mg in sodium chloride 0.9 % 250 mL IVPB     200 mg 500 mL/hr over 30 Minutes Intravenous Once 06/25/19 0842 06/25/19 1041   06/24/19 1000  azithromycin (ZITHROMAX) tablet 500 mg  Status:  Discontinued     500 mg Oral Daily 06/24/19 0659 06/25/19 1343   06/24/19 0700  cefTRIAXone (ROCEPHIN) 1 g in sodium chloride 0.9 % 100 mL IVPB  Status:  Discontinued     1 g 200 mL/hr over 30 Minutes Intravenous Every 24 hours 06/24/19 0659 06/25/19 1343   06/18/2019 2045  cefTRIAXone (ROCEPHIN) 1 g in sodium chloride 0.9 % 100 mL IVPB     1 g 200 mL/hr over 30 Minutes Intravenous  Once 06/24/2019 2030 06/29/2019 2157   06/06/2019 2045  azithromycin (ZITHROMAX) tablet 500 mg     500 mg Oral  Once 06/14/2019 2030 06/13/2019 2114       Time Spent in minutes 45  The patient is critically ill with multiple organ system failure and requires high complexity decision making for assessment and support, frequent evaluation and titration of therapies, advanced monitoring, review of radiographic studies and interpretation of complex data.   Oren Binet M.D on 06/26/2019 at 10:33 AM  To page go to [email or web address] - use universal password  [facility] -  Office  [phone]  Admit date - 06/17/2019    3    Objective:   Vitals:   06/26/19 0656 06/26/19 0700 06/26/19 0752 06/26/19 0800  BP:   123/64 123/64  Pulse:  96 99 (!) 105  Resp:  (!) 29 (!) 35 (!) 36  Temp:    98.2 F (36.8 C)  TempSrc:     Oral  SpO2: (!) 68% (!) 89% (!) 84% (!) 80%  Weight:      Height:        Wt Readings from Last 3 Encounters:  06/30/2019 81.6 kg  05/07/19 68.9 kg  02/05/19 64.4 kg     Intake/Output Summary (Last 24 hours) at 06/26/2019 1033 Last data filed at 06/26/2019 0622 Gross per 24 hour  Intake 0 ml  Output 750 ml  Net -750 ml     Physical Exam  Gen Exam:Alert-pleasantly confused-slightly tachypneic but appears comfortable  HEENT:atraumatic, normocephalic Chest: Bibasilar rales CVS:S1S2 regular Abdomen:soft non tender, non distended Extremities:no edema Neurology: Non focal Skin: no rash   Data Review:    CBC Recent Labs  Lab 06/09/2019 1732 06/24/19 0638 06/25/19 0220 06/26/19 0340 06/26/19 0751  WBC 7.4 5.5 11.6* 15.5*  --   HGB 10.1* 9.5* 9.5* 11.1* 11.2*  HCT 34.4* 32.1* 31.4* 36.3 33.0*  PLT 290 293 339 377  --   MCV 85.8 85.1 84.6 83.3  --   MCH 25.2* 25.2* 25.6* 25.5*  --   MCHC 29.4* 29.6* 30.3 30.6  --   RDW 15.6* 15.2 15.4 15.6*  --   LYMPHSABS  --  0.4*  --   --   --  MONOABS  --  0.1  --   --   --   EOSABS  --  0.0  --   --   --   BASOSABS  --  0.0  --   --   --     Chemistries  Recent Labs  Lab 06/19/2019 1732 06/24/19 0638 06/25/19 0220 06/26/19 0340 06/26/19 0751  NA 140 140 143 149* 148*  K 5.0 5.3* 5.5* 5.8* 5.0  CL 113* 117* 118* 121*  --   CO2 19* 13* 16* 15*  --   GLUCOSE 149* 181* 165* 174*  --   BUN 43* 40* 41* 46*  --   CREATININE 2.58* 2.08* 1.77* 1.69*  --   CALCIUM 7.8* 7.6* 7.7* 8.2*  --   AST 58* 44* 43* 44*  --   ALT 30 25 23 24   --   ALKPHOS 61 54 51 65  --   BILITOT 0.4 0.4 0.2* 0.3  --    ------------------------------------------------------------------------------------------------------------------ No results for input(s): CHOL, HDL, LDLCALC, TRIG, CHOLHDL, LDLDIRECT in the last 72 hours.  Lab Results  Component Value Date   HGBA1C 7.1 (H) 06/25/2019    ------------------------------------------------------------------------------------------------------------------ No results for input(s): TSH, T4TOTAL, T3FREE, THYROIDAB in the last 72 hours.  Invalid input(s): FREET3 ------------------------------------------------------------------------------------------------------------------ Recent Labs    06/25/19 0220 06/26/19 0340  FERRITIN 181 169    Coagulation profile No results for input(s): INR, PROTIME in the last 168 hours.  Recent Labs    06/13/2019 2238 06/26/19 0340  DDIMER 2.74* 6.58*    Cardiac Enzymes Recent Labs  Lab 06/14/2019 2238  CKMB 2.5   ------------------------------------------------------------------------------------------------------------------ No results found for: BNP  Micro Results Recent Results (from the past 240 hour(s))  SARS Coronavirus 2 (CEPHEID- Performed in [facility] hospital lab), Hosp Order     Status: Abnormal   Collection Time: 06/19/2019  9:15 PM   Specimen: Nasopharyngeal Swab  Result Value Ref Range Status   SARS Coronavirus 2 POSITIVE (A) NEGATIVE Final    Comment: RESULT CALLED TO, READ BACK BY AND VERIFIED WITH: B OAKLEY,RN @2227  06/21/2019 (NOTE) If result is NEGATIVE SARS-CoV-2 target nucleic acids are NOT DETECTED. The SARS-CoV-2 RNA is generally detectable in upper and lower  respiratory specimens during the acute phase of infection. The lowest  concentration of SARS-CoV-2 viral copies this assay can detect is 250  copies / mL. A negative result does not preclude SARS-CoV-2 infection  and should not be used as the sole basis for treatment or other  patient management decisions.  A negative result may occur with  improper specimen collection / handling, submission of specimen other  than nasopharyngeal swab, presence of viral mutation(s) within the  areas targeted by this assay, and inadequate number of viral copies  (<250 copies / mL). A negative result must be combined  with clinical  observations, patient history, and epidemiological information. If result is POSITIVE SARS-CoV-2 target nucleic acids are DETECTED. The SARS-C oV-2 RNA is generally detectable in upper and lower  respiratory specimens during the acute phase of infection.  Positive  results are indicative of active infection with SARS-CoV-2.  Clinical  correlation with patient history and other diagnostic information is  necessary to determine patient infection status.  Positive results do  not rule out bacterial infection or co-infection with other viruses. If result is PRESUMPTIVE POSTIVE SARS-CoV-2 nucleic acids MAY BE PRESENT.   A presumptive positive result was obtained on the submitted specimen  and confirmed on repeat testing.  While  2019 novel coronavirus  (SARS-CoV-2) nucleic acids may be present in the submitted sample  additional confirmatory testing may be necessary for epidemiological  and / or clinical management purposes  to differentiate between  SARS-CoV-2 and other Sarbecovirus currently known to infect humans.  If clinically indicated additional testing with an alternate test  methodology [phone]) is advised.  The SARS-CoV-2 RNA is generally  detectable in upper and lower respiratory specimens during the acute  phase of infection. The expected result is Negative. Fact Sheet for Patients:  StrictlyIdeas.no Fact Sheet for Healthcare Providers: BankingDealers.co.za This test is not yet approved or cleared by the [place] FDA and has been authorized for detection and/or diagnosis of SARS-CoV-2 by FDA under an Emergency Use Authorization (EUA).  This EUA will remain in effect (meaning this test can be used) for the duration of the COVID-19 declaration under Section 564(b)(1) of the Act, 21 U.S.C. section 360bbb-3(b)(1), unless the authorization is terminated or revoked sooner. Performed at [facility], [address] [place], [place] [address]   Culture, blood (Routine X 2) w Reflex to ID Panel     Status: None (Preliminary result)   Collection Time: 06/28/2019 10:29 PM   Specimen: BLOOD  Result Value Ref Range Status   Specimen Description BLOOD LEFT ANTECUBITAL  Final   Special Requests   Final    BOTTLES DRAWN AEROBIC AND ANAEROBIC Blood Culture adequate volume   Culture   Final    NO GROWTH 3 DAYS Performed at [facility], [address] [place], [place] [address]    Report Status PENDING  Incomplete  Culture, blood (Routine X 2) w Reflex to ID Panel     Status: None (Preliminary result)   Collection Time: 06/18/2019 10:38 PM   Specimen: BLOOD  Result Value Ref Range Status   Specimen Description BLOOD LEFT ANTECUBITAL  Final   Special Requests   Final    BOTTLES DRAWN AEROBIC AND ANAEROBIC Blood Culture adequate volume   Culture   Final    NO GROWTH 3 DAYS Performed at [facility], [address], [place], [place] [address]    Report Status PENDING  Incomplete    Radiology Reports Dg Chest Port 1 View  Result Date: 06/26/2019 CLINICAL DATA:  Shortness of breath EXAM: PORTABLE CHEST 1 VIEW COMPARISON:  June 23, 2019 FINDINGS: Bilateral patchy infiltrates are more pronounced in the interval. The cardiomediastinal silhouette is stable. No pneumothorax. No other abnormalities. IMPRESSION: Bilateral patchy infiltrates are more pronounced in the interval. The findings are most consistent with a multifocal infectious process. No other acute abnormalities identified. Electronically Signed   By: Dorise Bullion III M.D   On: 06/26/2019 08:18   Dg Chest Port 1 View  Result Date: 06/16/2019 CLINICAL DATA:  Weakness EXAM: PORTABLE CHEST 1 VIEW COMPARISON:  04/02/2014 FINDINGS: Cardiac shadow is near the upper limits of normal in size. Aortic calcifications are noted. Patchy infiltrative changes are noted in the mid and lower left lung and right lung base new from the prior exam. No acute bony abnormality  is seen. IMPRESSION: Patchy bilateral infiltrates. Electronically Signed   By: Inez Catalina M.D.   On: 06/22/2019 19:38   US Abdomen Limited Ruq  Result Date: 06/24/2019 CLINICAL DATA:  Elevated liver function studies. Previous cholecystectomy. EXAM: ULTRASOUND ABDOMEN LIMITED RIGHT UPPER QUADRANT COMPARISON:  Report only from abdominal CT 12/18/2007. FINDINGS: Gallbladder: Cholecystectomy. Common bile duct: Diameter: 4 mm. Liver: Partially obscured by bowel gas and overlying colostomy bag. The  liver is mildly heterogeneous in echotexture without focal abnormality. Portal vein is patent on color Doppler imaging with normal direction of blood flow towards the liver. IMPRESSION: 1. No biliary dilatation post cholecystectomy. 2. Nonspecific heterogeneity of the hepatic parenchyma without focal abnormality. Electronically Signed   By: Richardean Sale M.D.   On: 06/24/2019 12:58

## 2019-06-26 NOTE — Progress Notes (Signed)
Pt very agitated, attempting to get out of bed, labored breathing pulling lines and tubes out. Haldol given   1515 no improvement with breathing. Awaiting morphine drip from pharmacy. Pt continues to have labored breathing unable to keep NRB mask on pt. Being pt is comfort care will take NRB mask off as it is increasing agitation  and leave HFNC on. Unsure of current o2 sat as it will not read. Ativan given for agitation.

## 2019-06-26 NOTE — Progress Notes (Signed)
More confused-uncomfortable and more tachypneic than this morning. Had a episode of bradycardia when she was confused and pulled out her O2 out. Spoke with niece Keisha-explained continued deterioration and now with confusion/tachypnea-recommended that we transition to full comfort measures. She is agreeable-she will update rest of the family. Start morphine gtt-see orders.

## 2019-06-26 NOTE — Progress Notes (Signed)
Worsening overnight-with increasing oxygen requirements-now on 100% NRB.  Extensive discussion with POA Thurston Pounds who asked me to call her cousins Varney Biles (who is a NP) [phone] and York Cerise (retired Electrical engineer) [phone] extensive discussion-and the high likelihood of further worsening and the fact that she may not survive this hospitalization-family is agreeable for DNR status.  Varney Biles (is a NP in [place])-felt that family needed some time to come to grips with the situation-we discussed briefly about the use of Actemra-its off label use-risks/benefits in this situation.  Plan is to monitor for clinical improvement-if no improvement-family willing to consider hospice care.

## 2019-06-26 NOTE — Consult Note (Signed)
[place] Nurse ostomy consult on a COVID + patient in [place].  Consult completed remotely. Orders for the following ostomy supplies entered:   2 and 3/4 inch skin barrier Kellie Simmering #2), 2 and 3/4 inch ouch Kellie Simmering [phone]), and barrier rings Kellie Simmering [phone]).  Also, step by step instructions entered for how to cleanse around the stoma and place the barrier ring and skin barrier.   Val Riles, RN, MSN, CWOCN, CNS-BC, pager [phone]

## 2019-06-26 NOTE — Progress Notes (Signed)
Critical ABG results show to MD at bedside. No changes at this time. Ptt remains on NRB. Slight increased WOB, denies SOB. RT will continue to monitor.

## 2019-06-28 LAB — CULTURE, BLOOD (ROUTINE X 2)
Culture: NO GROWTH
Culture: NO GROWTH
Special Requests: ADEQUATE
Special Requests: ADEQUATE

## 2019-06-30 ENCOUNTER — Telehealth: Payer: Self-pay | Admitting: Family Medicine

## 2019-06-30 ENCOUNTER — Other Ambulatory Visit: Payer: Self-pay | Admitting: Orthopedic Surgery

## 2019-06-30 ENCOUNTER — Telehealth: Payer: Self-pay

## 2019-06-30 NOTE — Telephone Encounter (Signed)
[name] is calling to get pain pills 7.5-325, however I show that the pt is deceased. I have reported this to my supervisor.

## 2019-06-30 NOTE — Telephone Encounter (Signed)
I called [employer] and reported that Darden Amber tried to get Hydro 7.5 / 325 refilled on this patient that has passed away.

## 2019-06-30 NOTE — Telephone Encounter (Signed)
Reported to [place]

## 2019-07-02 ENCOUNTER — Ambulatory Visit (HOSPITAL_COMMUNITY): Payer: Medicare HMO | Admitting: Occupational Therapy

## 2019-07-03 ENCOUNTER — Telehealth: Payer: Self-pay

## 2019-07-03 NOTE — Telephone Encounter (Signed)
Patient's nephew called and inquired that he needed to get his aunts pain medication because she was out of medication. I reported this to my supervisor.

## 2019-07-03 NOTE — Telephone Encounter (Signed)
Patient's nephew, Annie Main called back and said that he just found out his aunt passed away and to disregard anything he said about the refill.

## 2019-07-03 NOTE — Telephone Encounter (Signed)
[name] nephew of the patient has called several times today and this past week trying to get narcotic medication for his aunt that has passed away.  This has ben reported to [employer] 2 times.  We talked with Elliot Gault today. Report # [id number]  [employer] [phone]

## 2019-07-04 ENCOUNTER — Ambulatory Visit (HOSPITAL_COMMUNITY): Payer: Medicare HMO

## 2019-07-05 NOTE — Progress Notes (Signed)
75 cc of morphine gtt wasted in stericycle with Rita Ohara RN and Julio Alm RN.  Pharmacy notified.  Spoke with Merrilee Seashore.

## 2019-07-05 NOTE — Death Summary Note (Signed)
DEATH SUMMARY   Patient Details  Name: [name] MRN: [id number] DOB: [birth year]  Admission/Discharge Information   Admit Date:  06-30-2019  Date of Death: Date of Death: Jul 04, 2019  Time of Death: Time of Death: 0155  Length of Stay: 4  Referring Physician: Lucianne Lei, MD   Reason(s) for Hospitalization  Acute hypoxic respiratory failure from COVID-19 pneumonia  Diagnoses  Preliminary cause of death:   Principal Problem: Acute hypoxic respiratory failure from COVID-19 pneumonia Active Problems:   Abnormal liver function   Protein-calorie malnutrition, severe ([place])   Acute renal failure superimposed on stage 4 chronic kidney disease ([place])   Anemia   Brief Hospital Course (including significant findings, care, treatment, and services provided and events leading to death)   Patient wass a 85 y.o. female with PMHx of HTN, DM-2, CKD stage IV, hypothyroidism, PAD s/p left femoral-popliteal bypass in 2009-03-01, dementia-presented with generalized weakness-found to have acute hypoxic respiratory failure in the setting of COVID-19 viral pneumonia.  Patient was started on steroids, Remdesivir-unfortunately after initial improvement-she decompensated with worsening hypoxemia requiring 100% nonrebreather mask.  After multiple discussions with numerous family members-due to her poor overall prognosis/frailty-she was transitioned to full comfort measures.  Patient was started on a morphine drip-and subsequently passed away on 07/04/2023 at 0155 hours.  Pertinent Labs and Studies  Significant Diagnostic Studies Dg Chest Port 1 View  Result Date: 06/26/2019 CLINICAL DATA:  Shortness of breath EXAM: PORTABLE CHEST 1 VIEW COMPARISON:  06/30/19 FINDINGS: Bilateral patchy infiltrates are more pronounced in the interval. The cardiomediastinal silhouette is stable. No pneumothorax. No other abnormalities. IMPRESSION: Bilateral patchy infiltrates are more pronounced in the interval. The findings are  most consistent with a multifocal infectious process. No other acute abnormalities identified. Electronically Signed   By: Dorise Bullion III M.D   On: 06/26/2019 08:18   Dg Chest Port 1 View  Result Date: 06-30-2019 CLINICAL DATA:  Weakness EXAM: PORTABLE CHEST 1 VIEW COMPARISON:  04/02/2014 FINDINGS: Cardiac shadow is near the upper limits of normal in size. Aortic calcifications are noted. Patchy infiltrative changes are noted in the mid and lower left lung and right lung base new from the prior exam. No acute bony abnormality is seen. IMPRESSION: Patchy bilateral infiltrates. Electronically Signed   By: Inez Catalina M.D.   On: 2019-06-30 19:38   US Abdomen Limited Ruq  Result Date: 06/24/2019 CLINICAL DATA:  Elevated liver function studies. Previous cholecystectomy. EXAM: ULTRASOUND ABDOMEN LIMITED RIGHT UPPER QUADRANT COMPARISON:  Report only from abdominal CT 12/18/2007. FINDINGS: Gallbladder: Cholecystectomy. Common bile duct: Diameter: 4 mm. Liver: Partially obscured by bowel gas and overlying colostomy bag. The liver is mildly heterogeneous in echotexture without focal abnormality. Portal vein is patent on color Doppler imaging with normal direction of blood flow towards the liver. IMPRESSION: 1. No biliary dilatation post cholecystectomy. 2. Nonspecific heterogeneity of the hepatic parenchyma without focal abnormality. Electronically Signed   By: Richardean Sale M.D.   On: 06/24/2019 12:58    Microbiology Recent Results (from the past 240 hour(s))  SARS Coronavirus 2 (CEPHEID- Performed in [facility] hospital lab), Hosp Order     Status: Abnormal   Collection Time: 2019/06/30  9:15 PM   Specimen: Nasopharyngeal Swab  Result Value Ref Range Status   SARS Coronavirus 2 POSITIVE (A) NEGATIVE Final    Comment: RESULT CALLED TO, READ BACK BY AND VERIFIED WITH: B OAKLEY,RN @2227  2019/06/30 (NOTE) If result is NEGATIVE SARS-CoV-2 target nucleic acids are NOT DETECTED.  The SARS-CoV-2 RNA is  generally detectable in upper and lower  respiratory specimens during the acute phase of infection. The lowest  concentration of SARS-CoV-2 viral copies this assay can detect is 250  copies / mL. A negative result does not preclude SARS-CoV-2 infection  and should not be used as the sole basis for treatment or other  patient management decisions.  A negative result may occur with  improper specimen collection / handling, submission of specimen other  than nasopharyngeal swab, presence of viral mutation(s) within the  areas targeted by this assay, and inadequate number of viral copies  (<250 copies / mL). A negative result must be combined with clinical  observations, patient history, and epidemiological information. If result is POSITIVE SARS-CoV-2 target nucleic acids are DETECTED. The SARS-C oV-2 RNA is generally detectable in upper and lower  respiratory specimens during the acute phase of infection.  Positive  results are indicative of active infection with SARS-CoV-2.  Clinical  correlation with patient history and other diagnostic information is  necessary to determine patient infection status.  Positive results do  not rule out bacterial infection or co-infection with other viruses. If result is PRESUMPTIVE POSTIVE SARS-CoV-2 nucleic acids MAY BE PRESENT.   A presumptive positive result was obtained on the submitted specimen  and confirmed on repeat testing.  While 2019 novel coronavirus  (SARS-CoV-2) nucleic acids may be present in the submitted sample  additional confirmatory testing may be necessary for epidemiological  and / or clinical management purposes  to differentiate between  SARS-CoV-2 and other Sarbecovirus currently known to infect humans.  If clinically indicated additional testing with an alternate test  methodology [phone]) is advised.  The SARS-CoV-2 RNA is generally  detectable in upper and lower respiratory specimens during the acute  phase of  infection. The expected result is Negative. Fact Sheet for Patients:  StrictlyIdeas.no Fact Sheet for Healthcare Providers: BankingDealers.co.za This test is not yet approved or cleared by the [place] FDA and has been authorized for detection and/or diagnosis of SARS-CoV-2 by FDA under an Emergency Use Authorization (EUA).  This EUA will remain in effect (meaning this test can be used) for the duration of the COVID-19 declaration under Section 564(b)(1) of the Act, 21 U.S.C. section 360bbb-3(b)(1), unless the authorization is terminated or revoked sooner. Performed at [facility], [address] [place], [place] [address]   Culture, blood (Routine X 2) w Reflex to ID Panel     Status: None   Collection Time: 07/03/2019 10:29 PM   Specimen: BLOOD  Result Value Ref Range Status   Specimen Description BLOOD LEFT ANTECUBITAL  Final   Special Requests   Final    BOTTLES DRAWN AEROBIC AND ANAEROBIC Blood Culture adequate volume   Culture   Final    NO GROWTH 5 DAYS Performed at [facility], [address], [place], [place] [address]    Report Status 06/28/2019 FINAL  Final  Culture, blood (Routine X 2) w Reflex to ID Panel     Status: None   Collection Time: 07/04/2019 10:38 PM   Specimen: BLOOD  Result Value Ref Range Status   Specimen Description BLOOD LEFT ANTECUBITAL  Final   Special Requests   Final    BOTTLES DRAWN AEROBIC AND ANAEROBIC Blood Culture adequate volume   Culture   Final    NO GROWTH 5 DAYS Performed at [facility], [address] [place], [place] [address]    Report Status 06/28/2019 FINAL  Final  Lab Basic Metabolic Panel: Recent Labs  Lab 07/04/2019 1732 06/24/19 8416 06/25/19 0220 06/26/19 0340 06/26/19 0751  NA 140 140 143 149* 148*  K 5.0 5.3* 5.5* 5.8* 5.0  CL 113* 117* 118* 121*  --   CO2 19* 13* 16* 15*  --   GLUCOSE 149* 181* 165* 174*  --   BUN 43* 40* 41* 46*  --   CREATININE 2.58* 2.08*  1.77* 1.69*  --   CALCIUM 7.8* 7.6* 7.7* 8.2*  --    Liver Function Tests: Recent Labs  Lab 06/22/2019 1732 06/24/19 0638 06/25/19 0220 06/26/19 0340  AST 58* 44* 43* 44*  ALT 30 25 23 24   ALKPHOS 61 54 51 65  BILITOT 0.4 0.4 0.2* 0.3  PROT 7.4 6.5 6.3* 7.0  ALBUMIN 2.8* 2.5* 2.3* 2.6*   Recent Labs  Lab 06/14/2019 1732  LIPASE 33   No results for input(s): AMMONIA in the last 168 hours. CBC: Recent Labs  Lab 06/12/2019 1732 06/24/19 0638 06/25/19 0220 06/26/19 0340 06/26/19 0751  WBC 7.4 5.5 11.6* 15.5*  --   NEUTROABS  --  5.0  --   --   --   HGB 10.1* 9.5* 9.5* 11.1* 11.2*  HCT 34.4* 32.1* 31.4* 36.3 33.0*  MCV 85.8 85.1 84.6 83.3  --   PLT 290 293 339 377  --    Cardiac Enzymes: Recent Labs  Lab 06/24/2019 2238  CKTOTAL 395*  CKMB 2.5   Sepsis Labs: Recent Labs  Lab 06/25/2019 1732 07/04/2019 2238 06/24/19 0638 06/24/19 0843 06/25/19 0220 06/26/19 0340  PROCALCITON  --   --   --  0.30  --  0.57  WBC 7.4  --  5.5  --  11.6* 15.5*  LATICACIDVEN  --  0.9  --   --   --   --        Evalee Mutton Ghimire 06/28/2019, 12:51 PM

## 2019-07-05 NOTE — Progress Notes (Signed)
Pt take to morgue by Hermina Barters RN

## 2019-07-05 NOTE — Progress Notes (Signed)
Pt expired.  No respiration or heart rate noted. Verified by Rita Ohara RN and Julio Alm RN.  Dr Bridgett Larsson notified by Hermina Barters RN.

## 2019-07-05 DEATH — deceased

## 2019-07-08 ENCOUNTER — Ambulatory Visit (HOSPITAL_COMMUNITY): Payer: Medicare HMO | Admitting: Occupational Therapy

## 2019-07-08 ENCOUNTER — Ambulatory Visit: Payer: Medicare Other | Admitting: Family

## 2019-07-08 ENCOUNTER — Encounter (HOSPITAL_COMMUNITY): Payer: Medicare Other

## 2019-07-11 ENCOUNTER — Encounter (HOSPITAL_COMMUNITY): Payer: Medicare HMO

## 2019-07-14 ENCOUNTER — Ambulatory Visit: Payer: Medicare Other | Admitting: Family

## 2019-07-15 ENCOUNTER — Encounter (HOSPITAL_COMMUNITY): Payer: Medicare HMO | Admitting: Occupational Therapy

## 2019-07-18 ENCOUNTER — Encounter (HOSPITAL_COMMUNITY): Payer: Medicare HMO

## 2019-08-08 ENCOUNTER — Ambulatory Visit: Payer: Medicare Other | Admitting: Orthopedic Surgery

## 2020-04-13 NOTE — Telephone Encounter (Signed)
Can you please refuse medication and sign this encounter?  I do not have clearance to do so, thanks.
